# Patient Record
Sex: Female | Born: 2000 | Race: Black or African American | Hispanic: No | Marital: Single | Smoking: Never smoker
Health system: Southern US, Community
[De-identification: ages and names within clinical notes are randomized; demographics above are authoritative.]

## PROBLEM LIST (undated history)

## (undated) DIAGNOSIS — D649 Anemia, unspecified: Secondary | ICD-10-CM

## (undated) HISTORY — PX: TONSILLECTOMY: SUR1361

---

## 2020-03-06 DIAGNOSIS — Z1388 Encounter for screening for disorder due to exposure to contaminants: Secondary | ICD-10-CM | POA: Diagnosis not present

## 2020-03-06 DIAGNOSIS — Z331 Pregnant state, incidental: Secondary | ICD-10-CM | POA: Diagnosis not present

## 2020-03-06 DIAGNOSIS — Z0389 Encounter for observation for other suspected diseases and conditions ruled out: Secondary | ICD-10-CM | POA: Diagnosis not present

## 2020-03-06 DIAGNOSIS — Z049 Encounter for examination and observation for unspecified reason: Secondary | ICD-10-CM | POA: Diagnosis not present

## 2020-03-06 DIAGNOSIS — Z23 Encounter for immunization: Secondary | ICD-10-CM | POA: Diagnosis not present

## 2020-03-06 DIAGNOSIS — Z3009 Encounter for other general counseling and advice on contraception: Secondary | ICD-10-CM | POA: Diagnosis not present

## 2020-03-06 DIAGNOSIS — Z114 Encounter for screening for human immunodeficiency virus [HIV]: Secondary | ICD-10-CM | POA: Diagnosis not present

## 2020-03-31 ENCOUNTER — Encounter: Payer: Self-pay | Admitting: Family Medicine

## 2020-03-31 DIAGNOSIS — Z0289 Encounter for other administrative examinations: Secondary | ICD-10-CM | POA: Insufficient documentation

## 2020-04-01 ENCOUNTER — Other Ambulatory Visit: Payer: Self-pay

## 2020-04-01 ENCOUNTER — Encounter: Payer: Self-pay | Admitting: Family Medicine

## 2020-04-01 ENCOUNTER — Ambulatory Visit (INDEPENDENT_AMBULATORY_CARE_PROVIDER_SITE_OTHER): Payer: Medicaid Other | Admitting: Family Medicine

## 2020-04-01 VITALS — BP 100/64 | HR 69 | Ht 65.75 in | Wt 133.0 lb

## 2020-04-01 DIAGNOSIS — Z0289 Encounter for other administrative examinations: Secondary | ICD-10-CM

## 2020-04-01 NOTE — Progress Notes (Signed)
  Patient Name: Jacqueline Miranda Date of Birth: 11-25-2000 Date of Visit: 04/01/20 PCP: Evelena Leyden, DO  Chief Complaint: refugee intake examination and  toothache for >6 months, "left eye problem" (was previously seeing eye doctor and had corneal infection/inflammation that needed follow-up. She was was being treated with eye drops and tablet, but meds were changed every week.), leg (pain with standing or sitting for extended periods in both legs byt R>L)  The patient's preferred language is [place]. An interpreter was used for the entire visit.  Interpreter Name or ID: [id number]    Subjective: Jacqueline Miranda is a pleasant 19 y.o. presenting today for an initial refugee and immigrant clinic visit. She has no acute concerns today. She reports she failed her vision screen at the health department.    ROS: Negative other than HPI  PMH:  "Eye problem" Denies malaria or hospitalizations  PSH: Tonsillectomy    FH: Father has Hepatitis B  Allergies:  None  Current Medications:  None   Social History: Tobacco Use: Deferred for next exam Alcohol Use: Deferred for next exam  Refugee Information Number of Immediate Family Members: 8 Number of Immediate Family Members in [place]: 8 Date of Arrival: 02/12/20 Country of Birth: [place] Other Country of Origin:: [place] Location of Refugee Camp: Other Other Location of Refugee Camp:: [place] Duration in [place]: 11-15 years Reason for Leaving Home Country: Religion Primary Language: Other Other Primary Language:: [place] Able to Read in Primary Language: No Able to Write in Primary Language: No Education: Primary School Marital Status: Single Tuberculosis Screening Overseas: Negative Health Department Labs Completed: Yes Do You Feel Jumpy or Nervous?: No Are You Very Watchful or 'Super Alert'?: No Not able to read and write in preferred language. Read and write in English and arabic some.  Date of Overseas Exam: 08/15/2019 Review of Overseas Exam:  04/01/2020 Pre-Departure Treatment: Albendazole, PZQ, Ivermectin  Overseas Vaccines Reviewed and Updated in Epic 04/01/2020    Vitals:   04/01/20 1436  BP: 100/64  Pulse: 69  SpO2: 100%   HEENT: Sclera anicteric. Dentition is mildly poor with possible cavities. Appears well hydrated. Neck: Supple Cardiac: Regular rate and rhythm. Normal S1/S2. No murmurs, rubs, or gallops appreciated. Lungs: Clear bilaterally to ascultation.  Abdomen: Normoactive bowel sounds. No tenderness to deep or light palpation. No rebound or guarding. Splenomegaly not app  Extremities: Warm, well perfused without edema.  Skin: no obvious rashes or lesions  Psych: Pleasant and appropriate  MSK: no muscle wasting, appropriate muscular tone   Refugee health examination At next visit address: - Social history - "Eye problem", consider optometry referral - Leg pain - Toothache  Referral placed to Optometry today--April to call to schedule optometry appointment. Will discuss time and date at follow up (Mom to have appointment on same day) Labs pended for next visit- please make sure she obtains these Will need Pap smear in next few months   Music therapist signed with agency Yes.   Release of information signed for Health Department Yes.   Return to care in 1 month in [facility] with resident physician and PCP .   Vaccines: Updated  I discussed the plan of care with the resident physician and agree with below documentation.  Terisa Starr, MD

## 2020-04-01 NOTE — Patient Instructions (Addendum)
It was wonderful to see you today.  Please bring ALL of your medications with you to every visit.     Follow up on November 12th at 3PM    Thank you for choosing [facility].   Please call [phone] with any questions about today's appointment.  Please be sure to schedule follow up at the front  desk before you leave today.   Evelena Leyden, DO  Family Medicine

## 2020-04-01 NOTE — Assessment & Plan Note (Signed)
At next visit address: - Social history - "Eye problem", consider optometry referral - Leg pain - Toothache

## 2020-04-11 ENCOUNTER — Ambulatory Visit: Payer: Self-pay | Admitting: Family Medicine

## 2020-04-11 ENCOUNTER — Telehealth: Payer: Self-pay | Admitting: Family Medicine

## 2020-04-11 NOTE — Telephone Encounter (Signed)
Attempted to call patient with a phone interpreter. Interpreter was getting an error message on the phone numbers [phone], [phone], and [phone]. Spoke with Dr. Brown who is reaching out to the case manager to have the patient's confirm an appropriate phone number and reschedule for refugee labs and follow-up. ° °Jacqueline Kernodle, DO ° °

## 2020-04-21 ENCOUNTER — Ambulatory Visit (INDEPENDENT_AMBULATORY_CARE_PROVIDER_SITE_OTHER): Payer: Medicaid Other | Admitting: Family Medicine

## 2020-04-21 ENCOUNTER — Other Ambulatory Visit: Payer: Self-pay | Admitting: Family Medicine

## 2020-04-21 ENCOUNTER — Other Ambulatory Visit (HOSPITAL_COMMUNITY): Payer: Self-pay | Admitting: Family Medicine

## 2020-04-21 ENCOUNTER — Other Ambulatory Visit: Payer: Self-pay

## 2020-04-21 ENCOUNTER — Encounter: Payer: Self-pay | Admitting: Family Medicine

## 2020-04-21 VITALS — BP 106/72 | HR 100 | Ht 66.0 in | Wt 135.0 lb

## 2020-04-21 DIAGNOSIS — H538 Other visual disturbances: Secondary | ICD-10-CM | POA: Diagnosis not present

## 2020-04-21 DIAGNOSIS — M79604 Pain in right leg: Secondary | ICD-10-CM | POA: Diagnosis not present

## 2020-04-21 DIAGNOSIS — H539 Unspecified visual disturbance: Secondary | ICD-10-CM | POA: Insufficient documentation

## 2020-04-21 DIAGNOSIS — K0889 Other specified disorders of teeth and supporting structures: Secondary | ICD-10-CM | POA: Insufficient documentation

## 2020-04-21 DIAGNOSIS — M79605 Pain in left leg: Secondary | ICD-10-CM | POA: Diagnosis not present

## 2020-04-21 DIAGNOSIS — Z3490 Encounter for supervision of normal pregnancy, unspecified, unspecified trimester: Secondary | ICD-10-CM | POA: Diagnosis not present

## 2020-04-21 DIAGNOSIS — Z3400 Encounter for supervision of normal first pregnancy, unspecified trimester: Secondary | ICD-10-CM | POA: Insufficient documentation

## 2020-04-21 DIAGNOSIS — Z0289 Encounter for other administrative examinations: Secondary | ICD-10-CM

## 2020-04-21 HISTORY — DX: Other specified disorders of teeth and supporting structures: K08.89

## 2020-04-21 LAB — POCT URINALYSIS DIP (MANUAL ENTRY)
Bilirubin, UA: NEGATIVE
Blood, UA: NEGATIVE
Glucose, UA: 100 mg/dL — AB
Ketones, POC UA: NEGATIVE mg/dL
Nitrite, UA: NEGATIVE
Protein Ur, POC: NEGATIVE mg/dL
Spec Grav, UA: 1.02 (ref 1.010–1.025)
Urobilinogen, UA: 0.2 E.U./dL
pH, UA: 5.5 (ref 5.0–8.0)

## 2020-04-21 LAB — POCT URINE PREGNANCY: Preg Test, Ur: POSITIVE — AB

## 2020-04-21 LAB — POCT UA - MICROSCOPIC ONLY: Epithelial cells, urine per micros: >20

## 2020-04-21 MED ORDER — PRENATAL COMPLETE 14-0.4 MG PO TABS: 1.0000 | ORAL_TABLET | Freq: Every day | ORAL | 5 refills | Status: DC

## 2020-04-21 NOTE — Assessment & Plan Note (Signed)
>>  ASSESSMENT AND PLAN FOR BLURRED VISION WRITTEN ON 04/21/2020  4:01 PM BY Bud Care, DO  History of blurred vision since August.  Patient took unknown eyedrops and oral medications. -Patient given optometry resources.

## 2020-04-21 NOTE — Assessment & Plan Note (Signed)
Likely MSK in origin, with no decrease in ROM of bilateral LE.  Possible diagnoses include piriformis syndrome, gluteal weakness, SI joint dysfunction. -We will need more follow-up at next visit.  Unable to delve into further considerations with patient. -Consider further in-depth MSK evaluation with gait testing as well. -Follow-up within the next month.

## 2020-04-21 NOTE — Patient Instructions (Addendum)
It was so great seeing you today!  Today we discussed the following:  Leg pain: For your leg pain, we will have you come back within the next month to determine if there are exercises or other treatments that would be appropriate.  Vision problems: Very blurred vision, we have given you an optometrist list to have your eyes evaluated.  Toothache: We have provided you with a list of dentists that take Medicaid.  Positive pregnancy test: Your pregnancy test was positive today.  Ultrasound confirmed that there is a fetus present.  We have set you up with an ultrasound to determine how far along you are on December 3.  We have also given you the resources requested for consideration of termination.  In [place] termination cannot be completed after 20 weeks, and will depend on what the dating ultrasound to determine the options.  I want to see you back in the office for your appointment on December 9 at 2:30 PM to check up and see how everything is going.  We are prescribing a prenatal vitamin, please take this every single day.

## 2020-04-21 NOTE — Assessment & Plan Note (Signed)
LMP in August 21.  Unexpected pregnancy, patient voices desire for termination if possible.  Family unaware of the situation, patient states she will not be telling them.  Intrauterine gestation confirmed with office [place], fundal height below the level of the umbilicus. -Prescribed daily prenatal vitamin -Dating ultrasound appointment 12/3 -Patient given information for contact for dating ultrasound and termination services per request -Family unaware, patient currently wishes for them to remain so

## 2020-04-21 NOTE — Assessment & Plan Note (Signed)
Getting labs today as they were unable to at prior appointment.

## 2020-04-21 NOTE — Progress Notes (Signed)
SUBJECTIVE:   Interpreter [id number]  CHIEF COMPLAINT / HPI:   Phone number [phone].  Refugee follow-up: Labs to be collected today as patient was unable to obtain these labs at prior visit.  Leg pain: Patient reports 3 years of bilateral leg pain (more in the right) that occurs most days.  She describes it as a "stretching pain" that occurs whenever she is walking or doing work for more than 15 minutes.  Patient has never seek treatment for this and does not take any OTC medications.  Patient reports that it does limit her walking.  Toothache: Patient has a prior history of cavities with fillings 2 years ago.  Has not seen a dentist since she is arrived to the country, was previously seen in [place].  Vision problems: Patient reports some blurry vision that began in August.  Patient was previously taking unknown eyedrops and a reported oral medication that helped the blurriness for 1 month while in [place].  She is unsure what her medications were.    Positive pregnancy test: While in the room with CMA and sister, patient stated that her last period was on 11/10.  Once alone in the room, patient stated that her last period was in August (she states she typically has monthly cycles), though denied any sexual intercourse.  Patient urinary pregnancy test came back positive, then patient admitted to having prior relations while still living in [place].  She states her only sexual partner is still living in [place].  Her family is unaware of her situation, she was nervous that she could possibly have been pregnant but had not been tested yet.  She states that she has been having issues with nausea and vomiting, denies vaginal bleeding leakage of fluid, contractions/cramping patient states that the pregnancy was not wanted, and she thinks that she would like to have termination if possible.   PERTINENT  PMH / PSH: Reviewed  OBJECTIVE:   BP 106/72   Pulse 100   Ht 5\' 6"  (1.676 m)   Wt 135 lb  (61.2 kg)   LMP August 2021  SpO2 99%   BMI 21.79 kg/m   Gen: NAD, initially sitting in the room with sister. HEENT: Poor dentition, caries noted, prior fillings noted. MSK: B/L LE: Full ROM, mild knee pain with internal rotation of hip, no tenderness to palpation, no crepitus appreciated in joints. Abdomen: Fundal height right below the level of the umbilicus, point-of-care ultrasound confirmed intrauterine gestation.  ASSESSMENT/PLAN:   Refugee health examination Getting labs today as they were unable to at prior appointment.  Pregnancy LMP in August 21.  Unexpected pregnancy, patient voices desire for termination if possible.  Family unaware of the situation, patient states she will not be telling them.  Intrauterine gestation confirmed with office August 23, fundal height below the level of the umbilicus. -Prescribed daily prenatal vitamin -Dating ultrasound appointment 12/3 -Patient given information for contact for dating ultrasound and termination services per request -Family unaware, patient currently wishes for them to remain so  Toothache History of dental caries with fillings.  Patient was given a list of local dentist that take Medicaid.  Leg pain, bilateral Likely MSK in origin, with no decrease in ROM of bilateral LE.  Possible diagnoses include piriformis syndrome, gluteal weakness, SI joint dysfunction. -We will need more follow-up at next visit.  Unable to delve into further considerations with patient. -Consider further in-depth MSK evaluation with gait testing as well. -Follow-up within the next month.  Blurred vision History  of blurred vision since August.  Patient took unknown eyedrops and oral medications. -Patient given optometry resources.     Jacqueline Leyden, DO [facility]

## 2020-04-21 NOTE — Assessment & Plan Note (Signed)
History of blurred vision since August.  Patient took unknown eyedrops and oral medications. -Patient given optometry resources.

## 2020-04-21 NOTE — Assessment & Plan Note (Signed)
History of dental caries with fillings.  Patient was given a list of local dentist that take Medicaid.

## 2020-04-22 LAB — COMPREHENSIVE METABOLIC PANEL
ALT: 24 IU/L (ref 0–32)
AST: 30 IU/L (ref 0–40)
Albumin/Globulin Ratio: 1.4 (ref 1.2–2.2)
Albumin: 4.2 g/dL (ref 3.9–5.0)
Alkaline Phosphatase: 89 IU/L (ref 44–121)
BUN/Creatinine Ratio: 11 (ref 9–23)
BUN: 6 mg/dL (ref 6–20)
Bilirubin Total: 0.3 mg/dL (ref 0.0–1.2)
CO2: 20 mmol/L (ref 20–29)
Calcium: 9.6 mg/dL (ref 8.7–10.2)
Chloride: 100 mmol/L (ref 96–106)
Creatinine, Ser: 0.53 mg/dL — ABNORMAL LOW (ref 0.57–1.00)
GFR calc Af Amer: 157 mL/min/{1.73_m2} (ref >59)
GFR calc non Af Amer: 136 mL/min/{1.73_m2} (ref >59)
Globulin, Total: 3 g/dL (ref 1.5–4.5)
Glucose: 78 mg/dL (ref 65–99)
Potassium: 4.4 mmol/L (ref 3.5–5.2)
Sodium: 135 mmol/L (ref 134–144)
Total Protein: 7.2 g/dL (ref 6.0–8.5)

## 2020-04-22 LAB — CBC WITH DIFFERENTIAL/PLATELET
Basophils Absolute: 0 10*3/uL (ref 0.0–0.2)
Basos: 0 %
EOS (ABSOLUTE): 0.7 10*3/uL — ABNORMAL HIGH (ref 0.0–0.4)
Eos: 6 %
Hematocrit: 30.7 % — ABNORMAL LOW (ref 34.0–46.6)
Hemoglobin: 10.6 g/dL — ABNORMAL LOW (ref 11.1–15.9)
Immature Grans (Abs): 0.1 10*3/uL (ref 0.0–0.1)
Immature Granulocytes: 1 %
Lymphocytes Absolute: 2 10*3/uL (ref 0.7–3.1)
Lymphs: 17 %
MCH: 27.4 pg (ref 26.6–33.0)
MCHC: 34.5 g/dL (ref 31.5–35.7)
MCV: 79 fL (ref 79–97)
Monocytes Absolute: 1.1 10*3/uL — ABNORMAL HIGH (ref 0.1–0.9)
Monocytes: 9 %
Neutrophils Absolute: 7.7 10*3/uL — ABNORMAL HIGH (ref 1.4–7.0)
Neutrophils: 67 %
Platelets: 287 10*3/uL (ref 150–450)
RBC: 3.87 x10E6/uL (ref 3.77–5.28)
RDW: 14.9 % (ref 11.7–15.4)
WBC: 11.6 10*3/uL — ABNORMAL HIGH (ref 3.4–10.8)

## 2020-04-22 LAB — HEPATITIS B SURFACE ANTIGEN: Hepatitis B Surface Ag: NEGATIVE

## 2020-04-22 LAB — HIV ANTIBODY (ROUTINE TESTING W REFLEX): HIV Screen 4th Generation wRfx: NONREACTIVE

## 2020-04-22 LAB — HEPATITIS B CORE ANTIBODY, TOTAL: Hep B Core Total Ab: POSITIVE — AB

## 2020-04-22 LAB — LIPID PANEL
Chol/HDL Ratio: 2.2 ratio (ref 0.0–4.4)
Cholesterol, Total: 184 mg/dL (ref 100–199)
HDL: 84 mg/dL (ref >39)
LDL Chol Calc (NIH): 83 mg/dL (ref 0–99)
Triglycerides: 99 mg/dL (ref 0–149)
VLDL Cholesterol Cal: 17 mg/dL (ref 5–40)

## 2020-04-22 LAB — HEPATITIS B SURFACE ANTIBODY, QUANTITATIVE: Hepatitis B Surf Ab Quant: 486.5 m[IU]/mL (ref >9.9)

## 2020-04-22 LAB — HCV INTERPRETATION

## 2020-04-22 LAB — VARICELLA ZOSTER ANTIBODY, IGG: Varicella zoster IgG: 3347 index (ref >165)

## 2020-04-22 LAB — HCV AB W REFLEX TO QUANT PCR: HCV Ab: <0.1 s/co ratio (ref 0.0–0.9)

## 2020-04-22 LAB — RPR: RPR Ser Ql: NONREACTIVE

## 2020-04-22 LAB — TSH: TSH: 1.65 u[IU]/mL (ref 0.450–4.500)

## 2020-05-02 ENCOUNTER — Other Ambulatory Visit: Payer: Self-pay | Admitting: *Deleted

## 2020-05-02 ENCOUNTER — Other Ambulatory Visit: Payer: Self-pay

## 2020-05-02 ENCOUNTER — Other Ambulatory Visit: Payer: Self-pay | Admitting: Family Medicine

## 2020-05-02 ENCOUNTER — Ambulatory Visit
Admission: RE | Admit: 2020-05-02 | Discharge: 2020-05-02 | Disposition: A | Discharge status: Home / Self Care | Payer: Medicaid Other | Source: Ambulatory Visit | Attending: Family Medicine | Admitting: Family Medicine

## 2020-05-02 ENCOUNTER — Telehealth: Payer: Self-pay | Admitting: Family Medicine

## 2020-05-02 ENCOUNTER — Ambulatory Visit (HOSPITAL_BASED_OUTPATIENT_CLINIC_OR_DEPARTMENT_OTHER): Payer: Medicaid Other

## 2020-05-02 DIAGNOSIS — Z363 Encounter for antenatal screening for malformations: Secondary | ICD-10-CM

## 2020-05-02 DIAGNOSIS — F5089 Other specified eating disorder: Secondary | ICD-10-CM

## 2020-05-02 DIAGNOSIS — Z3687 Encounter for antenatal screening for uncertain dates: Secondary | ICD-10-CM | POA: Diagnosis not present

## 2020-05-02 DIAGNOSIS — Z3A18 18 weeks gestation of pregnancy: Secondary | ICD-10-CM

## 2020-05-02 DIAGNOSIS — Z3490 Encounter for supervision of normal pregnancy, unspecified, unspecified trimester: Secondary | ICD-10-CM | POA: Diagnosis not present

## 2020-05-02 DIAGNOSIS — O0930 Supervision of pregnancy with insufficient antenatal care, unspecified trimester: Secondary | ICD-10-CM

## 2020-05-02 IMAGING — US US MFM OB COMP +14 WKS
2 series · 13 of 28 positions shown · non-contrast
Comparison: none

[Series 1: us mfm ob comp +14 wks · 12 of 102 slices shown (1 of 2)]
[im 5/102]
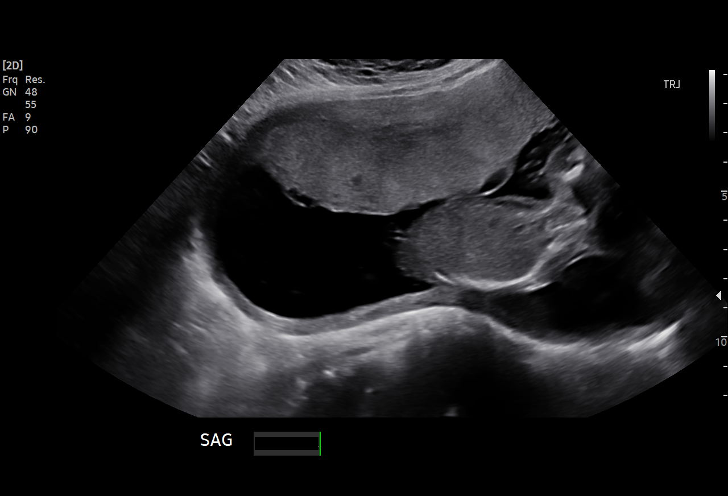
[im 13/102]
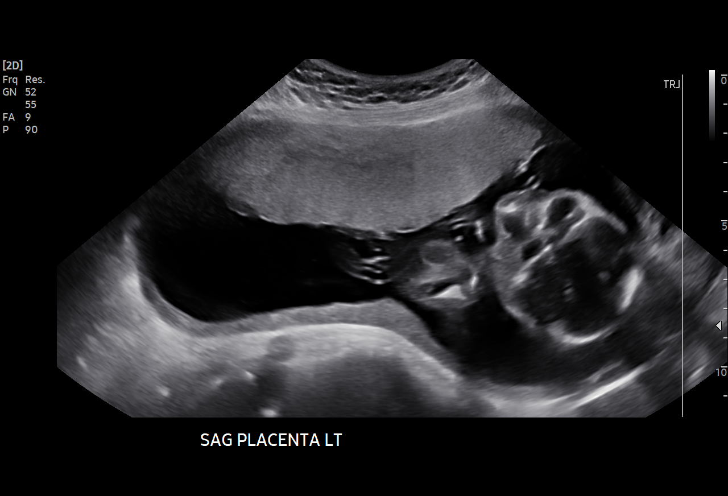
[im 21/102]
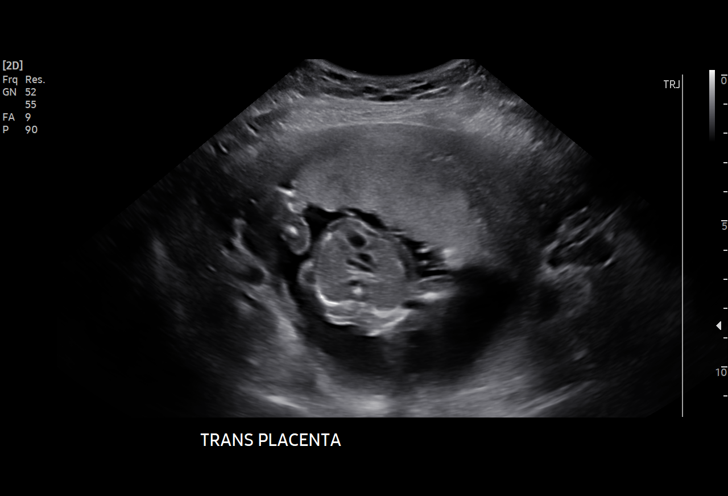
[im 29/102]
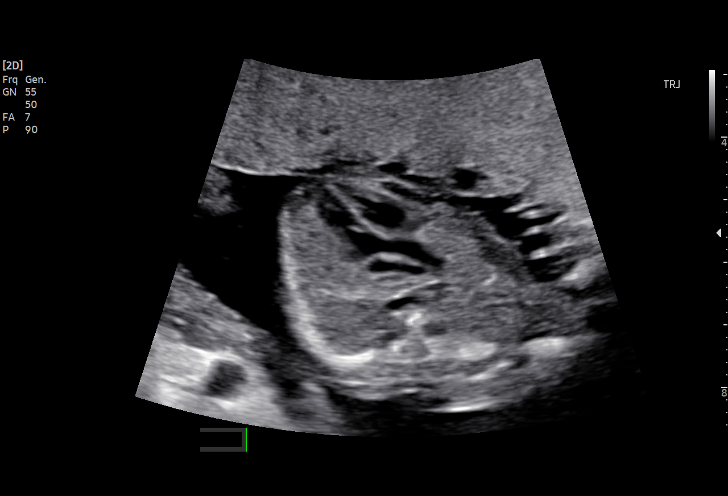
[im 37/102]
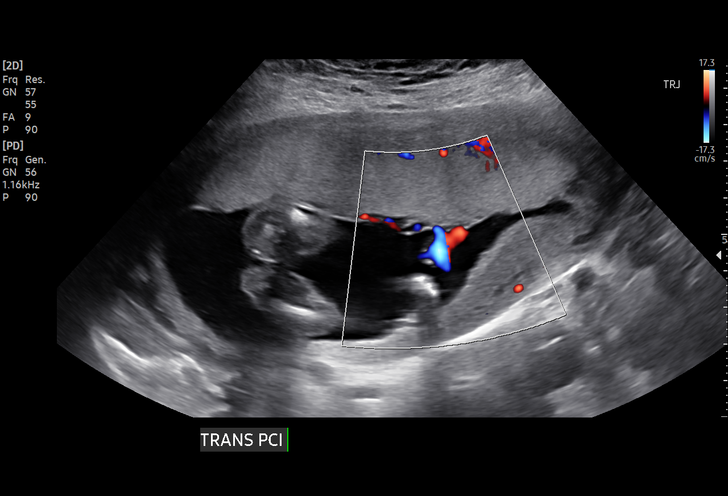
[im 45/102]
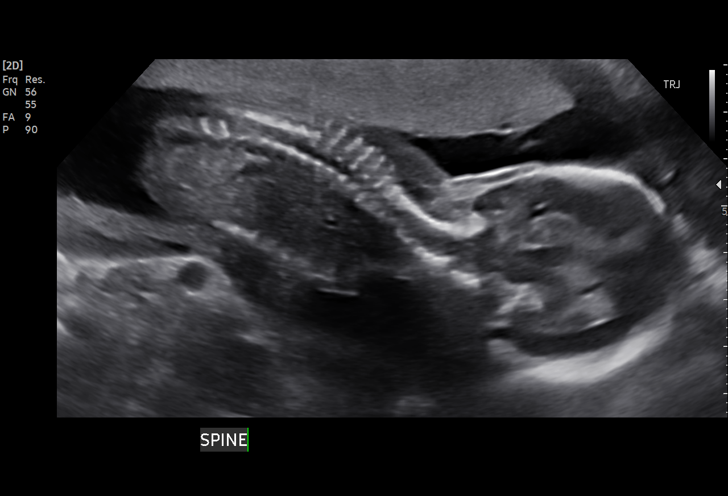
[im 57/102]
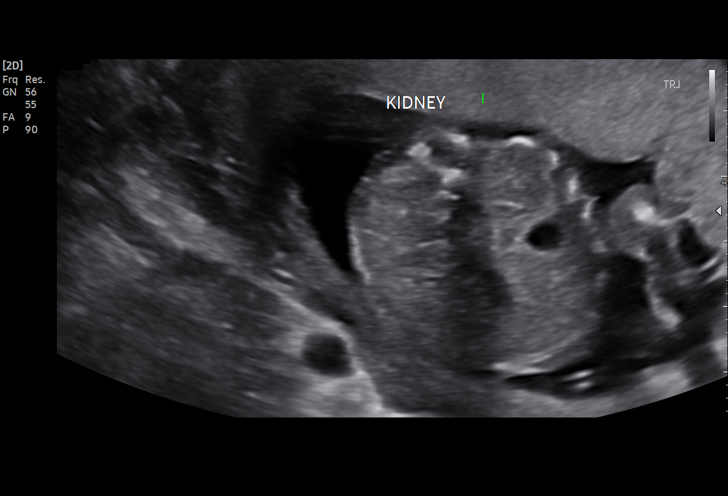
[im 65/102]
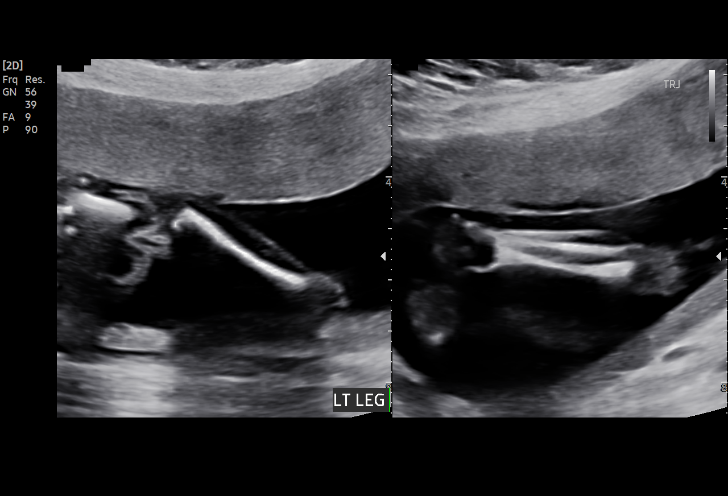
[im 73/102]
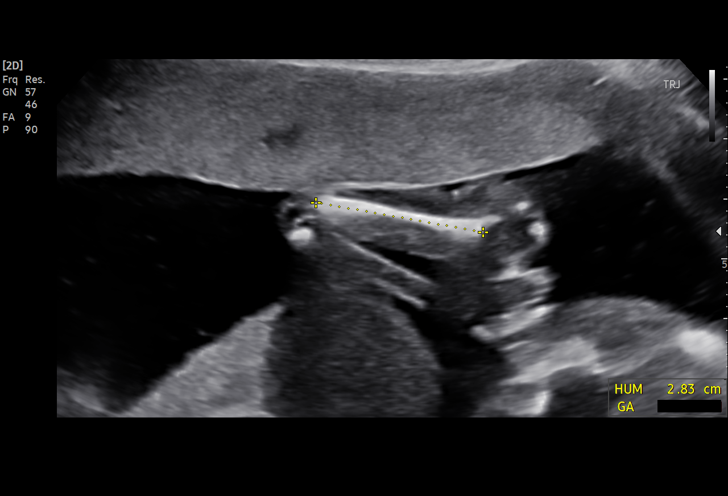
[im 81/102]
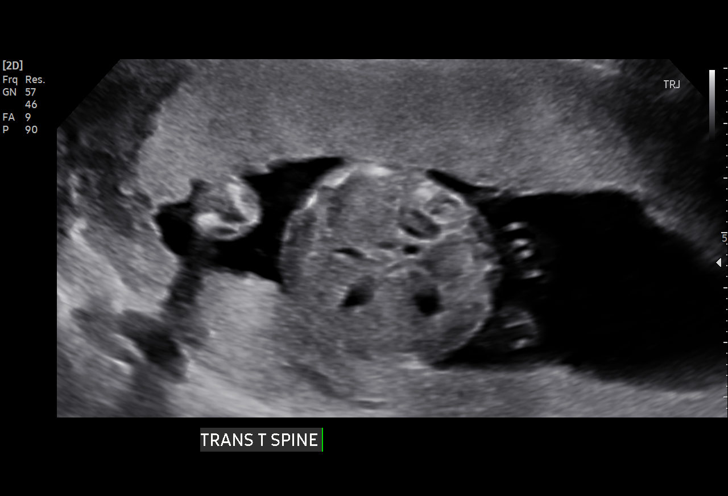
[im 89/102]
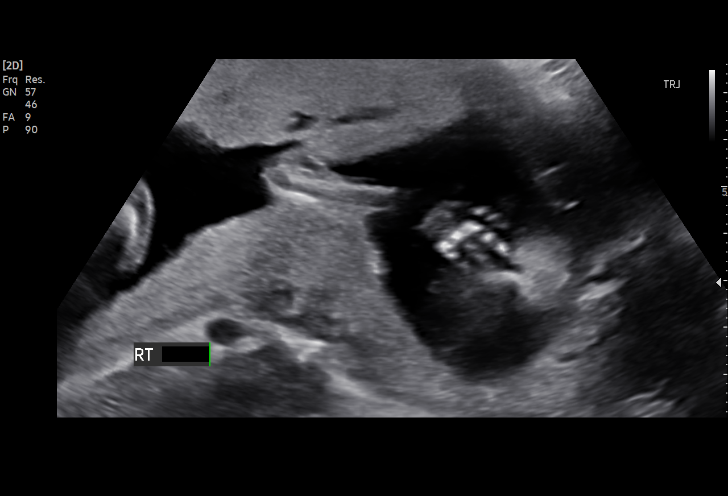
[im 97/102]
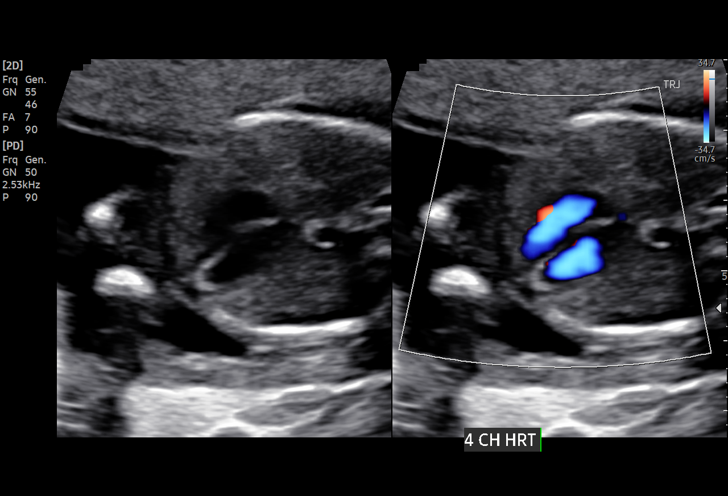

[Series 1: us mfm ob comp +14 wks · 1 of 7 slices shown (2 of 2)]
[im 1/7]
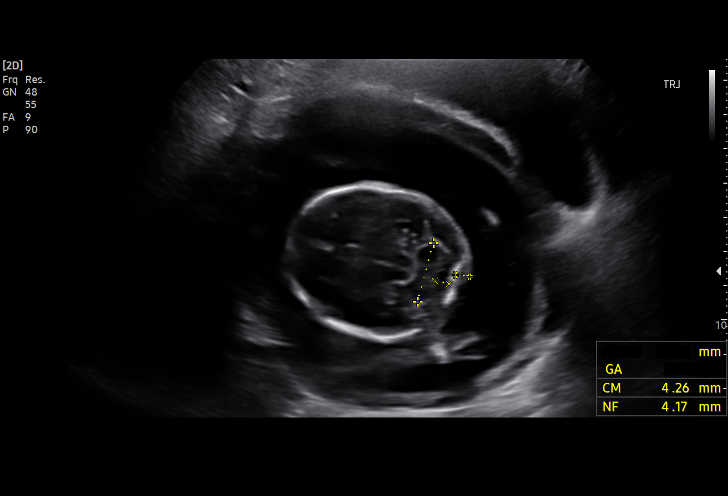

[13 of 28 positions shown; findings below may reference images not displayed]

Indications

 18 weeks gestation of pregnancy
 Encounter for antenatal screening for          [4R]
 malformations
 Encounter for uncertain dates                  [4R]
Fetal Evaluation

 Num Of Fetuses:         1
 Cardiac Activity:       Observed
 Presentation:           Cephalic
 Placenta:               Anterior
 P. Cord Insertion:      Visualized, central

 Amniotic Fluid
 AFI FV:      Within normal limits
Biometry

 BPD:      41.8  mm     G. Age:  18w 5d         55  %    CI:         72.7   %    70 - 86
                                                         FL/HC:      18.5   %    16.1 -
 HC:      155.9  mm     G. Age:  18w 4d         39  %    HC/AC:      1.27        1.09 -
 AC:      122.8  mm     G. Age:  18w 0d         26  %    FL/BPD:     68.9   %
 FL:       28.8  mm     G. Age:  18w 6d         54  %    FL/AC:      23.5   %    20 - 24
 HUM:      28.5  mm     G. Age:  19w 1d         70  %
 NFT:       4.2  mm
 LV:        8.6  mm
 CM:        4.3  mm

 Est. FW:     238  gm      0 lb 8 oz     35  %
OB History
 Gravidity:    1
 Living:       0
Gestational Age

 LMP:           17w 0d        Date:  [DATE]                 EDD:   [DATE]
 U/S Today:     18w 4d                                        EDD:   [DATE]
 Best:          18w 4d     Det. By:  U/S ([DATE])           EDD:   [DATE]
Anatomy

 Cranium:               Appears normal         Aortic Arch:            Appears normal
 Cavum:                 Not well visualized    Ductal Arch:            Appears normal
 Ventricles:            Appears normal         Diaphragm:              Appears normal
 Choroid Plexus:        Appears normal         Stomach:                Appears normal, left
                                                                       sided
 Cerebellum:            Appears normal         Abdomen:                Appears normal
 Posterior Fossa:       Not well visualized    Abdominal Wall:         Appears nml (cord
                                                                       insert, abd wall)
 Nuchal Fold:           Not well visualized    Cord Vessels:           Appears normal (3
                                                                       vessel cord)
 Face:                  Not well visualized    Kidneys:                Appear normal
 Lips:                  Not well visualized    Bladder:                Appears normal
 Heart:                 Appears normal         Spine:                  Appears normal
                        (4CH, axis, and
                        situs)
 RVOT:                  Appears normal         Upper Extremities:      Appears normal
 LVOT:                  Appears normal         Lower Extremities:      Appears normal

 Other:  Fetus appears to be female. Heels visualized x2. Lenses visualized.
         Open hands visualized.
Cervix Uterus Adnexa

 Cervix
 Length:            3.2  cm.

 Right Ovary
 Size(cm)       2.9  x   1.6    x  1.9       Vol(ml):
 Within normal limits.

 Left Ovary
 Size(cm)       4.1  x   1.7    x  2.3       Vol(ml):
 Within normal limits.
Impression

 G1 P0.  Patient recently emigrated from [place] and he is
 here for pregnancy dating scan.  She reports no chronic
 medical conditions.  She had initiated prenatal care recently
 at [REDACTED].
 She has not had screening for aneuploidies.

 We performed fetal anatomy scan. No makers of
 aneuploidies or fetal structural defects are seen. Fetal
 biometry is consistent with 18 weeks and 4 days.  Amniotic
 fluid is normal and good fetal activity is seen. Patient
 understands the limitations of ultrasound in detecting fetal
 anomalies.
 I reassured the patient of the findings.  We have assigned her
 EDD at [DATE].
Recommendations

 -An appointment was made for her to return in 4 weeks for
 completion of fetal anatomy.
                 [id number]

## 2020-05-06 NOTE — Telephone Encounter (Signed)
Interpreter used during call: [phone]  Called patient concerning her desire for termination of her pregnancy. Patient's dating ultrasound showed the gestational age as [redacted]w[redacted]d on 12/3, patient is currently [redacted]w[redacted]d. Patient states that she now desires to continue and move forward with the pregnancy. She has an appointment scheduled with me on 12/9, at which time we will do an initial OB visit. Patient has not yet told her family about her pregnancy.  Jaquille Kau, DO

## 2020-05-08 ENCOUNTER — Ambulatory Visit (INDEPENDENT_AMBULATORY_CARE_PROVIDER_SITE_OTHER): Payer: Medicaid Other | Admitting: Family Medicine

## 2020-05-08 ENCOUNTER — Other Ambulatory Visit: Payer: Self-pay

## 2020-05-08 ENCOUNTER — Encounter: Payer: Self-pay | Admitting: Family Medicine

## 2020-05-08 VITALS — BP 100/56 | HR 75 | Ht 66.0 in | Wt 137.2 lb

## 2020-05-08 DIAGNOSIS — Z3A19 19 weeks gestation of pregnancy: Secondary | ICD-10-CM | POA: Diagnosis not present

## 2020-05-08 DIAGNOSIS — Z3402 Encounter for supervision of normal first pregnancy, second trimester: Secondary | ICD-10-CM

## 2020-05-08 NOTE — Patient Instructions (Addendum)
Continue to take your pre-natal vitamin, you will have an appointment with our office in 4 weeks.     Second Trimester of Pregnancy The second trimester is from week 14 through week 27 (months 4 through 6). The second trimester is often a time when you feel your best. Your body has adjusted to being pregnant, and you begin to feel better physically. Usually, morning sickness has lessened or quit completely, you may have more energy, and you may have an increase in appetite. The second trimester is also a time when the fetus is growing rapidly. At the end of the sixth month, the fetus is about 9 inches long and weighs about 1 pounds. You will likely begin to feel the baby move (quickening) between 16 and 20 weeks of pregnancy. Body changes during your second trimester Your body continues to go through many changes during your second trimester. The changes vary from woman to woman.  Your weight will continue to increase. You will notice your lower abdomen bulging out.  You may begin to get stretch marks on your hips, abdomen, and breasts.  You may develop headaches that can be relieved by medicines. The medicines should be approved by your health care provider.  You may urinate more often because the fetus is pressing on your bladder.  You may develop or continue to have heartburn as a result of your pregnancy.  You may develop constipation because certain hormones are causing the muscles that push waste through your intestines to slow down.  You may develop hemorrhoids or swollen, bulging veins (varicose veins).  You may have back pain. This is caused by: ? Weight gain. ? Pregnancy hormones that are relaxing the joints in your pelvis. ? A shift in weight and the muscles that support your balance.  Your breasts will continue to grow and they will continue to become tender.  Your gums may bleed and may be sensitive to brushing and flossing.  Dark spots or blotches (chloasma, mask of  pregnancy) may develop on your face. This will likely fade after the baby is born.  A dark line from your belly button to the pubic area (linea nigra) may appear. This will likely fade after the baby is born.  You may have changes in your hair. These can include thickening of your hair, rapid growth, and changes in texture. Some women also have hair loss during or after pregnancy, or hair that feels dry or thin. Your hair will most likely return to normal after your baby is born. What to expect at prenatal visits During a routine prenatal visit:  You will be weighed to make sure you and the fetus are growing normally.  Your blood pressure will be taken.  Your abdomen will be measured to track your baby's growth.  The fetal heartbeat will be listened to.  Any test results from the previous visit will be discussed. Your health care provider may ask you:  How you are feeling.  If you are feeling the baby move.  If you have had any abnormal symptoms, such as leaking fluid, bleeding, severe headaches, or abdominal cramping.  If you are using any tobacco products, including cigarettes, chewing tobacco, and electronic cigarettes.  If you have any questions. Other tests that may be performed during your second trimester include:  Blood tests that check for: ? Low iron levels (anemia). ? High blood sugar that affects pregnant women (gestational diabetes) between 27 and 28 weeks. ? Rh antibodies. This is to check  for a protein on red blood cells (Rh factor).  Urine tests to check for infections, diabetes, or protein in the urine.  An ultrasound to confirm the proper growth and development of the baby.  An amniocentesis to check for possible genetic problems.  Fetal screens for spina bifida and Down syndrome.  HIV (human immunodeficiency virus) testing. Routine prenatal testing includes screening for HIV, unless you choose not to have this test. Follow these instructions at  home: Medicines  Follow your health care provider's instructions regarding medicine use. Specific medicines may be either safe or unsafe to take during pregnancy.  Take a prenatal vitamin that contains at least 600 micrograms (mcg) of folic acid.  If you develop constipation, try taking a stool softener if your health care provider approves. Eating and drinking   Eat a balanced diet that includes fresh fruits and vegetables, whole grains, good sources of protein such as meat, eggs, or tofu, and low-fat dairy. Your health care provider will help you determine the amount of weight gain that is right for you.  Avoid raw meat and uncooked cheese. These carry germs that can cause birth defects in the baby.  If you have low calcium intake from food, talk to your health care provider about whether you should take a daily calcium supplement.  Limit foods that are high in fat and processed sugars, such as fried and sweet foods.  To prevent constipation: ? Drink enough fluid to keep your urine clear or pale yellow. ? Eat foods that are high in fiber, such as fresh fruits and vegetables, whole grains, and beans. Activity  Exercise only as directed by your health care provider. Most women can continue their usual exercise routine during pregnancy. Try to exercise for 30 minutes at least 5 days a week. Stop exercising if you experience uterine contractions.  Avoid heavy lifting, wear low heel shoes, and practice good posture.  A sexual relationship may be continued unless your health care provider directs you otherwise. Relieving pain and discomfort  Wear a good support bra to prevent discomfort from breast tenderness.  Take warm sitz baths to soothe any pain or discomfort caused by hemorrhoids. Use hemorrhoid cream if your health care provider approves.  Rest with your legs elevated if you have leg cramps or low back pain.  If you develop varicose veins, wear support hose. Elevate your feet  for 15 minutes, 3-4 times a day. Limit salt in your diet. Prenatal Care  Write down your questions. Take them to your prenatal visits.  Keep all your prenatal visits as told by your health care provider. This is important. Safety  Wear your seat belt at all times when driving.  Make a list of emergency phone numbers, including numbers for family, friends, the hospital, and police and fire departments. General instructions  Ask your health care provider for a referral to a local prenatal education class. Begin classes no later than the beginning of month 6 of your pregnancy.  Ask for help if you have counseling or nutritional needs during pregnancy. Your health care provider can offer advice or refer you to specialists for help with various needs.  Do not use hot tubs, steam rooms, or saunas.  Do not douche or use tampons or scented sanitary pads.  Do not cross your legs for long periods of time.  Avoid cat litter boxes and soil used by cats. These carry germs that can cause birth defects in the baby and possibly loss of the fetus  by miscarriage or stillbirth.  Avoid all smoking, herbs, alcohol, and unprescribed drugs. Chemicals in these products can affect the formation and growth of the baby.  Do not use any products that contain nicotine or tobacco, such as cigarettes and e-cigarettes. If you need help quitting, ask your health care provider.  Visit your dentist if you have not gone yet during your pregnancy. Use a soft toothbrush to brush your teeth and be gentle when you floss. Contact a health care provider if:  You have dizziness.  You have mild pelvic cramps, pelvic pressure, or nagging pain in the abdominal area.  You have persistent nausea, vomiting, or diarrhea.  You have a bad smelling vaginal discharge.  You have pain when you urinate. Get help right away if:  You have a fever.  You are leaking fluid from your vagina.  You have spotting or bleeding from your  vagina.  You have severe abdominal cramping or pain.  You have rapid weight gain or weight loss.  You have shortness of breath with chest pain.  You notice sudden or extreme swelling of your face, hands, ankles, feet, or legs.  You have not felt your baby move in over an hour.  You have severe headaches that do not go away when you take medicine.  You have vision changes. Summary  The second trimester is from week 14 through week 27 (months 4 through 6). It is also a time when the fetus is growing rapidly.  Your body goes through many changes during pregnancy. The changes vary from woman to woman.  Avoid all smoking, herbs, alcohol, and unprescribed drugs. These chemicals affect the formation and growth your baby.  Do not use any tobacco products, such as cigarettes, chewing tobacco, and e-cigarettes. If you need help quitting, ask your health care provider.  Contact your health care provider if you have any questions. Keep all prenatal visits as told by your health care provider. This is important. This information is not intended to replace advice given to you by your health care provider. Make sure you discuss any questions you have with your health care provider. Document Revised: 09/08/2018 Document Reviewed: 06/22/2016 Elsevier Patient Education  2020 [employer].

## 2020-05-08 NOTE — Progress Notes (Signed)
Patient Name: Jacqueline Miranda Date of Birth: 05-04-2001 [facility] Initial Prenatal Visit  A video interpreter was used for this encounter #[id number]  Jacqueline Miranda is a 19 y.o. year old G1P0 at [redacted]w[redacted]d who presents for her initial prenatal visit. Pregnancy is not planned She reports mild cramping. She is taking a prenatal vitamin.  She denies pelvic pain or vaginal bleeding.   Pregnancy Dating: . The patient is dated by [place].  Marland Kitchen LMP: August 2021 . Period is certain:  No.  . Periods were regular:  Yes.  Marland Kitchen LMP was a typical period:  Yes.  . Using hormonal contraception in 3 months prior to conception: No  Lab Review:  Patient late to care, labs collected at this visit to be reviewed.   PMH: Reviewed and as detailed below: . HTN: No  . Gestational Hypertension/preeclampsia: No  . Type 1 or 2 Diabetes: No  . Depression:  No  . Seizure disorder:  No . VTE: No ,  . History of STI No,  . Abnormal Pap smear: Never completed  . Genital herpes simplex:  No   PSH: . Gynecologic Surgery:  no . Surgical history reviewed, notable for: None  Obstetric History: Marland Kitchen G1P0   Social History: . Partner's name: Jacqueline Miranda  . Tobacco use: No . Alcohol use:  No . Other substance use:  No  Current Medications:  . Pre-natal vitamin  . Reviewed and appropriate in pregnancy.   Genetic and Infection Screen: . Flow Sheet Updated Yes  Prenatal Exam: Gen: Well nourished, well developed.  No distress.  Vitals noted. HEENT: Normocephalic, atraumatic.  Neck supple without cervical lymphadenopathy, thyromegaly or thyroid nodules.  Fair dentition. CV: RRR no murmur, gallops or rubs Lungs: CTA B.  Normal respiratory effort without wheezes or rales. Abd: soft, NTND. +BS.  Uterus below the level of the umbilicus Ext: No clubbing, cyanosis or edema. Psych: Normal grooming and dress.  Not depressed or anxious appearing.  Normal thought content and process without flight of ideas or looseness of  associations  Fetal heart tones: Appropriate  Assessment/Plan:  Jacqueline Miranda is a 19 y.o. G1P0 at [redacted]w[redacted]d who presents to initiate prenatal care. She is doing well.  Current pregnancy issues include Language barrier.  1. Routine prenatal care: Marland Kitchen As dating is reliable, a dating ultrasound was completed  . Expected weight gain this pregnancy is 25-35 pounds  . Prenatal labs ordered.  . Medication list reviewed and updated.  . Recommended patient see a dentist for regular care.  . Bleeding and pain precautions reviewed. . Importance of prenatal vitamins reviewed.  . Genetic screening offered. Patient opted for: quad screen at 16-20 weeks. . The patient does not have an indication for aspirin therapy beginning at 12-16 weeks. Aspirin was not  recommended today.  . The patient will not be age 79 or over at time of delivery. Referral to genetic counseling was not offered today.  . The patient has the following risk factors for preexisting diabetes: Reviewed indications for early 1 hour glucose testing, not indicated . An early 1 hour glucose tolerance test was not ordered. . Pregnancy Medical Home and PHQ-9 forms completed, problems noted: No  2. Pregnancy issues include the following which were addressed today:  . Patient's family currently unaware of the pregnancy, counseled that she needs to speak with them soon.   Follow up 4 weeks for next prenatal visit.  At next visit:b - Review labs with patient - Pap smear and  Gc/Ch (patient has never had a pap smear before, unsure if patient is aware of the procedure and purpose) - Update pregnancy plan box, patient had no answers at this time.

## 2020-05-09 ENCOUNTER — Other Ambulatory Visit: Payer: Self-pay | Admitting: Family Medicine

## 2020-05-09 DIAGNOSIS — Z3490 Encounter for supervision of normal pregnancy, unspecified, unspecified trimester: Secondary | ICD-10-CM

## 2020-05-10 LAB — URINE CULTURE, OB REFLEX

## 2020-05-10 LAB — CULTURE, OB URINE

## 2020-05-12 LAB — HGB FRACTIONATION CASCADE
Hgb A2: 2.9 % (ref 1.8–3.2)
Hgb A: 97.1 % (ref 96.4–98.8)
Hgb F: 0 % (ref 0.0–2.0)
Hgb S: 0 %

## 2020-05-12 LAB — RUBELLA ANTIBODY, IGM: Rubella IgM: <20 AU/mL (ref 0.0–19.9)

## 2020-05-12 LAB — RH TYPE: Rh Factor: POSITIVE

## 2020-05-12 LAB — FERRITIN: Ferritin: 11 ng/mL — ABNORMAL LOW (ref 15–150)

## 2020-05-12 LAB — ABO

## 2020-05-31 NOTE — L&D Delivery Note (Signed)
Delivery Note After laboring down for about 2 hours, pt still didn't really feel any pressure. However, baby noted to be at +3 station. After a 30 minute 2nd stage, at 11:16 PM a viable female was delivered via Vaginal, Spontaneous (Presentation: Left Occiput Anterior).  APGAR: 8/9, ; weight pending.  After 1 minute, the cord was clamped and cut. 40 units of pitocin diluted in 1000cc LR was infused rapidly IV.  The placenta separated spontaneously and delivered via CCT and maternal pushing effort.  It was inspected and appears to be intact with a 3 VC.    Placenta status: Spontaneous, Intact. 3V Cord: There was some uterine atony which resulted in some extra bleeding, so cytotec was given PR and TXA 1gm hung IV.  Pt's temp immediately after birth was 100.5, so APAP 1gm given PO.  Delivery assisted by Jon Billings, medical student.     Anesthesia: Epidural Episiotomy: None Lacerations:  2nd degree Suture Repair: 2.0 vicryl Est. Blood Loss (mL): 583  Mom to postpartum.  Baby to Couplet care / Skin to Skin.  Jacqueline Miranda 09/29/2020, 11:50 PM

## 2020-06-03 ENCOUNTER — Ambulatory Visit: Payer: Medicaid Other

## 2020-06-05 ENCOUNTER — Telehealth: Payer: Self-pay | Admitting: Family Medicine

## 2020-06-05 NOTE — Progress Notes (Deleted)
  [facility] Prenatal Visit  Jacqueline Miranda is a 20 y.o. G1P0 at [redacted]w[redacted]d here for routine follow up. She is dated by {Ob dating:14516}.  She reports {symptoms; pregnancy related:14538}.  She reports good fetal movement. No bleeding, loss of fluid, contractions. See flow sheet for details. There were no vitals filed for this visit.   A/P: Pregnancy at [redacted]w[redacted]d.  Doing well.   . Dating reviewed, dating tab is {correct:23336::"correct"} . Fetal heart tones {appropriate:23337} . Fundal height {fundal height:23342::"within expected range. "} . Anatomy ultrasound reviewed and notable for ***.  . Influenza vaccine {given:23340}.  Marland Kitchen COVID vaccination was discussed and ***.  . Indications for screening for preexisting diabetes include: {Pre-existing diabetes screening:23343::"Reviewed indications for early 1 hour glucose testing, not indicated "}.  . Pregnancy education provided on the following topics: fetal growth and movement, ultrasound assessment, and upcoming laboratory assessment.   . Scheduled for [facility] during second trimester on ***. Marland Kitchen Preterm labor precautions given.   2. Pregnancy issues include the following and were addressed as appropriate today:  . *** . Problem list and pregnancy box updated: {yes/no:20286::"Yes"}.     Follow up 4 weeks.

## 2020-06-05 NOTE — Telephone Encounter (Signed)
With Arabic speaking interpreter ID number [phone] and left HIPAA compliant voicemail stating that she missed her appointment at [facility] today and to call and reschedule, left our clinic number.  Sent secure message to patient's case manager Doha with her upcoming ultrasound scheduled on 1/17 with MFM as well as a new appointment scheduled for next week on 06/09/2020 with Dr. Leary Roca.  Have scheduled her in faculty clinic at the end of the month as she has not yet had faculty visit.  At next appointment: -Needs rubella IgG, the order is rubella screen (109323), also needs type and screen as she has not had an antibody screen yet this pregnancy -Please tell patient about her follow-up anatomy ultrasound scheduled with MFM OB on 06/16/2020 -Patient needs Pap smear and gonorrhea and Chlamydia and wet prep testing, as this has not been done yet this pregnancy -Patient needs to start oral iron as her hemoglobin was 10.6 during the second trimester with a ferritin of 11. -Need to follow-up with patient to see if she received her second Covid vaccination as only one is listed in the chart  Burley Saver MD

## 2020-06-09 ENCOUNTER — Other Ambulatory Visit (HOSPITAL_COMMUNITY)
Admission: RE | Admit: 2020-06-09 | Discharge: 2020-06-09 | Disposition: A | Discharge status: Home / Self Care | Payer: Medicaid Other | Source: Ambulatory Visit | Attending: Family Medicine | Admitting: Family Medicine

## 2020-06-09 ENCOUNTER — Other Ambulatory Visit: Payer: Self-pay

## 2020-06-09 ENCOUNTER — Ambulatory Visit (INDEPENDENT_AMBULATORY_CARE_PROVIDER_SITE_OTHER): Payer: Medicaid Other | Admitting: Family Medicine

## 2020-06-09 VITALS — BP 102/62 | HR 81 | Wt 137.0 lb

## 2020-06-09 DIAGNOSIS — D500 Iron deficiency anemia secondary to blood loss (chronic): Secondary | ICD-10-CM

## 2020-06-09 DIAGNOSIS — Z3402 Encounter for supervision of normal first pregnancy, second trimester: Secondary | ICD-10-CM

## 2020-06-09 DIAGNOSIS — Z124 Encounter for screening for malignant neoplasm of cervix: Secondary | ICD-10-CM | POA: Diagnosis not present

## 2020-06-09 DIAGNOSIS — Z1159 Encounter for screening for other viral diseases: Secondary | ICD-10-CM

## 2020-06-09 DIAGNOSIS — O99012 Anemia complicating pregnancy, second trimester: Secondary | ICD-10-CM | POA: Diagnosis not present

## 2020-06-09 DIAGNOSIS — Z113 Encounter for screening for infections with a predominantly sexual mode of transmission: Secondary | ICD-10-CM | POA: Diagnosis not present

## 2020-06-09 DIAGNOSIS — Z3490 Encounter for supervision of normal pregnancy, unspecified, unspecified trimester: Secondary | ICD-10-CM

## 2020-06-09 LAB — POCT WET PREP (WET MOUNT)
Clue Cells Wet Prep Whiff POC: POSITIVE
Trichomonas Wet Prep HPF POC: ABSENT

## 2020-06-09 MED ORDER — FERROUS SULFATE 325 (65 FE) MG PO TABS
325.0000 mg | ORAL_TABLET | Freq: Every day | ORAL | 6 refills | Status: DC
Start: 2020-06-09 — End: 2020-06-26

## 2020-06-09 NOTE — Progress Notes (Signed)
  [facility] Prenatal Visit  Jacqueline Miranda is a 20 y.o. G1P0 at [redacted]w[redacted]d here for routine follow up. She is dated by midtrimester ultrasound.  She reports backache.  She reports good fetal movement. No bleeding, loss of fluid, contractions. See flow sheet for details. There were no vitals filed for this visit.   A/P: Pregnancy at [redacted]w[redacted]d.  Doing well.   . Dating reviewed, dating tab is correct . Fetal heart tones Appropriate . Fundal height within expected range.  . Anatomy ultrasound reviewed and notable for repeat to be done on 06/16/2020 to complete anatomy scan.  . Influenza vaccine previously administered. .  . COVID vaccination was discussed and patient has had her 2nd dose, but forgot to bring card, reminded her to bring.  . Indications for screening for preexisting diabetes include: Reviewed indications for early 1 hour glucose testing, not indicated .  Marland Kitchen Pregnancy education provided on the following topics: fetal growth and movement, ultrasound assessment, and upcoming laboratory assessment.   . Scheduled for [facility] during second trimester on 06/26/20. . Preterm labor precautions given.   2. Pregnancy issues include the following and were addressed as appropriate today:  . Patient needs extensive counseling on what to expect during delivery and afterward . Problem list and pregnancy box updated: Yes.         Ordered PO iron daily, counseled on anemia (hgb 10.6, ferritin 11), vitamin c         Counseled on birth control, undecided        Counseled on breast and bottle feeding        Counseled on health maintenance exams such as pap smears and purpose of pre natal screening: patient declined pap smear today        Will obtain ab screen, wet prep, GC/CT, and rubella ab IgG        Reminded patient of f/u anatomy US with MFM on 06/16/20        Patient has difficulty with transportation, will refer to CCM for assistance    Follow up 4 weeks.  Shirlean Mylar, MD [facility], PGY-2

## 2020-06-09 NOTE — Patient Instructions (Addendum)
It was a pleasure to see you today! Congratulations on your pregnancy!  1. Go to the MAU at [facility] if:  You have cramping/contractions that do not go away with drinking water  Your water breaks.  Sometimes it is a big gush of fluid, sometimes it is just a trickle that keeps getting your underwear wet or running down your legs  You have vaginal bleeding.     You do not feel your baby moving like normal.  If you do not, get something to eat and drink and lay down and focus on feeling your baby move. If your baby is still not moving like normal, you should go to MAU.  2. Please follow up for your anatomy ultrasound on June 16, 2020. My nurse will give you the address.  3. Please return on January 27 to this clinic for your next appointment at 11 AM.  4. Expect a call from chronic care management to help set you up with transportation  5. Please take iron pills daily. I also recommend you take this with orange juice in a dark container as vitamin c helps absorb iron better. If you have problems with nausea or constipation, let us know.    Be Well,  Dr. Leary Roca

## 2020-06-10 LAB — CERVICOVAGINAL ANCILLARY ONLY
Chlamydia: NEGATIVE
Comment: NEGATIVE
Comment: NORMAL
Neisseria Gonorrhea: NEGATIVE

## 2020-06-10 LAB — RUBELLA SCREEN: Rubella Antibodies, IGG: 8.19 index (ref >0.99)

## 2020-06-11 ENCOUNTER — Telehealth: Payer: Self-pay | Admitting: Family Medicine

## 2020-06-11 DIAGNOSIS — B373 Candidiasis of vulva and vagina: Secondary | ICD-10-CM

## 2020-06-11 DIAGNOSIS — B3731 Acute candidiasis of vulva and vagina: Secondary | ICD-10-CM

## 2020-06-11 DIAGNOSIS — O23599 Infection of other part of genital tract in pregnancy, unspecified trimester: Secondary | ICD-10-CM

## 2020-06-11 DIAGNOSIS — B9689 Other specified bacterial agents as the cause of diseases classified elsewhere: Secondary | ICD-10-CM

## 2020-06-11 MED ORDER — TERCONAZOLE 0.4 % VA CREA: 1.0000 | TOPICAL_CREAM | Freq: Every day | VAGINAL | 0 refills | Status: DC

## 2020-06-11 MED ORDER — METRONIDAZOLE 500 MG PO TABS: 500.0000 mg | ORAL_TABLET | Freq: Two times a day (BID) | ORAL | 0 refills | Status: DC

## 2020-06-11 NOTE — Telephone Encounter (Signed)
Called with Arabic interpreter and confirmed I was speaking with Weldon Inches by name and DOB. Discussed results of recent tests that show yeast infection and BV. Will treat vaginal candidiasis with terconazole intravaginal cream x7d. Will treat BV with flagyl BID x7 days. Counseled on not drinking EtOH while using flagyl. Patient expresses understanding.  Shirlean Mylar, MD [facility], PGY-2

## 2020-06-16 ENCOUNTER — Ambulatory Visit: Payer: Medicaid Other

## 2020-06-26 ENCOUNTER — Ambulatory Visit (INDEPENDENT_AMBULATORY_CARE_PROVIDER_SITE_OTHER): Payer: Medicaid Other | Admitting: Family Medicine

## 2020-06-26 ENCOUNTER — Other Ambulatory Visit (HOSPITAL_COMMUNITY)
Admission: RE | Admit: 2020-06-26 | Discharge: 2020-06-26 | Disposition: A | Discharge status: Home / Self Care | Payer: Medicaid Other | Source: Ambulatory Visit | Attending: Family Medicine | Admitting: Family Medicine

## 2020-06-26 ENCOUNTER — Other Ambulatory Visit: Payer: Self-pay

## 2020-06-26 ENCOUNTER — Telehealth: Payer: Self-pay | Admitting: Family Medicine

## 2020-06-26 VITALS — BP 105/70 | HR 75 | Wt 141.4 lb

## 2020-06-26 DIAGNOSIS — Z3402 Encounter for supervision of normal first pregnancy, second trimester: Secondary | ICD-10-CM | POA: Diagnosis not present

## 2020-06-26 DIAGNOSIS — H538 Other visual disturbances: Secondary | ICD-10-CM

## 2020-06-26 DIAGNOSIS — O99019 Anemia complicating pregnancy, unspecified trimester: Secondary | ICD-10-CM

## 2020-06-26 DIAGNOSIS — D500 Iron deficiency anemia secondary to blood loss (chronic): Secondary | ICD-10-CM

## 2020-06-26 MED ORDER — FERROUS SULFATE 325 (65 FE) MG PO TABS: 325.0000 mg | ORAL_TABLET | Freq: Every day | ORAL | 6 refills | Status: DC

## 2020-06-26 NOTE — Progress Notes (Signed)
  [facility] Prenatal Visit  Jacqueline Miranda is a 20 y.o. G1P0 at [redacted]w[redacted]d here for routine follow up. She is dated by LMP, midtrimester ultrasound.  She reports backache. She reports fetal movement. Denies vaginal bleeding, loss of fluid, or contractions.  See flow sheet for details.  Bloody nose when heat is on. Regularly.   A/P: Pregnancy at [redacted]w[redacted]d.  Doing well.   . Dating reviewed, dating tab is correct . Fetal heart tones Appropriate156 . Fundal height within expected range.  . Influenza vaccine previously administered.   Marland Kitchen COVID vaccination has already been given. Second dose received 04/07/2020 . Screening for gestational diabetes Not completed will be completed at next visit. .  . Pregnancy education completed including: fetal growth, breastfeeding, contraception, and expected weight gain in pregnancy.   . The patient does not have a history of Cesarean delivery and no referral to Center for [facility] is indicated . Preterm labor, bleeding, and pain precautions given.    2. Pregnancy issues include the following and were addressed as appropriate today:  . Problem list and pregnancy box updated: Yes   Reordered iron for patient because she reported that it was not received at the pharmacy.  She was diagnosed with anemia at previous visit with a hemoglobin of 10.6 and a ferritin of 11.  Also encouraged her to continue taking her prenatal vitamins.  Patient is still undecided on birth control but does not think she is going to get anything at this time because her husband does not hear  Patient has MFM appointment scheduled for tomorrow 1/28  Patient is seeing CCM.  Her family now knows about the pregnancy and is supportive  Pap smear collected today  Antibody screen collected today  Patient will follow up in 2 weeks for 28-week labs, 1 hour GTT  Follow up 4 weeks.

## 2020-06-26 NOTE — Telephone Encounter (Signed)
   Telephone encounter was:  Successful.  06/26/2020 Name: Jacqueline Miranda MRN: [id number] DOB: Dec 10, 2000  [name] is a 20 y.o. year old female who is a primary care patient of Lilland, Alana, DO . The community resource team was consulted for assistance with Transportation Needs   Care guide performed the following interventions: Patient provided with information about care guide support team and interviewed to confirm resource needs Discussed resources to assist with transportation for ultrasound tomorrow at the Center for [facility].  Obtained verbal consent to place patient referral to [facility] transportation Placed referral to [facility] Transportation via email referral..  Follow Up Plan:  Care guide will follow up with patient by phone over the next day with email information on how to use Medicaid transportation for future travel needs. , Care guide will outreach resources to assist patient with [facility] Transportation to make sure she has transportation for tomorrow's ultrasound.  and Client will answer her phone to make sure to get the resource help she needs.   Rojelio Brenner Care Guide, Embedded Care Coordination [facility], Care Management Phone: [phone] Email: [email or web address]

## 2020-06-26 NOTE — Patient Instructions (Signed)
It was nice to meet you today!  Go to the MAU at [facility] at [facility] if:  You have cramping/contractions that do not go away with drinking water  Your water breaks.  Sometimes it is a big gush of fluid, sometimes it is just a trickle that keeps getting your underwear wet or running down your legs  You have vaginal bleeding.     You do not feel your baby moving like normal.  If you do not, get something to eat and drink and lay down and focus on feeling your baby move. If your baby is still not moving like normal, you should go to MAU.   Next visit will do diabetes test and get bloodwork  Antibody screen today Pap smear today Referring to eye doctor Start iron pills  Follow up in 2 weeks  Be well, Dr. Pollie Meyer

## 2020-06-27 ENCOUNTER — Encounter: Payer: Self-pay | Admitting: *Deleted

## 2020-06-27 ENCOUNTER — Ambulatory Visit: Payer: Medicaid Other | Attending: Obstetrics and Gynecology | Admitting: *Deleted

## 2020-06-27 ENCOUNTER — Ambulatory Visit (HOSPITAL_BASED_OUTPATIENT_CLINIC_OR_DEPARTMENT_OTHER): Payer: Medicaid Other

## 2020-06-27 VITALS — BP 103/63 | HR 72

## 2020-06-27 DIAGNOSIS — O0932 Supervision of pregnancy with insufficient antenatal care, second trimester: Secondary | ICD-10-CM | POA: Diagnosis not present

## 2020-06-27 DIAGNOSIS — Z3492 Encounter for supervision of normal pregnancy, unspecified, second trimester: Secondary | ICD-10-CM

## 2020-06-27 DIAGNOSIS — Z3A26 26 weeks gestation of pregnancy: Secondary | ICD-10-CM | POA: Insufficient documentation

## 2020-06-27 DIAGNOSIS — O0930 Supervision of pregnancy with insufficient antenatal care, unspecified trimester: Secondary | ICD-10-CM

## 2020-06-27 DIAGNOSIS — Z363 Encounter for antenatal screening for malformations: Secondary | ICD-10-CM | POA: Insufficient documentation

## 2020-06-27 LAB — ANTIBODY SCREEN: Antibody Screen: NEGATIVE

## 2020-06-28 DIAGNOSIS — O99019 Anemia complicating pregnancy, unspecified trimester: Secondary | ICD-10-CM

## 2020-06-28 HISTORY — DX: Anemia complicating pregnancy, unspecified trimester: O99.019

## 2020-07-01 LAB — CYTOLOGY - PAP: Diagnosis: NEGATIVE

## 2020-07-02 ENCOUNTER — Encounter: Payer: Self-pay | Admitting: Family Medicine

## 2020-07-06 NOTE — Progress Notes (Signed)
  [facility] Prenatal Visit  The encounter was translated by [facility] #[id number]  Jacqueline Miranda is a 20 y.o. G1P0 at [redacted]w[redacted]d here for routine follow up. She is dated by 18-week ultrasound.  She reports no complaints. She reports fetal movement. She denies vaginal bleeding, contractions, or loss of fluid. See flow sheet for details.  Vitals:   07/08/20 1133  BP: 98/62  Pulse: 66     A/P: Pregnancy at [redacted]w[redacted]d.  Doing well.   1. Routine prenatal care:  Marland Kitchen Dating reviewed, dating tab is correct . Fetal heart tones Appropriate - 148 . Fundal height within expected range.  - 28cm . Infant feeding choice: Breastfeeding . Contraception choice: Undecided  . Infant circumcision desired not applicable  . The patient does not have a history of Cesarean delivery and no referral to Center for [facility] is indicated (first baby) . Influenza vaccine previously administered.  October 2021 . Tdap was given today 07/08/2020 . CBC, RPR, and HIV were obtained today.  1 hour glucola test was not collected as patient arrived late. Patient instructed to arrive early to obtain test at next visit  . Rh status was reviewed and patient does not need Rhogam.  Rhogam was not given today - patient's blood type B+ . Pregnancy medical home forms were not done today. PHQ-9 score was 8, negative question 9. . Childbirth and education classes were not offered. . Pregnancy education regarding benefits of breastfeeding, contraception, fetal growth, expected weight gain, and safe infant sleep were discussed.  . Preterm labor and fetal movement precautions reviewed.  2. Pregnancy issues include the following and were addressed as appropriate today: . Positive RPR: patient's RPR on 07/08/2020 was positive. Her test from November 2021 was negative and therefore could be a false positive RPR. Will follow the T.pallidum antibodies and treat/educate as needed. . Problem list and pregnancy box updated: Yes.    Follow  up 2 weeks with PCP.  Peggyann Shoals, DO [facility], PGY-3 07/09/2020 8:57 PM

## 2020-07-08 ENCOUNTER — Ambulatory Visit: Payer: Medicaid Other | Admitting: Family Medicine

## 2020-07-08 ENCOUNTER — Other Ambulatory Visit: Payer: Self-pay

## 2020-07-08 VITALS — BP 98/62 | HR 66 | Wt 140.0 lb

## 2020-07-08 DIAGNOSIS — Z3402 Encounter for supervision of normal first pregnancy, second trimester: Secondary | ICD-10-CM | POA: Diagnosis not present

## 2020-07-08 DIAGNOSIS — Z23 Encounter for immunization: Secondary | ICD-10-CM | POA: Diagnosis not present

## 2020-07-08 DIAGNOSIS — A530 Latent syphilis, unspecified as early or late: Secondary | ICD-10-CM

## 2020-07-08 NOTE — Patient Instructions (Addendum)
We will do Glucola test at your next appointment - this appointment is at least 1 hour!  Today we are testing for anemia, syphilis and HIV.  We will see you in 2 weeks.   Peggyann Shoals, DO [facility], PGY-3 07/08/2020 12:05 PM

## 2020-07-09 DIAGNOSIS — A530 Latent syphilis, unspecified as early or late: Secondary | ICD-10-CM | POA: Insufficient documentation

## 2020-07-10 LAB — CBC
Hematocrit: 33.1 % — ABNORMAL LOW (ref 34.0–46.6)
Hemoglobin: 11.2 g/dL (ref 11.1–15.9)
MCH: 29.2 pg (ref 26.6–33.0)
MCHC: 33.8 g/dL (ref 31.5–35.7)
MCV: 86 fL (ref 79–97)
Platelets: 230 10*3/uL (ref 150–450)
RBC: 3.84 x10E6/uL (ref 3.77–5.28)
RDW: 15.7 % — ABNORMAL HIGH (ref 11.7–15.4)
WBC: 12.2 10*3/uL — ABNORMAL HIGH (ref 3.4–10.8)

## 2020-07-10 LAB — RPR, QUANT+TP ABS (REFLEX)
Rapid Plasma Reagin, Quant: 1:1 {titer} — ABNORMAL HIGH
T Pallidum Abs: NONREACTIVE

## 2020-07-10 LAB — HIV ANTIBODY (ROUTINE TESTING W REFLEX): HIV Screen 4th Generation wRfx: NONREACTIVE

## 2020-07-10 LAB — RPR: RPR Ser Ql: REACTIVE — AB

## 2020-07-24 ENCOUNTER — Ambulatory Visit: Payer: Medicaid Other | Admitting: Family Medicine

## 2020-07-24 ENCOUNTER — Encounter: Payer: Self-pay | Admitting: Family Medicine

## 2020-07-24 ENCOUNTER — Other Ambulatory Visit: Payer: Self-pay

## 2020-07-24 ENCOUNTER — Other Ambulatory Visit: Payer: Self-pay | Admitting: Family Medicine

## 2020-07-24 VITALS — BP 100/60 | HR 85 | Wt 144.4 lb

## 2020-07-24 DIAGNOSIS — Z3400 Encounter for supervision of normal first pregnancy, unspecified trimester: Secondary | ICD-10-CM

## 2020-07-24 DIAGNOSIS — Z3A30 30 weeks gestation of pregnancy: Secondary | ICD-10-CM

## 2020-07-24 LAB — POCT 1 HR PRENATAL GLUCOSE: Glucose 1 Hr Prenatal, POC: 139 mg/dL

## 2020-07-24 MED ORDER — PRENATAL COMPLETE 14-0.4 MG PO TABS: 1.0000 | ORAL_TABLET | Freq: Every day | ORAL | 5 refills | Status: DC

## 2020-07-24 NOTE — Patient Instructions (Addendum)
It was so great seeing you today! You are measuring very well and gaining an appropriate amount of weight. We will schedule you for your next visit in 2 weeks.     Third Trimester of Pregnancy  The third trimester of pregnancy is from week 28 through week 40. This is also called months 7 through 9. This trimester is when your unborn baby (fetus) is growing very fast. At the end of the ninth month, the unborn baby is about 20 inches long. It weighs about 6-10 pounds. Body changes during your third trimester Your body continues to go through many changes during this time. The changes vary and generally return to normal after the baby is born. Physical changes  Your weight will continue to increase. You may gain 25-35 pounds (11-16 kg) by the end of the pregnancy. If you are underweight, you may gain 28-40 lb (about 13-18 kg). If you are overweight, you may gain 15-25 lb (about 7-11 kg).  You may start to get stretch marks on your hips, belly (abdomen), and breasts.  Your breasts will continue to grow and may hurt. A yellow fluid (colostrum) may leak from your breasts. This is the first milk you are making for your baby.  You may have changes in your hair.  Your belly button may stick out.  You may have more swelling in your hands, face, or ankles. Health changes  You may have heartburn.  You may have trouble pooping (constipation).  You may get hemorrhoids. These are swollen veins in the butt that can itch or get painful.  You may have swollen veins (varicose veins) in your legs.  You may have more body aches in the pelvis, back, or thighs.  You may have more tingling or numbness in your hands, arms, and legs. The skin on your belly may also feel numb.  You may feel short of breath as your womb (uterus) gets bigger. Other changes  You may pee (urinate) more often.  You may have more problems sleeping.  You may notice the unborn baby "dropping," or moving lower in your  belly.  You may have more discharge coming from your vagina.  Your joints may feel loose, and you may have pain around your pelvic bone. Follow these instructions at home: Medicines  Take over-the-counter and prescription medicines only as told by your doctor. Some medicines are not safe during pregnancy.  Take a prenatal vitamin that contains at least 600 micrograms (mcg) of folic acid. Eating and drinking  Eat healthy meals that include: ? Fresh fruits and vegetables. ? Whole grains. ? Good sources of protein, such as meat, eggs, or tofu. ? Low-fat dairy products.  Avoid raw meat and unpasteurized juice, milk, and cheese. These carry germs that can harm you and your baby.  Eat 4 or 5 small meals rather than 3 large meals a day.  You may need to take these actions to prevent or treat trouble pooping: ? Drink enough fluids to keep your pee (urine) pale yellow. ? Eat foods that are high in fiber. These include beans, whole grains, and fresh fruits and vegetables. ? Limit foods that are high in fat and sugar. These include fried or sweet foods. Activity  Exercise only as told by your doctor. Stop exercising if you start to have cramps in your womb.  Avoid heavy lifting.  Do not exercise if it is too hot or too humid, or if you are in a place of great height (high altitude).  If you choose to, you may have sex unless your doctor tells you not to. Relieving pain and discomfort  Take breaks often, and rest with your legs raised (elevated) if you have leg cramps or low back pain.  Take warm water baths (sitz baths) to soothe pain or discomfort caused by hemorrhoids. Use hemorrhoid cream if your doctor approves.  Wear a good support bra if your breasts are tender.  If you develop bulging, swollen veins in your legs: ? Wear support hose as told by your doctor. ? Raise your feet for 15 minutes, 3-4 times a day. ? Limit salt in your food. Safety  Talk to your doctor before  traveling far distances.  Do not use hot tubs, steam rooms, or saunas.  Wear your seat belt at all times when you are in a car.  Talk with your doctor if someone is hurting you or yelling at you a lot. Preparing for your baby's arrival To prepare for the arrival of your baby:  Take prenatal classes.  Visit the hospital and tour the maternity area.  Buy a rear-facing car seat. Learn how to install it in your car.  Prepare the baby's room. Take out all pillows and stuffed animals from the baby's crib. General instructions  Avoid cat litter boxes and soil used by cats. These carry germs that can cause harm to the baby and can cause a loss of your baby by miscarriage or stillbirth.  Do not douche or use tampons. Do not use scented sanitary pads.  Do not smoke or use any products that contain nicotine or tobacco. If you need help quitting, ask your doctor.  Do not drink alcohol.  Do not use herbal medicines, illegal drugs, or medicines that were not approved by your doctor. Chemicals in these products can affect your baby.  Keep all follow-up visits. This is important. Where to find more information  American Pregnancy Association: americanpregnancy.org  [employer] of Obstetricians and Gynecologists: www.acog.org  Office on Women's Health: MightyReward.co.nz Contact a doctor if:  You have a fever.  You have mild cramps or pressure in your lower belly.  You have a nagging pain in your belly area.  You vomit, or you have watery poop (diarrhea).  You have bad-smelling fluid coming from your vagina.  You have pain when you pee, or your pee smells bad.  You have a headache that does not go away when you take medicine.  You have changes in how you see, or you see spots in front of your eyes. Get help right away if:  Your water breaks.  You have regular contractions that are less than 5 minutes apart.  You are spotting or bleeding from your vagina.  You  have very bad belly cramps or pain.  You have trouble breathing.  You have chest pain.  You faint.  You have not felt the baby move for the amount of time told by your doctor.  You have new or increased pain, swelling, or redness in an arm or leg. Summary  The third trimester is from week 28 through week 40 (months 7 through 9). This is the time when your unborn baby is growing very fast.  During this time, your discomfort may increase as you gain weight and as your baby grows.  Get ready for your baby to arrive by taking prenatal classes, buying a rear-facing car seat, and preparing the baby's room.  Get help right away if you are bleeding from your vagina,  you have chest pain and trouble breathing, or you have not felt the baby move for the amount of time told by your doctor. This information is not intended to replace advice given to you by your health care provider. Make sure you discuss any questions you have with your health care provider. Document Revised: 10/24/2019 Document Reviewed: 08/30/2019 Elsevier Patient Education  2021 [employer].

## 2020-07-24 NOTE — Progress Notes (Signed)
  [facility] Prenatal Visit  Jacqueline Miranda is a 20 y.o. G1P0 at [redacted]w[redacted]d here for routine follow up. She is dated by 18 week [place].  She reports no complaints. She reports fetal movement. She denies vaginal bleeding, contractions, or loss of fluid. See flow sheet for details.  Vitals:   07/24/20 1048  BP: 100/60  Pulse: 85      A/P: Pregnancy at [redacted]w[redacted]d.  Doing well.   1. Routine prenatal care:  Marland Kitchen Dating reviewed, dating tab is correct . Fetal heart tones Appropriate 157 . Fundal height within expected range.  - 28cm . Infant feeding choice: Breastfeeding . Contraception choice: Undecided  . Infant circumcision desired not applicable  . The patient does not have a history of Cesarean delivery and no referral to Center for [facility] is indicated . Influenza vaccine previously administered.   . Tdap was given 07/08/2020. . 1 hour glucola, CBC, RPR, and HIV were obtained today.    . Rh status was reviewed and patient does not need Rhogam.  Rhogam was not given today.  . Pregnancy medical home and PHQ-9 forms were not done today and reviewed.   . Childbirth and education classes were not offered. . Pregnancy education regarding benefits of breastfeeding, contraception, fetal growth, expected weight gain, and safe infant sleep were discussed.  . Preterm labor and fetal movement precautions reviewed.  2. Pregnancy issues include the following and were addressed as appropriate today:  . False positive RPR: RPR on 07/08/2020 was positive with non-reactive T. Pallidum abs. . Elevated 1-hr Glucola to 139. 3hr Glucola to be completed at next visit on 08/05/2020 . Problem list and pregnancy box updated: Yes.   Patient scheduled in [facility] during third trimester on 09/08/2020.  Follow up 2 weeks with Dr. Peggyann Shoals on 08/05/2020.

## 2020-08-05 ENCOUNTER — Other Ambulatory Visit: Payer: Self-pay

## 2020-08-05 ENCOUNTER — Ambulatory Visit: Payer: Medicaid Other | Admitting: Family Medicine

## 2020-08-05 VITALS — BP 107/72 | HR 86 | Wt 142.6 lb

## 2020-08-05 DIAGNOSIS — Z3400 Encounter for supervision of normal first pregnancy, unspecified trimester: Secondary | ICD-10-CM | POA: Diagnosis not present

## 2020-08-05 DIAGNOSIS — Z3403 Encounter for supervision of normal first pregnancy, third trimester: Secondary | ICD-10-CM

## 2020-08-05 DIAGNOSIS — Z3A30 30 weeks gestation of pregnancy: Secondary | ICD-10-CM | POA: Diagnosis not present

## 2020-08-05 LAB — POCT CBG (FASTING - GLUCOSE)-MANUAL ENTRY: Glucose Fasting, POC: 76 mg/dL (ref 70–99)

## 2020-08-05 NOTE — Patient Instructions (Signed)
Thank you for coming in to see [place] today! Please see below to review our plan for today's visit:  1. Come back in 1 week for a lab visit for 3 hour glucola-tolerance test - fasting!  2. Come back in 2 weeks for next OB appointment.   Please call the clinic at [phone] if your symptoms worsen or you have any concerns. It was our pleasure to serve you!   Dr. Peggyann Shoals [facility]

## 2020-08-05 NOTE — Progress Notes (Signed)
  [facility] Prenatal Visit  Interpreter Vincent Gros [phone]  Jacqueline Miranda is a 20 y.o. G1P0 at [redacted]w[redacted]d here for routine follow up. She is dated by 18-week [place].  She reports no bleeding, no contractions, no cramping and no leaking.  She reports fetal movement. She denies vaginal bleeding, contractions, or loss of fluid.  See flow sheet for details.  Patient presents early today for her 3-hour GTT.  Vitals:   08/05/20 0854  BP: 107/72  Pulse: 86     A/P: Pregnancy at [redacted]w[redacted]d.  Doing well.   1. Routine prenatal care:  Marland Kitchen Dating reviewed, dating tab is correct . Fetal heart tones: Appropriate 156 . Fundal height: within expected range.  . The patient does not have a history of HSV and valacyclovir is not indicated at this time.  . The patient does not have a history of Cesarean delivery and no referral to Center for [facility] is indicated . Infant feeding choice: Breastfeeding . Contraception choice: Undecided  - "none", patient's significant other lives in a different country . Infant circumcision desired not applicable . Influenza vaccine previously administered.   03/06/2020 . Tdap was not given today. 07/08/2020 . COVID vaccination was discussed and was previously given.  . Childbirth and education classes were offered and were declined . Pregnancy education regarding benefits of breastfeeding, contraception, fetal growth, expected weight gain, and safe infant sleep were discussed.  . Preterm labor and fetal movement precautions reviewed.  2. Pregnancy issues include the following and were addressed as appropriate today: . Problem list and pregnancy box updated: Yes.   3-Hour GTT: Patient's 1 hour GTT elevated.  Patient presented early on 08/05/2020 for 3-hour gtt. at the time of her appointment around 10 AM she told me that she threw up not too long after drinking the Glucola.  Therefore, cannot continue Glucola tolerance test today and needs to be restarted, rescheduled for  3/15.  Plan to reorder 3-hour GTT and administer Zofran in the clinic before starting test.  Follow up 2 weeks for doctor's visit  Peggyann Shoals, DO [facility], PGY-3 08/09/2020 10:34 AM

## 2020-08-08 ENCOUNTER — Encounter: Payer: Medicaid Other | Admitting: Family Medicine

## 2020-08-09 MED ORDER — ONDANSETRON 4 MG PO TBDP: 4.0000 mg | ORAL_TABLET | Freq: Once | ORAL | Status: DC

## 2020-08-12 ENCOUNTER — Other Ambulatory Visit: Payer: Self-pay | Admitting: Family Medicine

## 2020-08-12 ENCOUNTER — Other Ambulatory Visit (INDEPENDENT_AMBULATORY_CARE_PROVIDER_SITE_OTHER): Payer: Medicaid Other

## 2020-08-12 ENCOUNTER — Other Ambulatory Visit: Payer: Self-pay

## 2020-08-12 DIAGNOSIS — Z3403 Encounter for supervision of normal first pregnancy, third trimester: Secondary | ICD-10-CM

## 2020-08-12 LAB — POCT CBG (FASTING - GLUCOSE)-MANUAL ENTRY: Glucose Fasting, POC: 84 mg/dL (ref 70–99)

## 2020-08-13 LAB — GESTATIONAL GLUCOSE TOLERANCE
Glucose, Fasting: 71 mg/dL (ref 65–94)
Glucose, GTT - 1 Hour: 170 mg/dL (ref 65–179)
Glucose, GTT - 2 Hour: 116 mg/dL (ref 65–154)
Glucose, GTT - 3 Hour: 63 mg/dL — ABNORMAL LOW (ref 65–139)

## 2020-08-25 NOTE — Progress Notes (Signed)
  [facility] Prenatal Visit  Jacqueline Miranda is a 20 y.o. G1P0 at [redacted]w[redacted]d for routine follow up.  She reports feeling well, occasionally has some fatigue/shortness of breath. She reports fetal movement. She denies vaginal bleeding, contractions, or loss of fluid.   See flow sheet for details.  Vitals:   08/26/20 1002  BP: 100/68  Pulse: 71     A/P: Pregnancy at [redacted]w[redacted]d.  Doing well.   1. Routine prenatal care:  Marland Kitchen Infant feeding choice  . Contraception choice - none as partner is out of country  . Infant circumcision desired - not applicable . Tdap was given 07/08/2020. Marland Kitchen COVID vaccination was discussed and was vaccinated 03/06/2020 and 04/07/2020.  Marland Kitchen GBS/GC/CZ testing was not performed today. . Preterm labor precautions reviewed. . Safe sleep discussed. . Kick counts reviewed. . Preterm labor precautions reviewed. . Safe sleep discussed. . Kick counts reviewed.  2. Pregnancy issues include the following and were addressed as appropriate today: . Problem list and pregnancy box updated: Yes.   Follow up in 1 weeks.    Peggyann Shoals, DO [facility], PGY-3 08/26/2020 10:51 AM

## 2020-08-26 ENCOUNTER — Other Ambulatory Visit: Payer: Self-pay

## 2020-08-26 ENCOUNTER — Ambulatory Visit: Payer: Medicaid Other | Admitting: Family Medicine

## 2020-08-26 VITALS — BP 100/68 | HR 71 | Wt 145.0 lb

## 2020-08-26 DIAGNOSIS — Z3403 Encounter for supervision of normal first pregnancy, third trimester: Secondary | ICD-10-CM

## 2020-08-26 DIAGNOSIS — D500 Iron deficiency anemia secondary to blood loss (chronic): Secondary | ICD-10-CM

## 2020-08-26 MED ORDER — FERROUS SULFATE 325 (65 FE) MG PO TABS: 325.0000 mg | ORAL_TABLET | Freq: Every day | ORAL | 6 refills | Status: DC

## 2020-09-04 ENCOUNTER — Other Ambulatory Visit: Payer: Self-pay

## 2020-09-04 ENCOUNTER — Ambulatory Visit (INDEPENDENT_AMBULATORY_CARE_PROVIDER_SITE_OTHER): Payer: Medicaid Other | Admitting: Family Medicine

## 2020-09-04 ENCOUNTER — Other Ambulatory Visit (HOSPITAL_COMMUNITY)
Admission: RE | Admit: 2020-09-04 | Discharge: 2020-09-04 | Disposition: A | Discharge status: Home / Self Care | Payer: Medicaid Other | Source: Ambulatory Visit | Attending: Family Medicine | Admitting: Family Medicine

## 2020-09-04 ENCOUNTER — Telehealth: Payer: Self-pay | Admitting: *Deleted

## 2020-09-04 VITALS — BP 96/60 | HR 78 | Wt 142.6 lb

## 2020-09-04 DIAGNOSIS — F430 Acute stress reaction: Secondary | ICD-10-CM | POA: Insufficient documentation

## 2020-09-04 DIAGNOSIS — Z3A36 36 weeks gestation of pregnancy: Secondary | ICD-10-CM

## 2020-09-04 DIAGNOSIS — O26893 Other specified pregnancy related conditions, third trimester: Secondary | ICD-10-CM | POA: Insufficient documentation

## 2020-09-04 NOTE — Telephone Encounter (Signed)
[facility] Indigent Fund Voucher:  Previous assistance?  No  What was provided? Taxi Voucher  Provider:  Melba Coon  Clinic Staff:  Jone Baseman, CMA  Note routed to [facility], CMA and Sammuel Hines, LCSW to add to [employer].

## 2020-09-04 NOTE — Patient Instructions (Addendum)
It was wonderful to see you today.  Please bring ALL of your medications with you to every visit.   Today we did a swab to check for a Group B Strep which is a bacteria. We will let you know the results at your next appointment.  Please go to the Maternal Assessment Unit if you feel decreased movement, have vaginal bleeding, leakage of fluid or contractions.  Your next appointment will be with me on 4/14 at 9:15 AM.   Thank you for choosing [facility].   Please call [phone] with any questions about today's appointment.  Please be sure to schedule follow up at the front  desk before you leave today.   Sabino Dick, DO PGY-1 Family Medicine   Third Trimester of Pregnancy  The third trimester of pregnancy is from week 28 through week 40. This is also called months 7 through 9. This trimester is when your unborn baby (fetus) is growing very fast. At the end of the ninth month, the unborn baby is about 20 inches long. It weighs about 6-10 pounds. Body changes during your third trimester Your body continues to go through many changes during this time. The changes vary and generally return to normal after the baby is born. Physical changes  Your weight will continue to increase. You may gain 25-35 pounds (11-16 kg) by the end of the pregnancy. If you are underweight, you may gain 28-40 lb (about 13-18 kg). If you are overweight, you may gain 15-25 lb (about 7-11 kg).  You may start to get stretch marks on your hips, belly (abdomen), and breasts.  Your breasts will continue to grow and may hurt. A yellow fluid (colostrum) may leak from your breasts. This is the first milk you are making for your baby.  You may have changes in your hair.  Your belly button may stick out.  You may have more swelling in your hands, face, or ankles. Health changes  You may have heartburn.  You may have trouble pooping (constipation).  You may get hemorrhoids. These are  swollen veins in the butt that can itch or get painful.  You may have swollen veins (varicose veins) in your legs.  You may have more body aches in the pelvis, back, or thighs.  You may have more tingling or numbness in your hands, arms, and legs. The skin on your belly may also feel numb.  You may feel short of breath as your womb (uterus) gets bigger. Other changes  You may pee (urinate) more often.  You may have more problems sleeping.  You may notice the unborn baby "dropping," or moving lower in your belly.  You may have more discharge coming from your vagina.  Your joints may feel loose, and you may have pain around your pelvic bone. Follow these instructions at home: Medicines  Take over-the-counter and prescription medicines only as told by your doctor. Some medicines are not safe during pregnancy.  Take a prenatal vitamin that contains at least 600 micrograms (mcg) of folic acid. Eating and drinking  Eat healthy meals that include: ? Fresh fruits and vegetables. ? Whole grains. ? Good sources of protein, such as meat, eggs, or tofu. ? Low-fat dairy products.  Avoid raw meat and unpasteurized juice, milk, and cheese. These carry germs that can harm you and your baby.  Eat 4 or 5 small meals rather than 3 large meals a day.  You may need to take these actions to prevent or  treat trouble pooping: ? Drink enough fluids to keep your pee (urine) pale yellow. ? Eat foods that are high in fiber. These include beans, whole grains, and fresh fruits and vegetables. ? Limit foods that are high in fat and sugar. These include fried or sweet foods. Activity  Exercise only as told by your doctor. Stop exercising if you start to have cramps in your womb.  Avoid heavy lifting.  Do not exercise if it is too hot or too humid, or if you are in a place of great height (high altitude).  If you choose to, you may have sex unless your doctor tells you not to. Relieving pain and  discomfort  Take breaks often, and rest with your legs raised (elevated) if you have leg cramps or low back pain.  Take warm water baths (sitz baths) to soothe pain or discomfort caused by hemorrhoids. Use hemorrhoid cream if your doctor approves.  Wear a good support bra if your breasts are tender.  If you develop bulging, swollen veins in your legs: ? Wear support hose as told by your doctor. ? Raise your feet for 15 minutes, 3-4 times a day. ? Limit salt in your food. Safety  Talk to your doctor before traveling far distances.  Do not use hot tubs, steam rooms, or saunas.  Wear your seat belt at all times when you are in a car.  Talk with your doctor if someone is hurting you or yelling at you a lot. Preparing for your baby's arrival To prepare for the arrival of your baby:  Take prenatal classes.  Visit the hospital and tour the maternity area.  Buy a rear-facing car seat. Learn how to install it in your car.  Prepare the baby's room. Take out all pillows and stuffed animals from the baby's crib. General instructions  Avoid cat litter boxes and soil used by cats. These carry germs that can cause harm to the baby and can cause a loss of your baby by miscarriage or stillbirth.  Do not douche or use tampons. Do not use scented sanitary pads.  Do not smoke or use any products that contain nicotine or tobacco. If you need help quitting, ask your doctor.  Do not drink alcohol.  Do not use herbal medicines, illegal drugs, or medicines that were not approved by your doctor. Chemicals in these products can affect your baby.  Keep all follow-up visits. This is important. Where to find more information  American Pregnancy Association: americanpregnancy.org  [employer] of Obst[employer]ians and Gynecologists: www.acog.org  Office on Women's Health: MightyReward.co.nz Contact a doctor if:  You have a fever.  You have mild cramps or pressure in your lower  belly.  You have a nagging pain in your belly area.  You vomit, or you have watery poop (diarrhea).  You have bad-smelling fluid coming from your vagina.  You have pain when you pee, or your pee smells bad.  You have a headache that does not go away when you take medicine.  You have changes in how you see, or you see spots in front of your eyes. Get help right away if:  Your water breaks.  You have regular contractions that are less than 5 minutes apart.  You are spotting or bleeding from your vagina.  You have very bad belly cramps or pain.  You have trouble breathing.  You have chest pain.  You faint.  You have not felt the baby move for the amount of time told  by your doctor.  You have new or increased pain, swelling, or redness in an arm or leg. Summary  The third trimester is from week 28 through week 40 (months 7 through 9). This is the time when your unborn baby is growing very fast.  During this time, your discomfort may increase as you gain weight and as your baby grows.  Get ready for your baby to arrive by taking prenatal classes, buying a rear-facing car seat, and preparing the baby's room.  Get help right away if you are bleeding from your vagina, you have chest pain and trouble breathing, or you have not felt the baby move for the amount of time told by your doctor. This information is not intended to replace advice given to you by your health care provider. Make sure you discuss any questions you have with your health care provider. Document Revised: 10/24/2019 Document Reviewed: 08/30/2019 Elsevier Patient Education  2021 [employer].
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
## 2020-09-05 LAB — CERVICOVAGINAL ANCILLARY ONLY
Chlamydia: NEGATIVE
Comment: NEGATIVE
Comment: NORMAL
Neisseria Gonorrhea: NEGATIVE

## 2020-09-08 LAB — CULTURE, BETA STREP (GROUP B ONLY): Strep Gp B Culture: NEGATIVE
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
## 2020-09-11 ENCOUNTER — Other Ambulatory Visit: Payer: Self-pay

## 2020-09-11 ENCOUNTER — Ambulatory Visit (INDEPENDENT_AMBULATORY_CARE_PROVIDER_SITE_OTHER): Payer: Medicaid Other

## 2020-09-11 ENCOUNTER — Ambulatory Visit: Payer: Medicaid Other | Admitting: Family Medicine

## 2020-09-11 DIAGNOSIS — J3489 Other specified disorders of nose and nasal sinuses: Secondary | ICD-10-CM | POA: Insufficient documentation

## 2020-09-11 DIAGNOSIS — Z3400 Encounter for supervision of normal first pregnancy, unspecified trimester: Secondary | ICD-10-CM

## 2020-09-11 DIAGNOSIS — Z23 Encounter for immunization: Secondary | ICD-10-CM | POA: Diagnosis not present

## 2020-09-11 MED ORDER — PRENATAL COMPLETE 14-0.4 MG PO TABS: 1.0000 | ORAL_TABLET | Freq: Every day | ORAL | 5 refills | Status: DC

## 2020-09-11 MED ORDER — SALINE 0.9 % NA AERS: 1.0000 | INHALATION_SPRAY | NASAL | 0 refills | Status: DC | PRN

## 2020-09-11 NOTE — Patient Instructions (Addendum)
It was wonderful to see you today.  Please bring ALL of your medications with you to every visit.   Today we talked about:  -Your baby is growing well and her heart sounds good today.  -You received a COVID vaccine booster today.  -You can try a saline nasal mist for your nose. I sent another prescription to your pharmacy for prenatal vitamins.  -Return in 1 week. You have an appointment with me 4/22 at 9:45AM.   Thank you for choosing [facility].   Please call [phone] with any questions about today's appointment.  Please be sure to schedule follow up at the front  desk before you leave today.   Sabino Dick, DO PGY-1 Family Medicine    Third Trimester of Pregnancy  The third trimester of pregnancy is from week 28 through week 40. This is also called months 7 through 9. This trimester is when your unborn baby (fetus) is growing very fast. At the end of the ninth month, the unborn baby is about 20 inches long. It weighs about 6-10 pounds. Body changes during your third trimester Your body continues to go through many changes during this time. The changes vary and generally return to normal after the baby is born. Physical changes  Your weight will continue to increase. You may gain 25-35 pounds (11-16 kg) by the end of the pregnancy. If you are underweight, you may gain 28-40 lb (about 13-18 kg). If you are overweight, you may gain 15-25 lb (about 7-11 kg).  You may start to get stretch marks on your hips, belly (abdomen), and breasts.  Your breasts will continue to grow and may hurt. A yellow fluid (colostrum) may leak from your breasts. This is the first milk you are making for your baby.  You may have changes in your hair.  Your belly button may stick out.  You may have more swelling in your hands, face, or ankles. Health changes  You may have heartburn.  You may have trouble pooping (constipation).  You may get hemorrhoids. These are  swollen veins in the butt that can itch or get painful.  You may have swollen veins (varicose veins) in your legs.  You may have more body aches in the pelvis, back, or thighs.  You may have more tingling or numbness in your hands, arms, and legs. The skin on your belly may also feel numb.  You may feel short of breath as your womb (uterus) gets bigger. Other changes  You may pee (urinate) more often.  You may have more problems sleeping.  You may notice the unborn baby "dropping," or moving lower in your belly.  You may have more discharge coming from your vagina.  Your joints may feel loose, and you may have pain around your pelvic bone. Follow these instructions at home: Medicines  Take over-the-counter and prescription medicines only as told by your doctor. Some medicines are not safe during pregnancy.  Take a prenatal vitamin that contains at least 600 micrograms (mcg) of folic acid. Eating and drinking  Eat healthy meals that include: ? Fresh fruits and vegetables. ? Whole grains. ? Good sources of protein, such as meat, eggs, or tofu. ? Low-fat dairy products.  Avoid raw meat and unpasteurized juice, milk, and cheese. These carry germs that can harm you and your baby.  Eat 4 or 5 small meals rather than 3 large meals a day.  You may need to take these actions to prevent or treat  trouble pooping: ? Drink enough fluids to keep your pee (urine) pale yellow. ? Eat foods that are high in fiber. These include beans, whole grains, and fresh fruits and vegetables. ? Limit foods that are high in fat and sugar. These include fried or sweet foods. Activity  Exercise only as told by your doctor. Stop exercising if you start to have cramps in your womb.  Avoid heavy lifting.  Do not exercise if it is too hot or too humid, or if you are in a place of great height (high altitude).  If you choose to, you may have sex unless your doctor tells you not to. Relieving pain and  discomfort  Take breaks often, and rest with your legs raised (elevated) if you have leg cramps or low back pain.  Take warm water baths (sitz baths) to soothe pain or discomfort caused by hemorrhoids. Use hemorrhoid cream if your doctor approves.  Wear a good support bra if your breasts are tender.  If you develop bulging, swollen veins in your legs: ? Wear support hose as told by your doctor. ? Raise your feet for 15 minutes, 3-4 times a day. ? Limit salt in your food. Safety  Talk to your doctor before traveling far distances.  Do not use hot tubs, steam rooms, or saunas.  Wear your seat belt at all times when you are in a car.  Talk with your doctor if someone is hurting you or yelling at you a lot. Preparing for your baby's arrival To prepare for the arrival of your baby:  Take prenatal classes.  Visit the hospital and tour the maternity area.  Buy a rear-facing car seat. Learn how to install it in your car.  Prepare the baby's room. Take out all pillows and stuffed animals from the baby's crib. General instructions  Avoid cat litter boxes and soil used by cats. These carry germs that can cause harm to the baby and can cause a loss of your baby by miscarriage or stillbirth.  Do not douche or use tampons. Do not use scented sanitary pads.  Do not smoke or use any products that contain nicotine or tobacco. If you need help quitting, ask your doctor.  Do not drink alcohol.  Do not use herbal medicines, illegal drugs, or medicines that were not approved by your doctor. Chemicals in these products can affect your baby.  Keep all follow-up visits. This is important. Where to find more information  American Pregnancy Association: americanpregnancy.org  [employer]stetricians [employer] Gynecologists: www.acog.org  Office on Women's Health: MightyReward.co.nz Contact a doctor if:  You have a fever.  You have mild cramps or pressure in your lower  belly.  You have a nagging pain in your belly area.  You vomit, or you have watery poop (diarrhea).  You have bad-smelling fluid coming from your vagina.  You have pain when you pee, or your pee smells bad.  You have a headache that does not go away when you take medicine.  You have changes in how you see, or you see spots in front of your eyes. Get help right away if:  Your water breaks.  You have regular contractions that are less than 5 minutes apart.  You are spotting or bleeding from your vagina.  You have very bad belly cramps or pain.  You have trouble breathing.  You have chest pain.  You faint.  You have not felt the baby move for the amount of time told by  your doctor.  You have new or increased pain, swelling, or redness in an arm or leg. Summary  The third trimester is from week 28 through week 40 (months 7 through 9). This is the time when your unborn baby is growing very fast.  During this time, your discomfort may increase as you gain weight and as your baby grows.  Get ready for your baby to arrive by taking prenatal classes, buying a rear-facing car seat, and preparing the baby's room.  Get help right away if you are bleeding from your vagina, you have chest pain and trouble breathing, or you have not felt the baby move for the amount of time told by your doctor. This information is not intended to replace advice given to you by your health care provider. Make sure you discuss any questions you have with your health care provider. Document Revised: 10/24/2019 Document Reviewed: 08/30/2019 Elsevier Patient Education  2021 [employer].

## 2020-09-19 ENCOUNTER — Encounter: Payer: Medicaid Other | Admitting: Family Medicine

## 2020-09-22 ENCOUNTER — Ambulatory Visit (INDEPENDENT_AMBULATORY_CARE_PROVIDER_SITE_OTHER): Payer: Medicaid Other | Admitting: Family Medicine

## 2020-09-22 ENCOUNTER — Other Ambulatory Visit: Payer: Self-pay | Admitting: Obstetrics and Gynecology

## 2020-09-22 ENCOUNTER — Other Ambulatory Visit: Payer: Self-pay

## 2020-09-22 VITALS — BP 98/60 | HR 78 | Wt 145.6 lb

## 2020-09-22 DIAGNOSIS — Z3403 Encounter for supervision of normal first pregnancy, third trimester: Secondary | ICD-10-CM

## 2020-09-22 NOTE — Progress Notes (Signed)
  [facility] Prenatal Visit  Jacqueline Miranda is a 20 y.o. G1P0 at [redacted]w[redacted]d here for routine follow up. She is dated by early ultrasound.  She reports backache and uterine pain several times a day that have increased in frequency since her last visit. She reports fetal movement. She denies vaginal bleeding, contractions, or loss of fluid. See flow sheet for details.   Vitals:   09/22/20 0918  BP: 98/60  Pulse: 78    A/P: Pregnancy at [redacted]w[redacted]d.  Doing well.   1. Routine prenatal care:  Marland Kitchen Dating reviewed, dating tab is correct . Fetal heart tones Appropriate HR 153 bpm . Fundal height within expected range.  38cm  . Fetal position confirmed Vertex using Leopold's .  Marland Kitchen Infant feeding choice: Breastfeeding and formula feeding  . Contraception choice: None- no intercourse because husband it out of the [place]  . Infant circumcision desired not applicable . Influenza vaccine previously administered.   . Tdap previously administered between 27-36 weeks  . GBS and gc/chlamydia testing results were not reviewed today.   . Pregnancy education regarding labor, fetal movement,  benefits of breastfeeding, contraception, and safe infant sleep were discussed.  . Labor and fetal movement precautions reviewed. . Induction of labor discussed. Scheduling for induction at approximately 41 weeks. BPP scheduled at [redacted]w[redacted]d on 5/4  2. Pregnancy issues include the following and were addressed as appropriate today: . Problem list and pregnancy box updated: Yes.   Follow up 1 week.   BPP Ultrasound 10/01/2020 Show-time at [facility] [address] [phone]

## 2020-09-22 NOTE — Patient Instructions (Addendum)
It was great seeing you today!  Please check-out at the front desk before leaving the clinic. I'd like to see you back in 1 week for your next follow up.   Please go to the Maternal Assessment Unit if you feel decreased movement, have vaginal bleeding, leakage of fluid or contractions.  If you haven't already, sign up for My Chart to have easy access to your labs results, and communication with your primary care physician.  Feel free to call with any questions or concerns at any time, at [phone].   Take care,  Dr. Cora Collum [facility]

## 2020-09-23 ENCOUNTER — Telehealth: Payer: Self-pay

## 2020-09-23 ENCOUNTER — Ambulatory Visit: Payer: Medicaid Other

## 2020-09-23 ENCOUNTER — Other Ambulatory Visit: Payer: Self-pay | Admitting: Family Medicine

## 2020-09-23 ENCOUNTER — Other Ambulatory Visit: Payer: Medicaid Other

## 2020-09-23 DIAGNOSIS — Z363 Encounter for antenatal screening for malformations: Secondary | ICD-10-CM

## 2020-09-23 NOTE — Telephone Encounter (Signed)
Called patient using [address] [place] ID # [phone].    BPP Ultrasound 10/01/2020 Show-time at [facility] [address] [phone]  ONLY available time for next week.  Patient verbalized understanding.  Glennie Hawk, CMA

## 2020-09-29 ENCOUNTER — Encounter (HOSPITAL_COMMUNITY): Payer: Self-pay | Admitting: Obstetrics & Gynecology

## 2020-09-29 ENCOUNTER — Inpatient Hospital Stay (HOSPITAL_COMMUNITY)
Admission: AD | Admit: 2020-09-29 | Discharge: 2020-10-01 | DRG: 806 | Disposition: A | Discharge status: Home / Self Care | Payer: Medicaid Other | Attending: Family Medicine | Admitting: Family Medicine

## 2020-09-29 ENCOUNTER — Inpatient Hospital Stay (HOSPITAL_COMMUNITY): Payer: Medicaid Other | Admitting: Anesthesiology

## 2020-09-29 ENCOUNTER — Other Ambulatory Visit: Payer: Self-pay

## 2020-09-29 DIAGNOSIS — O9902 Anemia complicating childbirth: Secondary | ICD-10-CM | POA: Diagnosis not present

## 2020-09-29 DIAGNOSIS — G8918 Other acute postprocedural pain: Secondary | ICD-10-CM | POA: Diagnosis not present

## 2020-09-29 DIAGNOSIS — Z20822 Contact with and (suspected) exposure to covid-19: Secondary | ICD-10-CM | POA: Diagnosis present

## 2020-09-29 DIAGNOSIS — O480 Post-term pregnancy: Secondary | ICD-10-CM | POA: Diagnosis not present

## 2020-09-29 DIAGNOSIS — O864 Pyrexia of unknown origin following delivery: Secondary | ICD-10-CM | POA: Diagnosis not present

## 2020-09-29 DIAGNOSIS — O26893 Other specified pregnancy related conditions, third trimester: Secondary | ICD-10-CM | POA: Diagnosis present

## 2020-09-29 DIAGNOSIS — Z3493 Encounter for supervision of normal pregnancy, unspecified, third trimester: Secondary | ICD-10-CM

## 2020-09-29 DIAGNOSIS — O99355 Diseases of the nervous system complicating the puerperium: Secondary | ICD-10-CM | POA: Diagnosis not present

## 2020-09-29 DIAGNOSIS — D649 Anemia, unspecified: Secondary | ICD-10-CM | POA: Diagnosis not present

## 2020-09-29 DIAGNOSIS — Z3A40 40 weeks gestation of pregnancy: Secondary | ICD-10-CM

## 2020-09-29 DIAGNOSIS — O9903 Anemia complicating the puerperium: Secondary | ICD-10-CM | POA: Diagnosis not present

## 2020-09-29 HISTORY — DX: Anemia, unspecified: D64.9

## 2020-09-29 LAB — CBC
HCT: 34.1 % — ABNORMAL LOW (ref 36.0–46.0)
Hemoglobin: 11.7 g/dL — ABNORMAL LOW (ref 12.0–15.0)
MCH: 29.8 pg (ref 26.0–34.0)
MCHC: 34.3 g/dL (ref 30.0–36.0)
MCV: 87 fL (ref 80.0–100.0)
Platelets: 219 10*3/uL (ref 150–400)
RBC: 3.92 MIL/uL (ref 3.87–5.11)
RDW: 13.6 % (ref 11.5–15.5)
WBC: 11.7 10*3/uL — ABNORMAL HIGH (ref 4.0–10.5)
nRBC: 0 % (ref 0.0–0.2)

## 2020-09-29 LAB — TYPE AND SCREEN
ABO/RH(D): B POS
Antibody Screen: NEGATIVE

## 2020-09-29 LAB — RESP PANEL BY RT-PCR (FLU A&B, COVID) ARPGX2
Influenza A by PCR: NEGATIVE
Influenza B by PCR: NEGATIVE
SARS Coronavirus 2 by RT PCR: NEGATIVE

## 2020-09-29 LAB — RPR: RPR Ser Ql: NONREACTIVE

## 2020-09-29 MED ORDER — OXYTOCIN-SODIUM CHLORIDE 30-0.9 UT/500ML-% IV SOLN
2.5000 [IU]/h | INTRAVENOUS | Status: DC
  Administered 2020-09-29: 2.5 [IU]/h via INTRAVENOUS
  Filled 2020-09-29: qty 500

## 2020-09-29 MED ORDER — ACETAMINOPHEN 325 MG PO TABS
650.0000 mg | ORAL_TABLET | ORAL | Status: DC | PRN
  Administered 2020-09-29: 650 mg via ORAL
  Filled 2020-09-29: qty 2

## 2020-09-29 MED ORDER — EPHEDRINE 5 MG/ML INJ: 10.0000 mg | INTRAVENOUS | Status: DC | PRN

## 2020-09-29 MED ORDER — LACTATED RINGERS IV SOLN
500.0000 mL | INTRAVENOUS | Status: DC | PRN
  Administered 2020-09-29: 500 mL via INTRAVENOUS

## 2020-09-29 MED ORDER — LIDOCAINE HCL (PF) 1 % IJ SOLN: 30.0000 mL | INTRAMUSCULAR | Status: DC | PRN

## 2020-09-29 MED ORDER — TRANEXAMIC ACID-NACL 1000-0.7 MG/100ML-% IV SOLN
INTRAVENOUS | Status: AC
  Filled 2020-09-29: qty 100

## 2020-09-29 MED ORDER — PHENYLEPHRINE 40 MCG/ML (10ML) SYRINGE FOR IV PUSH (FOR BLOOD PRESSURE SUPPORT)
80.0000 ug | PREFILLED_SYRINGE | INTRAVENOUS | Status: DC | PRN
  Filled 2020-09-29: qty 10

## 2020-09-29 MED ORDER — SOD CITRATE-CITRIC ACID 500-334 MG/5ML PO SOLN: 30.0000 mL | ORAL | Status: DC | PRN

## 2020-09-29 MED ORDER — OXYTOCIN BOLUS FROM INFUSION
333.0000 mL | Freq: Once | INTRAVENOUS | Status: AC
  Administered 2020-09-29: 333 mL via INTRAVENOUS

## 2020-09-29 MED ORDER — OXYCODONE-ACETAMINOPHEN 5-325 MG PO TABS: 2.0000 | ORAL_TABLET | ORAL | Status: DC | PRN

## 2020-09-29 MED ORDER — OXYTOCIN-SODIUM CHLORIDE 30-0.9 UT/500ML-% IV SOLN
1.0000 m[IU]/min | INTRAVENOUS | Status: DC
  Administered 2020-09-29: 2 m[IU]/min via INTRAVENOUS

## 2020-09-29 MED ORDER — FENTANYL CITRATE (PF) 100 MCG/2ML IJ SOLN
50.0000 ug | INTRAMUSCULAR | Status: DC | PRN
Start: 2020-09-29 — End: 2020-09-30
  Administered 2020-09-29: 100 ug via INTRAVENOUS
  Filled 2020-09-29: qty 2

## 2020-09-29 MED ORDER — MISOPROSTOL 200 MCG PO TABS
ORAL_TABLET | ORAL | Status: AC
  Administered 2020-09-29: 800 ug via RECTAL
  Filled 2020-09-29: qty 4

## 2020-09-29 MED ORDER — MISOPROSTOL 200 MCG PO TABS: 800.0000 ug | ORAL_TABLET | Freq: Once | ORAL | Status: AC

## 2020-09-29 MED ORDER — MISOPROSTOL 200 MCG PO TABS: 800.0000 ug | ORAL_TABLET | Freq: Once | ORAL | Status: DC

## 2020-09-29 MED ORDER — ONDANSETRON HCL 4 MG/2ML IJ SOLN
4.0000 mg | Freq: Four times a day (QID) | INTRAMUSCULAR | Status: DC | PRN
  Administered 2020-09-29: 4 mg via INTRAVENOUS
  Filled 2020-09-29: qty 2

## 2020-09-29 MED ORDER — DIPHENHYDRAMINE HCL 50 MG/ML IJ SOLN: 12.5000 mg | INTRAMUSCULAR | Status: DC | PRN

## 2020-09-29 MED ORDER — OXYCODONE-ACETAMINOPHEN 5-325 MG PO TABS: 1.0000 | ORAL_TABLET | ORAL | Status: DC | PRN

## 2020-09-29 MED ORDER — LACTATED RINGERS IV SOLN
500.0000 mL | Freq: Once | INTRAVENOUS | Status: AC
  Administered 2020-09-29: 500 mL via INTRAVENOUS

## 2020-09-29 MED ORDER — TERBUTALINE SULFATE 1 MG/ML IJ SOLN: 0.2500 mg | Freq: Once | INTRAMUSCULAR | Status: DC | PRN

## 2020-09-29 MED ORDER — TRANEXAMIC ACID-NACL 1000-0.7 MG/100ML-% IV SOLN
1000.0000 mg | INTRAVENOUS | Status: AC
  Administered 2020-09-29: 1000 mg via INTRAVENOUS

## 2020-09-29 MED ORDER — FENTANYL-BUPIVACAINE-NACL 0.5-0.125-0.9 MG/250ML-% EP SOLN
12.0000 mL/h | EPIDURAL | Status: DC | PRN
  Filled 2020-09-29: qty 250

## 2020-09-29 MED ORDER — LACTATED RINGERS IV SOLN: INTRAVENOUS | Status: DC

## 2020-09-29 NOTE — Progress Notes (Signed)
Jacqueline Miranda is a 20 y.o. G1P0 at [redacted]w[redacted]d admitted for SOL  Subjective: Pt comfortable with epidural. No complaints.   Objective: BP 115/69   Pulse 86   Temp 98.5 F (36.9 C) (Oral)   Resp 18   LMP 04/09/2020 (Exact Date)   SpO2 100%  I/O last 3 completed shifts: In: -  Out: 1350 [Urine:1350] No intake/output data recorded.  FHT: FHR: 140, minimal variability, no accels, no decels UC: Q 1.5-44mins SVE:   Dilation: 10 Effacement (%): 100 Station: Plus 1 Exam by:: Camelia Eng, CNM  Labs: Lab Results  Component Value Date   WBC 11.7 (H) 09/29/2020   HGB 11.7 (L) 09/29/2020   HCT 34.1 (L) 09/29/2020   MCV 87.0 09/29/2020   PLT 219 09/29/2020    Assessment / Plan: Jacqueline Miranda 20 y.o. G1P0 at [redacted]w[redacted]d   Labor: Laboring down. Will recheck at 2 hours or sooner if station changes.  Fetal Wellbeing:  Category I Pain Control:  Epidural I/D:  GBS negative Anticipated MOD:  NSVD  [place] 09/29/2020, 8:43 PM

## 2020-09-29 NOTE — MAU Note (Signed)
Started contracting yesterday, are every 5 min now.  Reports bleedin, no ROM. No recent exam. +FM

## 2020-09-29 NOTE — H&P (Signed)
OBSTETRIC ADMISSION HISTORY AND PHYSICAL  Jacqueline Miranda is a 20 y.o. female G1P0 with IUP at [redacted]w[redacted]d by LMP confirmed on 18 wk U/S presenting for SOL. She reports +FMs, No LOF, no VB, no blurry vision, headaches or peripheral edema, and RUQ pain.  She plans on breastfeeding. She is declining contraception. She received her prenatal care at [facility]   Dating: By LMP --->  Estimated Date of Delivery: 09/29/20  Sono:    @[redacted]w[redacted]d , CWD, normal anatomy, Cephalic presentation, Anterior lie, g, 35% EFW   Prenatal History/Complications: Late Prenatal care  Past Medical History: Past Medical History:  Diagnosis Date  . Anemia     Past Surgical History: Past Surgical History:  Procedure Laterality Date  . TONSILLECTOMY      Obstetrical History: OB History    Gravida  1   Para      Term      Preterm      AB      Living        SAB      IAB      Ectopic      Multiple      Live Births              Social History Social History   Socioeconomic History  . Marital status: Single    Spouse name: Not on file  . Number of children: Not on file  . Years of education: Not on file  . Highest education level: Not on file  Occupational History  . Not on file  Tobacco Use  . Smoking status: Never Smoker  . Smokeless tobacco: Never Used  Vaping Use  . Vaping Use: Never used  Substance and Sexual Activity  . Alcohol use: Never  . Drug use: Never  . Sexual activity: Not Currently  Other Topics Concern  . Not on file  Social History Narrative   Father is Arob   Mother is [place] Scientist, physiological       Refugee Information   Number of Immediate Family Members: 8   Number of Immediate Family Members in Careers information officer: 8   Date of Arrival: 02/12/20   Country of Birth: [birth year]   Other Country of Origin:: [phone]   Location of Refugee [place]: Other   Other Location of Refugee Camp:: [place]   Duration in [place]: 11-15 years   Reason for Leaving Home Country: Religion   Primary  Language: Other   Other Primary Language:: D[place]a   Able to Read in Primary Language: No   Able to Write in Primary Language: No   Education: Primary School   Marital Status: Single   Tuberculosis Screening Overseas: Negative   Health Department Labs Completed: Yes   Do You Feel Jumpy or Nervous?: No   Are You Very Watchful or 'Super Alert'?: No   Social Determinants of Health   Financial Resource Strain: Not on file  Food Insecurity: Not on file  Transportation Needs: Not on file  Physical Activity: Not on file  Stress: Not on file  Social Connections: Not on file    Family History: Family History  Problem Relation Age of Onset  . Hepatitis B Father   . Asthma Mother     Allergies: No Known Allergies  Medications Prior to Admission  Medication Sig Dispense Refill Last Dose  . ferrous sulfate 325 (65 FE) MG tablet Take 1 tablet (325 mg total) by mouth daily. 30 tablet 6 09/28/2020 at Unknown time  .  Prenatal Vit-Fe Fumarate-FA (PRENATAL COMPLETE) 14-0.4 MG TABS Take 1 tablet by mouth daily. 60 tablet 5 09/28/2020 at Unknown time  . Saline 0.9 % AERS Place 1 spray into the nose as needed (for dry nose). 126 mL 0 Past Month at Unknown time   Review of Systems   All systems reviewed and negative except as stated in HPI  Blood pressure 119/78, pulse 61, temperature 98.4 F (36.9 C), temperature source Oral, resp. rate 18, last menstrual period 04/09/2020, SpO2 100 %. General appearance: alert, cooperative and mild distress Lungs: clear to auscultation bilaterally Heart: regular rate and rhythm Abdomen: soft, non-tender; bowel sounds normal Extremities: Homans sign is negative, no sign of DVT DTR's +1 Presentation: vertex Fetal monitoring: FHR 140 bpm, moderate variability, +accels, no decels Uterine activity: Q4 mins Dilation: 3.5 Effacement (%): 100 Station: -2 Exam by:: jolynn   Prenatal labs: ABO, Rh: B/Positive/-- (12/09 1536) Antibody: Negative (01/27  1345) Rubella: 8.19 (01/10 1531) RPR: Reactive (02/08 1215)  HBsAg: Negative (11/22 1152)  HIV: Non Reactive (02/08 1215)  GBS: Negative/-- (04/07 1330)  1 hr Glucola Normal Genetic screening  Normal Anatomy US Normal  Prenatal Transfer Tool  Maternal Diabetes: No Genetic Screening: Normal Maternal Ultrasounds/Referrals: Normal Fetal Ultrasounds or other Referrals:  None Maternal Substance Abuse:  No Significant Maternal Medications:  None Significant Maternal Lab Results: Group B Strep negative  No results found for this or any previous visit (from the past 24 hour(s)).  Patient Active Problem List   Diagnosis Date Noted  . Dry nose 09/11/2020  . Acute reaction to situational stress 09/04/2020  . 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  . Anemia in pregnancy 06/28/2020  . Supervision of low-risk first pregnancy 04/21/2020  . Toothache 04/21/2020  . Leg pain, bilateral 04/21/2020  . Blurred vision 04/21/2020  . Refugee health examination 03/31/2020    Assessment/Plan:  [name] is a 20 y.o. G1P0 at [redacted]w[redacted]d here for SOL.    #Labor: Cervical change from 2cm to 3.5cm. Expectant management. Augment with Pitocin and/or AROM prn #Pain: Desires epidural #FWB: Category 1 #ID: GBS negative. #MOF: Breast #MOC: declines #Circ: n/a #Late Prenatal Care: plan SW consult.  Camelia Eng, CNM  09/29/2020, 9:51 AM

## 2020-09-29 NOTE — Progress Notes (Signed)
Jacqueline Miranda is a 20 y.o. G1P0 at [redacted]w[redacted]d admitted for SOL  Subjective: Pt comfortable with epidural. No complaints.   Objective: BP 114/64   Pulse (!) 57   Temp 97.9 F (36.6 C) (Oral)   Resp 16   LMP 04/09/2020 (Exact Date)   SpO2 100%  No intake/output data recorded. No intake/output data recorded.  FHT: FHR: 135, minimal/moderate variability, +accels, no decels UC: Q 2-65mins SVE:   Dilation: 4 Effacement (%): 90,100 Station: -2 Exam by:: h stone rnc  Labs: Lab Results  Component Value Date   WBC 11.7 (H) 09/29/2020   HGB 11.7 (L) 09/29/2020   HCT 34.1 (L) 09/29/2020   MCV 87.0 09/29/2020   PLT 219 09/29/2020    Assessment / Plan: Jacqueline Miranda 20 y.o. G1P0 at [redacted]w[redacted]d   Labor: cervix unchanged, will start Pitocin and titrate per unit policy Fetal Wellbeing:  Category I Pain Control:  Epidural I/D:  GBS negative Anticipated MOD:  NSVD  Jacqueline Miranda, CNM 09/29/2020, 3:19 PM

## 2020-09-29 NOTE — Anesthesia Preprocedure Evaluation (Signed)
Anesthesia Evaluation  Patient identified by MRN, date of birth, ID band Patient awake    Reviewed: Allergy & Precautions, Patient's Chart, lab work & pertinent test results  Airway Mallampati: II  TM Distance: >3 FB Neck ROM: Full    Dental no notable dental hx.    Pulmonary neg pulmonary ROS,    Pulmonary exam normal breath sounds clear to auscultation       Cardiovascular negative cardio ROS Normal cardiovascular exam Rhythm:Regular Rate:Normal     Neuro/Psych PSYCHIATRIC DISORDERS Anxiety negative neurological ROS     GI/Hepatic negative GI ROS, Neg liver ROS,   Endo/Other  negative endocrine ROS  Renal/GU negative Renal ROS  negative genitourinary   Musculoskeletal negative musculoskeletal ROS (+)   Abdominal   Peds negative pediatric ROS (+)  Hematology  (+) Blood dyscrasia, anemia , hct 34.1, plt 219   Anesthesia Other Findings   Reproductive/Obstetrics (+) Pregnancy                             Anesthesia Physical Anesthesia Plan  ASA: II and emergent  Anesthesia Plan: Epidural   Post-op Pain Management:    Induction:   PONV Risk Score and Plan: 2  Airway Management Planned: Natural Airway  Additional Equipment: None  Intra-op Plan:   Post-operative Plan:   Informed Consent: I have reviewed the patients History and Physical, chart, labs and discussed the procedure including the risks, benefits and alternatives for the proposed anesthesia with the patient or authorized representative who has indicated his/her understanding and acceptance.       Plan Discussed with:   Anesthesia Plan Comments:         Anesthesia Quick Evaluation

## 2020-09-29 NOTE — Progress Notes (Signed)
Joliene Salvador is a 20 y.o. G1P0 at [redacted]w[redacted]d admitted for SOL  Subjective: Pt comfortable with epidural. Has been using peanut ball.   Objective: BP 105/60   Pulse 65   Temp 97.7 F (36.5 C) (Oral)   Resp 17   LMP 04/09/2020 (Exact Date)   SpO2 100%  No intake/output data recorded. Total I/O In: -  Out: 1350 [Urine:1350]  FHT: FHR: 145, minimal/moderate variability, +accels, no decels UC: Q 1.5-14mins SVE:   Dilation: 5 Effacement (%): 100 Station: -2 Exam by:: h stone rnc  Labs: Lab Results  Component Value Date   WBC 11.7 (H) 09/29/2020   HGB 11.7 (L) 09/29/2020   HCT 34.1 (L) 09/29/2020   MCV 87.0 09/29/2020   PLT 219 09/29/2020    Assessment / Plan: [name] 20 y.o. G1P0 at [redacted]w[redacted]d   Labor: Progressing normally on Pitocin. Titrate prn per unit policy. Consider AROM prn Fetal Wellbeing:  Category I Pain Control:  Epidural I/D:  GBS negative Anticipated MOD:  NSVD  Brand Males, CNM 09/29/2020, 6:29 PM

## 2020-09-29 NOTE — Discharge Summary (Signed)
Postpartum Discharge Summary    Patient Name: Jacqueline Miranda DOB: Jan 09, 2001 MRN: [id number]  Date of admission: 09/29/2020 Delivery date:09/29/2020  Delivering provider: Christin Fudge  Date of discharge: 09/29/2020  Admitting diagnosis: Supervision of low-risk pregnancy, third trimester [Z34.93] Intrauterine pregnancy: [redacted]w[redacted]d    Secondary diagnosis:  Active Problems:   Supervision of low-risk pregnancy, third trimester  Additional problems: none    Discharge diagnosis: Term Pregnancy Delivered                                              Post partum procedures:none Augmentation: Pitocin Complications: None  Hospital course: Onset of Labor With Vaginal Delivery      20y.o. yo G1P0 at 452w0das admitted in Latent Labor on 09/29/2020. Patient had an uncomplicated labor course as follows:  Membrane Rupture Time/Date: 10:40 PM ,09/29/2020   Delivery Method:Vaginal, Spontaneous  Episiotomy: None  Lacerations:  2nd degree  Patient had an uncomplicated postpartum course.  She is ambulating, tolerating a regular diet, passing flatus, and urinating well. Patient is discharged home in stable condition on 09/29/20.  Newborn Data: Birth date:09/29/2020  Birth time:11:16 PM  Gender:Female  Living status:Living  Apgars:8 ,9  Weight:3570 g   Magnesium Sulfate received: No BMZ received: No Rhophylac:N/A MMR:N/A T-DaP:Given prenatally Flu: N/A, given prenatally Transfusion:No  Physical exam  Vitals:   09/29/20 2230 09/29/20 2330 09/29/20 2345 09/29/20 2351  BP: 114/63 99/64 (!) 119/97   Pulse: 70 92 (!) 134   Resp: 16 16 16    Temp:    (!) 100.5 F (38.1 C)  TempSrc:    Axillary  SpO2:       General: alert, cooperative and no distress Lochia: appropriate Uterine Fundus: firm Incision: N/A DVT Evaluation: No evidence of DVT seen on physical exam. Negative Homan's sign. No cords or calf tenderness. No significant calf/ankle edema. Labs:  Lab Results  Component Value  Date   WBC 11.7 (H) 09/29/2020   HGB 11.7 (L) 09/29/2020   HCT 34.1 (L) 09/29/2020   MCV 87.0 09/29/2020   PLT 219 09/29/2020   CMP Latest Ref Rng & Units 04/21/2020  Glucose 65 - 99 mg/dL 78  BUN 6 - 20 mg/dL 6  Creatinine 0.57 - 1.00 mg/dL 0.53(L)  Sodium 134 - 144 mmol/L 135  Potassium 3.5 - 5.2 mmol/L 4.4  Chloride 96 - 106 mmol/L 100  CO2 20 - 29 mmol/L 20  Calcium 8.7 - 10.2 mg/dL 9.6  Total Protein 6.0 - 8.5 g/dL 7.2  Total Bilirubin 0.0 - 1.2 mg/dL 0.3  Alkaline Phos 44 - 121 IU/L 89  AST 0 - 40 IU/L 30  ALT 0 - 32 IU/L 24   Edinburgh Score: No flowsheet data found.   After visit meds:  Allergies as of 10/01/2020   No Known Allergies     Medication List    TAKE these medications   acetaminophen 325 MG tablet Commonly known as: Tylenol Take 2 tablets (650 mg total) by mouth every 4 (four) hours as needed (for pain scale < 4).   benzocaine-Menthol 20-0.5 % Aero Commonly known as: DERMOPLAST Apply 1 application topically as needed for irritation (perineal discomfort).   docusate sodium 100 MG capsule Commonly known as: COLACE Take 1 capsule (100 mg total) by mouth 2 (two) times daily.   ferrous sulfate 325 (65 FE) MG tablet Take  1 tablet (325 mg total) by mouth daily.   ibuprofen 600 MG tablet Commonly known as: ADVIL Take 1 tablet (600 mg total) by mouth every 6 (six) hours.   Prenatal Complete 14-0.4 MG Tabs Take 1 tablet by mouth daily.   Saline 0.9 % Aers Place 1 spray into the nose as needed (for dry nose).   witch hazel-glycerin pad Commonly known as: TUCKS Apply 1 application topically as needed for hemorrhoids.      Discharge home in stable condition Infant Feeding: Breast Infant Disposition:home with mother Discharge instruction: per After Visit Summary and Postpartum booklet. Activity: Advance as tolerated. Pelvic rest for 6 weeks.  Diet: routine diet Future Appointments:No future appointments. Follow up Visit:  Please schedule  this patient for a In person postpartum visit in 4 weeks with the following provider: Any provider. Additional Postpartum F/U: n/a Low risk pregnancy complicated by:  Delivery mode:  Vaginal, Spontaneous  Anticipated Birth Control:  none    Renee Harder, MSN, CNM 10/01/20 10:47 AM

## 2020-09-29 NOTE — Anesthesia Procedure Notes (Signed)
Epidural Patient location during procedure: OB Start time: 09/29/2020 12:03 PM End time: 09/29/2020 12:12 PM  Staffing Anesthesiologist: Lannie Fields, DO Performed: anesthesiologist   Preanesthetic Checklist Completed: patient identified, IV checked, risks and benefits discussed, monitors and equipment checked, pre-op evaluation and timeout performed  Epidural Patient position: sitting Prep: DuraPrep and site prepped and draped Patient monitoring: continuous pulse ox, blood pressure, heart rate and cardiac monitor Approach: midline Location: L3-L4 Injection technique: LOR air  Needle:  Needle type: Tuohy  Needle gauge: 17 G Needle length: 9 cm Needle insertion depth: 7 cm Catheter type: closed end flexible Catheter size: 19 Gauge Catheter at skin depth: 12 cm Test dose: negative  Assessment Sensory level: T8 Events: blood not aspirated, injection not painful, no injection resistance, no paresthesia and negative IV test  Additional Notes EXTREMELY poor patient cooperation to the point that epidural placement was unsafe during first attempt. Instructed nurse to medicate w/ IV fentanyl and we would retry later. Successfully placed w/ assistance of patient's mother and IV fentanyl on second attempt.Reason for block:procedure for pain

## 2020-09-30 ENCOUNTER — Encounter: Payer: Medicaid Other | Admitting: Family Medicine

## 2020-09-30 ENCOUNTER — Encounter (HOSPITAL_COMMUNITY): Payer: Self-pay | Admitting: Family Medicine

## 2020-09-30 DIAGNOSIS — O864 Pyrexia of unknown origin following delivery: Secondary | ICD-10-CM

## 2020-09-30 MED ORDER — COCONUT OIL OIL: 1.0000 "application " | TOPICAL_OIL | Status: DC | PRN

## 2020-09-30 MED ORDER — ACETAMINOPHEN 325 MG PO TABS
650.0000 mg | ORAL_TABLET | ORAL | Status: DC | PRN
  Administered 2020-09-30: 650 mg via ORAL
  Filled 2020-09-30: qty 2

## 2020-09-30 MED ORDER — IBUPROFEN 600 MG PO TABS
600.0000 mg | ORAL_TABLET | Freq: Four times a day (QID) | ORAL | Status: DC
  Administered 2020-09-30 – 2020-10-01 (×7): 600 mg via ORAL
  Filled 2020-09-30 (×6): qty 1

## 2020-09-30 MED ORDER — ONDANSETRON HCL 4 MG/2ML IJ SOLN: 4.0000 mg | INTRAMUSCULAR | Status: DC | PRN

## 2020-09-30 MED ORDER — PRENATAL MULTIVITAMIN CH
1.0000 | ORAL_TABLET | Freq: Every day | ORAL | Status: DC
  Administered 2020-09-30 – 2020-10-01 (×2): 1 via ORAL
  Filled 2020-09-30: qty 1

## 2020-09-30 MED ORDER — MEDROXYPROGESTERONE ACETATE 150 MG/ML IM SUSP: 150.0000 mg | INTRAMUSCULAR | Status: DC | PRN

## 2020-09-30 MED ORDER — WITCH HAZEL-GLYCERIN EX PADS: 1.0000 "application " | MEDICATED_PAD | CUTANEOUS | Status: DC | PRN

## 2020-09-30 MED ORDER — SIMETHICONE 80 MG PO CHEW: 80.0000 mg | CHEWABLE_TABLET | ORAL | Status: DC | PRN

## 2020-09-30 MED ORDER — DIBUCAINE (PERIANAL) 1 % EX OINT: 1.0000 "application " | TOPICAL_OINTMENT | CUTANEOUS | Status: DC | PRN

## 2020-09-30 MED ORDER — SODIUM CHLORIDE 0.9 % IV SOLN
2.0000 g | Freq: Once | INTRAVENOUS | Status: DC
  Filled 2020-09-30: qty 2

## 2020-09-30 MED ORDER — METHYLERGONOVINE MALEATE 0.2 MG PO TABS
0.2000 mg | ORAL_TABLET | ORAL | Status: DC | PRN
Start: 2020-09-30 — End: 2020-10-01

## 2020-09-30 MED ORDER — DOCUSATE SODIUM 100 MG PO CAPS
100.0000 mg | ORAL_CAPSULE | Freq: Two times a day (BID) | ORAL | Status: DC
  Administered 2020-09-30: 100 mg via ORAL
  Filled 2020-09-30: qty 1

## 2020-09-30 MED ORDER — BISACODYL 10 MG RE SUPP: 10.0000 mg | Freq: Every day | RECTAL | Status: DC | PRN

## 2020-09-30 MED ORDER — FERROUS SULFATE 325 (65 FE) MG PO TABS
325.0000 mg | ORAL_TABLET | ORAL | Status: DC
  Administered 2020-09-30: 325 mg via ORAL
  Filled 2020-09-30: qty 1

## 2020-09-30 MED ORDER — BENZOCAINE-MENTHOL 20-0.5 % EX AERO
1.0000 "application " | INHALATION_SPRAY | CUTANEOUS | Status: DC | PRN
  Administered 2020-09-30: 1 via TOPICAL
  Filled 2020-09-30: qty 56

## 2020-09-30 MED ORDER — ONDANSETRON HCL 4 MG PO TABS: 4.0000 mg | ORAL_TABLET | ORAL | Status: DC | PRN

## 2020-09-30 MED ORDER — TETANUS-DIPHTH-ACELL PERTUSSIS 5-2.5-18.5 LF-MCG/0.5 IM SUSY: 0.5000 mL | PREFILLED_SYRINGE | Freq: Once | INTRAMUSCULAR | Status: DC

## 2020-09-30 MED ORDER — METHYLERGONOVINE MALEATE 0.2 MG/ML IJ SOLN
0.2000 mg | INTRAMUSCULAR | Status: DC | PRN
Start: 2020-09-30 — End: 2020-10-01

## 2020-09-30 MED ORDER — FLEET ENEMA 7-19 GM/118ML RE ENEM: 1.0000 | ENEMA | Freq: Every day | RECTAL | Status: DC | PRN

## 2020-09-30 MED ORDER — DIPHENHYDRAMINE HCL 25 MG PO CAPS: 25.0000 mg | ORAL_CAPSULE | Freq: Four times a day (QID) | ORAL | Status: DC | PRN

## 2020-09-30 MED ORDER — MEASLES, MUMPS & RUBELLA VAC IJ SOLR: 0.5000 mL | Freq: Once | INTRAMUSCULAR | Status: DC

## 2020-09-30 NOTE — Lactation Note (Signed)
This note was copied from a baby's chart. Lactation Consultation Note  Patient Name: Jacqueline Miranda Date: 09/30/2020 Reason for consult: Follow-up assessment;1st time breastfeeding;Term Age:22 hours Arabic interpreter used # [phone] Vernona Rieger. Infant had one void and 4 stools. Per mom, she feels infant is latching well at the breast and most feedings are 20 to 30 minutes in length, afterwards mom has been supplementing infant with formula,  per mom, infant had 7 mls of formula . LC did not observe a latch, mom had finished feeding infant earlier 15 minutes prior to [facility] entering the room. Infant was asleep in the basinet. Per mom, she had fed infant twice out of the same bottle for two different feedings. LC discussed with mom that formula should be dispose of after one hour of use. Mom knows to call RN or LC if she needs assistance with latching infant at the breast. LC suggested to mom latch infant first  at the breast for every feeding before offering formula to help establish her milk supply.   Maternal Data    Feeding Mother's Current Feeding Choice: Breast Milk and Formula  LATCH Score Latch: Repeated attempts needed to sustain latch, nipple held in mouth throughout feeding, stimulation needed to elicit sucking reflex.  Audible Swallowing: Spontaneous and intermittent  Type of Nipple: Everted at rest and after stimulation  Comfort (Breast/Nipple): Filling, red/small blisters or bruises, mild/mod discomfort  Hold (Positioning): No assistance needed to correctly position infant at breast.  LATCH Score: 8   Lactation Tools Discussed/Used    Interventions    Discharge    Consult Status Consult Status: Follow-up Date: 10/01/20 Follow-up type: In-patient    Danelle Earthly 09/30/2020, 7:39 PM

## 2020-09-30 NOTE — Anesthesia Postprocedure Evaluation (Signed)
Anesthesia Post Note  Patient: Jacqueline Miranda  Procedure(s) Performed: AN AD HOC LABOR EPIDURAL     Patient location during evaluation: Mother Baby Anesthesia Type: Epidural Level of consciousness: awake and alert Pain management: pain level controlled Vital Signs Assessment: post-procedure vital signs reviewed and stable Respiratory status: spontaneous breathing, nonlabored ventilation and respiratory function stable Cardiovascular status: stable Postop Assessment: no headache, no backache, epidural receding, no apparent nausea or vomiting, patient able to bend at knees, adequate PO intake and able to ambulate Anesthetic complications: no   No complications documented.  Last Vitals:  Vitals:   09/30/20 0250 09/30/20 0530  BP: (!) 96/56 99/60  Pulse: 62 64  Resp: 17 18  Temp: 36.8 C 36.7 C  SpO2: 98% 100%    Last Pain:  Vitals:   09/30/20 0530  TempSrc: Oral  PainSc: 0-No pain   Pain Goal:                   [employer]

## 2020-09-30 NOTE — Progress Notes (Signed)
RN started pt x1 dose of antibiotics due to maternal temp after delivery. A few minutes after starting them the pt called out stating "her IV was burning". RN flushed IV with no resistance and tried slowing the infusion rate down. Pt still complained of a burning sensation. RN stopped infusion and notified CNM on call. Pt temperature is currently 98.3, RN will continue to monitor.    Herbert Moors, RN

## 2020-09-30 NOTE — Social Work (Signed)
CSW received consult for late and limited PNC and "refugee."  CSW reviewed chart and is screening out [facility] consult as it does not meet criteria for automatic CSW involvement and infant drug screening.  [name] started care prior to 28 weeks and had more than 3 visits.    CSW met with [name] to assess for any needs. CSW utilized [employer] [phone]. CSW introduced self and role. CSW observed [name]'s mother 'Jacqueline Miranda' present on couch and [name] holding infant 'Jacqueline Miranda." [name] stated her mother could remain in room for assessment. CSW assessed [name] current mood. [name] reported she is currently feeling well and had a good pregnancy. [name] denies any mental health history and identified her mother as her primary support. [name] denies any current SI or HI.  CSW provided review of Sudden Infant Death Syndrome (SIDS) precautions. [name] reported she has all essentials for infant, including a crib and car seat. [name] is still in the process of choosing a pediatrician and reported she has some transportation challenges. CSW noted [name] has used [employer] in the past and informed [name] the service can be utilized for infant's follow-up appointments. CSW informed [name] that transportation can be scheduled by CSW, once she has chosen a Lexicographer. [name] expressed understanding. [name] reported she receives [facility] benefits and was informed she can notify [facility] of infant's birth. [name] is open to a referral for family connects, to have a nurse follow-up postpartum. [name] expressed no additional needs at this time.   Darra Lis, MSW, [employer] [phone]

## 2020-09-30 NOTE — Progress Notes (Addendum)
POSTPARTUM PROGRESS NOTE  Post Partum Day 1  Subjective:  Jacqueline Miranda is a 20 y.o. G1P1001 s/p SVD at 109w0d  No acute events overnight.  Pt denies problems with ambulating, voiding or po intake.  She denies nausea or vomiting.  Pain is well controlled.  She has had flatus. She has had bowel movement.  Lochia Minimal.   Objective: Blood pressure (!) 96/56, pulse 62, temperature 98.3 F (36.8 C), temperature source Oral, resp. rate 17, last menstrual period 04/09/2020, SpO2 98 %, unknown if currently breastfeeding.  Physical Exam:  General: alert, cooperative and no distress Chest: no respiratory distress Heart:regular rate, distal pulses intact Abdomen: soft, nontender,  Uterine Fundus: firm, appropriately tender DVT Evaluation: No calf swelling or tenderness Extremities: No LE edema Skin: warm, dry  Recent Labs    09/29/20 1003  HGB 11.7*  HCT 34.1*    Assessment/Plan: Jacqueline Miranda a 20y.o. G1P1001 s/p SVD at 452w0d PPD#1 - Doing well PP Fever: 100.75F after delivery, experienced burning in IV with antibiotics so this was discontinued. Last temp was 98.75F, CTM.   Contraception: declines  Feeding: breast Late PNC: SW consult Dispo: Plan for discharge tomorrow.   LOS: 1 day   Jacqueline EconomyMedical Student 09/30/2020, 5:15 AM   I personally saw and evaluated the patient, performing the key elements of the service. I developed and verified the management plan that is described in the resident's/student's note, and I agree with the content with my edits above. VSS, HRR&R, Resp unlabored, Legs neg.  FrNigel BertholdCNM 09/30/2020 8:04 AM

## 2020-09-30 NOTE — Lactation Note (Signed)
This note was copied from a baby's chart. Lactation Consultation Note  Patient Name: Jacqueline Miranda CVELF'Y Date: 09/30/2020 Reason for consult: Initial assessment;Primapara;1st time breastfeeding;Term;Nipple pain/trauma;Other (Comment) (Arabic online interpreter - [employer] [phone]) Age: 22 hours old Moms feeding preference is breast / formula and baby has already had bottles.  Baby awake , and LC offered to changed diaper, ( transitional stool )  LC assisted with hand expressing and latching - Latch score 8 - still feeding at 25 mins and MBURN Vivi Martens aware to doc the final amount of time for feeding.  LC reviewed basics of breast feeding and mom receptive.  See below for steps for latching due to soreness.   Maternal Data Has patient been taught Hand Expression?: Yes Does the patient have breastfeeding experience prior to this delivery?: No  Feeding Mother's Current Feeding Choice: Breast Milk and Formula  LATCH Score Latch: Grasps breast easily, tongue down, lips flanged, rhythmical sucking.  Audible Swallowing: Spontaneous and intermittent  Type of Nipple: Everted at rest and after stimulation  Comfort (Breast/Nipple): Filling, red/small blisters or bruises, mild/mod discomfort  Hold (Positioning): Assistance needed to correctly position infant at breast and maintain latch.  LATCH Score: 8   Lactation Tools Discussed/Used Tools: Shells;Pump;Flanges Flange Size: 24 Breast pump type: Manual Pump Education: Setup, frequency, and cleaning Reason for Pumping: due to areola having edema and painful to compress - LC recommended prior to every feeding discomfort is better - breast massage, hand express, pre - pump and reverse pressure  Interventions Interventions: Breast feeding basics reviewed;Assisted with latch;Skin to skin;Breast massage;Hand express;Pre-pump if needed;Reverse pressure;Breast compression;Adjust position;Support pillows;Position options;Shells;Hand  pump;Education  Discharge Pump: Manual WIC Program: No  Consult Status Consult Status: Follow-up Date: 10/01/20 Follow-up type: In-patient    Matilde Sprang Lasaro Primm 09/30/2020, 11:38 AM

## 2020-09-30 NOTE — Plan of Care (Signed)
  Problem: Pain Managment: Goal: General experience of comfort will improve Outcome: Progressing   Problem: Safety: Goal: Ability to remain free from injury will improve Outcome: Progressing   Problem: Education: Goal: Knowledge of Childbirth will improve Outcome: Adequate for Discharge Goal: Ability to make informed decisions regarding treatment and plan of care will improve Outcome: Adequate for Discharge Goal: Ability to state and carry out methods to decrease the pain will improve Outcome: Adequate for Discharge Goal: Individualized Educational Video(s) Outcome: Adequate for Discharge   Problem: Coping: Goal: Ability to verbalize concerns and feelings about labor and delivery will improve Outcome: Adequate for Discharge   Problem: Life Cycle: Goal: Ability to make normal progression through stages of labor will improve Outcome: Adequate for Discharge Goal: Ability to effectively push during vaginal delivery will improve Outcome: Adequate for Discharge   Problem: Role Relationship: Goal: Will demonstrate positive interactions with the child Outcome: Adequate for Discharge   Problem: Safety: Goal: Risk of complications during labor and delivery will decrease Outcome: Adequate for Discharge   Problem: Pain Management: Goal: Relief or control of pain from uterine contractions will improve Outcome: Adequate for Discharge

## 2020-09-30 NOTE — Plan of Care (Signed)
  Problem: Education: Goal: Knowledge of Childbirth will improve 09/30/2020 0042 by Loni Dolly, RN Outcome: Completed/Met 09/30/2020 0041 by Loni Dolly, RN Outcome: Adequate for Discharge Goal: Ability to make informed decisions regarding treatment and plan of care will improve 09/30/2020 0042 by Loni Dolly, RN Outcome: Completed/Met 09/30/2020 0041 by Loni Dolly, RN Outcome: Adequate for Discharge Goal: Ability to state and carry out methods to decrease the pain will improve 09/30/2020 0042 by Loni Dolly, RN Outcome: Completed/Met 09/30/2020 0041 by Loni Dolly, RN Outcome: Adequate for Discharge Goal: Individualized Educational Video(s) 09/30/2020 0042 by Loni Dolly, RN Outcome: Completed/Met 09/30/2020 0041 by Loni Dolly, RN Outcome: Adequate for Discharge   Problem: Coping: Goal: Ability to verbalize concerns and feelings about labor and delivery will improve 09/30/2020 0042 by Loni Dolly, RN Outcome: Completed/Met 09/30/2020 0041 by Loni Dolly, RN Outcome: Adequate for Discharge   Problem: Life Cycle: Goal: Ability to make normal progression through stages of labor will improve 09/30/2020 0042 by Loni Dolly, RN Outcome: Completed/Met 09/30/2020 0041 by Loni Dolly, RN Outcome: Adequate for Discharge Goal: Ability to effectively push during vaginal delivery will improve 09/30/2020 0042 by Loni Dolly, RN Outcome: Completed/Met 09/30/2020 0041 by Loni Dolly, RN Outcome: Adequate for Discharge   Problem: Role Relationship: Goal: Will demonstrate positive interactions with the child 09/30/2020 0042 by Loni Dolly, RN Outcome: Completed/Met 09/30/2020 0041 by Loni Dolly, RN Outcome: Adequate for Discharge   Problem: Safety: Goal: Risk of complications during labor and delivery will decrease 09/30/2020 0042 by Loni Dolly, RN Outcome: Completed/Met 09/30/2020 0041 by Loni Dolly, RN Outcome: Adequate for Discharge   Problem: Pain Management: Goal:  Relief or control of pain from uterine contractions will improve 09/30/2020 0042 by Loni Dolly, RN Outcome: Completed/Met 09/30/2020 0041 by Loni Dolly, RN Outcome: Adequate for Discharge

## 2020-10-01 ENCOUNTER — Ambulatory Visit: Payer: Medicaid Other

## 2020-10-01 DIAGNOSIS — O99355 Diseases of the nervous system complicating the puerperium: Secondary | ICD-10-CM

## 2020-10-01 DIAGNOSIS — O9903 Anemia complicating the puerperium: Secondary | ICD-10-CM

## 2020-10-01 DIAGNOSIS — D649 Anemia, unspecified: Secondary | ICD-10-CM

## 2020-10-01 DIAGNOSIS — G8918 Other acute postprocedural pain: Secondary | ICD-10-CM

## 2020-10-01 MED ORDER — ACETAMINOPHEN 325 MG PO TABS: 650.0000 mg | ORAL_TABLET | ORAL | 0 refills | Status: DC | PRN

## 2020-10-01 MED ORDER — IBUPROFEN 600 MG PO TABS: 600.0000 mg | ORAL_TABLET | Freq: Four times a day (QID) | ORAL | 0 refills | Status: DC

## 2020-10-01 MED ORDER — BENZOCAINE-MENTHOL 20-0.5 % EX AERO: 1.0000 "application " | INHALATION_SPRAY | CUTANEOUS | 0 refills | Status: DC | PRN

## 2020-10-01 MED ORDER — WITCH HAZEL-GLYCERIN EX PADS: 1.0000 "application " | MEDICATED_PAD | CUTANEOUS | 12 refills | Status: DC | PRN

## 2020-10-01 MED ORDER — DOCUSATE SODIUM 100 MG PO CAPS: 100.0000 mg | ORAL_CAPSULE | Freq: Two times a day (BID) | ORAL | 0 refills | Status: DC

## 2020-10-01 NOTE — Progress Notes (Signed)
Interpreter used to explain "baby patient status" to patient; all questions answered.  Patient expressed understanding with interpreter.

## 2020-10-01 NOTE — Lactation Note (Addendum)
This note was copied from a baby's chart. ArabicLactation Consultation Note  Patient Name: Girl Ziaire Bieser QMVHQ'I Date: 10/01/2020  Arabic interpreter used # Philis Kendall [phone] Age:22 hours, term female infant with -3% weight loss, mom is breast and formula feeding infant. LC entered the room, mom sitting in chair with sleeping infant. Infant will stay another night due elevated billi level, mom is P1, and having pain with infant latching. Mom will work towards infant having a deeper latch and feeling comfort with the breastfeeding.  Mom will call LC to assist with helping infant latch at the next feeding, LC written name of white board and informed MBU Editor, commissioning). Mom has shells due to some areola edema and given comfort gels due to mild abrasions on nipples.  LC did not observe latch, per mom, RN assisted with latch earlier infant BF for 40 minutes and was supplemented with 7 mls of formula after the feeding with slow flow bottle nipple.  Maternal Data    Feeding    LATCH Score                    Lactation Tools Discussed/Used    Interventions    Discharge    Consult Status      Danelle Earthly 10/01/2020, 4:47 PM

## 2020-10-02 ENCOUNTER — Ambulatory Visit: Payer: Self-pay

## 2020-10-02 NOTE — Lactation Note (Signed)
This note was copied from a baby's chart. Lactation Consultation Note  Patient Name: Jacqueline Miranda XNATF'T Date: 10/02/2020 Reason for consult: Follow-up assessment;Engorgement;Primapara;1st time breastfeeding;Term;Infant weight loss;Difficult latch;Other (Comment) (Arabic [name] [phone] / mom also speaks English) Age:22 hours  Reported to [facility] mom is engorged both breast. As LC entered the room . Mom resting in bed and recently had iced with the 2 ice packs. The left breast borderline engorged and the right engorged back to the axillary area and complained the breast tissue was tender.  Attempted to latch the baby and baby not interested. LC hooked up the DEBP with #27 F for both sides. And the left breast started releasing 1st and then then 2nd breast released and mom pumped off 120 ml.  Both breast softened well and per mom comfortable.  LC reviewed and stressed the importance of engorgement prevention  And tx . Mom denied sore nipples.  Mom has a hand pump and when asked agin if active with WIC mom said yes and LC completed the referral form - sent.  LC recommended to call WIC - [place] to confirm she would need a DEBP  In case the abby was having difficulty latching.  Mom has a hand pump , per mom prefers the DEBP .  LC reassured mom now that she isn't engorged the hand pump will probably be more comfortable. Mom aware of storage of breast milk.  Mom aware of breast feeding resources and has the [facility] brochure.   Maternal Data Has patient been taught Hand Expression?: Yes  Feeding Mother's Current Feeding Choice: Breast Milk and Formula Nipple Type: Slow - flow  LATCH Score- when working on latching the baby mom mentioned she had just fed the baby 26 ml of formula. Baby took 5 ml from the bottle by the LC.  Latch: Repeated attempts needed to sustain latch, nipple held in mouth throughout feeding, stimulation needed to elicit sucking reflex.  Audible Swallowing: None  Type of Nipple:  Everted at rest and after stimulation  Comfort (Breast/Nipple): Engorged, cracked, bleeding, large blisters, severe discomfort  Hold (Positioning): Assistance needed to correctly position infant at breast and maintain latch.  LATCH Score: 4   Lactation Tools Discussed/Used Tools: Pump;Flanges;Comfort gels;Bottle Flange Size: 27 Breast pump type: Double-Electric Breast Pump;Manual Pump Education: Milk Storage Reason for Pumping: due to areola edema and engorgment - LC recommended prior to feeding - breast massage, hand express, pre-pump Pumped volume: 120 mL  Interventions Interventions: Breast feeding basics reviewed;Assisted with latch;Skin to skin;Breast massage;Hand express;Pre-pump if needed;Breast compression;Adjust position;Support pillows;Position options;Expressed milk;Comfort gels;Hand pump;DEBP;Education  Discharge Discharge Education: Engorgement and breast care;Warning signs for feeding baby Pump: Manual;DEBP WIC Program: Yes (per mom)  Consult Status Consult Status: Complete Date: 10/02/20    Kathrin Greathouse 10/02/2020, 10:58 AM

## 2020-10-03 ENCOUNTER — Other Ambulatory Visit: Payer: Self-pay | Admitting: Family Medicine

## 2020-10-03 DIAGNOSIS — K5900 Constipation, unspecified: Secondary | ICD-10-CM

## 2020-10-03 MED ORDER — POLYETHYLENE GLYCOL 3350 17 GM/SCOOP PO POWD: 17.0000 g | Freq: Two times a day (BID) | ORAL | 1 refills | Status: DC | PRN

## 2020-10-03 NOTE — Progress Notes (Signed)
mira

## 2020-10-06 ENCOUNTER — Other Ambulatory Visit: Payer: Self-pay | Admitting: Family Medicine

## 2020-10-06 DIAGNOSIS — D500 Iron deficiency anemia secondary to blood loss (chronic): Secondary | ICD-10-CM

## 2020-10-06 MED ORDER — FERROUS SULFATE 325 (65 FE) MG PO TABS: 325.0000 mg | ORAL_TABLET | Freq: Every day | ORAL | 6 refills | Status: DC

## 2020-12-23 DIAGNOSIS — Z23 Encounter for immunization: Secondary | ICD-10-CM | POA: Diagnosis not present

## 2021-05-11 ENCOUNTER — Ambulatory Visit: Payer: Self-pay | Admitting: Family Medicine

## 2021-05-19 ENCOUNTER — Other Ambulatory Visit: Payer: Self-pay

## 2021-05-19 ENCOUNTER — Encounter: Payer: Self-pay | Admitting: Family Medicine

## 2021-05-19 ENCOUNTER — Ambulatory Visit (INDEPENDENT_AMBULATORY_CARE_PROVIDER_SITE_OTHER): Payer: Medicaid Other | Admitting: Family Medicine

## 2021-05-19 DIAGNOSIS — H538 Other visual disturbances: Secondary | ICD-10-CM | POA: Diagnosis not present

## 2021-05-19 DIAGNOSIS — G8929 Other chronic pain: Secondary | ICD-10-CM | POA: Insufficient documentation

## 2021-05-19 DIAGNOSIS — M545 Low back pain, unspecified: Secondary | ICD-10-CM

## 2021-05-19 DIAGNOSIS — M549 Dorsalgia, unspecified: Secondary | ICD-10-CM | POA: Insufficient documentation

## 2021-05-19 DIAGNOSIS — O99019 Anemia complicating pregnancy, unspecified trimester: Secondary | ICD-10-CM | POA: Diagnosis not present

## 2021-05-19 MED ORDER — NAPROXEN 500 MG PO TABS: 500.0000 mg | ORAL_TABLET | Freq: Two times a day (BID) | ORAL | 1 refills | Status: DC

## 2021-05-19 NOTE — Assessment & Plan Note (Signed)
Continues.  Will re-refer to ophthalmology

## 2021-05-19 NOTE — Patient Instructions (Signed)
Good to see you today - Thank you for coming in  Things we discussed today:  Back Take Naprosyn twice a day as needed for pain  Do back exercises at least twice a day  I will send you a letter about your blood tests

## 2021-05-19 NOTE — Assessment & Plan Note (Signed)
Will recheck her cbc

## 2021-05-19 NOTE — Assessment & Plan Note (Signed)
>>  ASSESSMENT AND PLAN FOR BLURRED VISION WRITTEN ON 05/19/2021  1:42 PM BY CHAMBLISS, MARSHALL L, MD  Continues.  Will re-refer to ophthalmology

## 2021-05-19 NOTE — Progress Notes (Signed)
° ° °  SUBJECTIVE:   CHIEF COMPLAINT / HPI:   Back Pain Has had on and off but worse since had epidural in May for her delivery.  Comes and goes.  Has not been taking any medication.  Sometimes has to sit and rest when hurting a lot.  No incontinence or lower extremity weakness.  No trauma  Vision Changes Was referred to ophthalmology in past but missed appointment  Anemia Was anemic during pregnancy.  Would like to be rechecked.  No lightheadness or syncope.  Having regular menstrual periods   PERTINENT  PMH / PSH: anemia of pregnancy, situational stress in April 22  OBJECTIVE:   BP 98/64    Pulse 78    Wt 153 lb 6.4 oz (69.6 kg)    SpO2 97%    BMI 24.76 kg/m     Back - Normal skin, Spine with normal alignment and no deformity.  mild tenderness to lower lumbar area bilaterally   Paraspinous muscles are not tender and without spasm.   Range of motion is full at neck and lumbar sacral regions. Able to touch toes  Neurologic exam : Able to walk on heels and toes.   Balance normal  SLR negative bilaterally  Heart - Regular rate and rhythm.  No murmurs, gallops or rubs.    Lungs:  Normal respiratory effort, chest expands symmetrically. Lungs are clear to auscultation, no crackles or wheezes. No edema     ASSESSMENT/PLAN:   Anemia in pregnancy Will recheck her cbc   Back pain Seems musculoskeletal.  No red flags.  Suggest ROM exercises and as needed naprosyn   Blurred vision Continues.  Will re-refer to ophthalmology      Carney Living, MD [facility]

## 2021-05-19 NOTE — Assessment & Plan Note (Signed)
Seems musculoskeletal.  No red flags.  Suggest ROM exercises and as needed naprosyn

## 2021-05-20 ENCOUNTER — Encounter: Payer: Self-pay | Admitting: Family Medicine

## 2021-05-20 LAB — CBC
Hematocrit: 36.8 % (ref 34.0–46.6)
Hemoglobin: 12.5 g/dL (ref 11.1–15.9)
MCH: 28.5 pg (ref 26.6–33.0)
MCHC: 34 g/dL (ref 31.5–35.7)
MCV: 84 fL (ref 79–97)
Platelets: 279 10*3/uL (ref 150–450)
RBC: 4.38 x10E6/uL (ref 3.77–5.28)
RDW: 11.9 % (ref 11.7–15.4)
WBC: 7 10*3/uL (ref 3.4–10.8)

## 2022-03-29 ENCOUNTER — Encounter: Payer: Self-pay | Admitting: Family Medicine

## 2022-03-29 ENCOUNTER — Ambulatory Visit: Payer: Medicaid Other | Admitting: Family Medicine

## 2022-03-29 VITALS — BP 103/78 | HR 74 | Ht 66.0 in | Wt 160.1 lb

## 2022-03-29 DIAGNOSIS — G8929 Other chronic pain: Secondary | ICD-10-CM | POA: Diagnosis not present

## 2022-03-29 DIAGNOSIS — R109 Unspecified abdominal pain: Secondary | ICD-10-CM

## 2022-03-29 DIAGNOSIS — M545 Low back pain, unspecified: Secondary | ICD-10-CM | POA: Diagnosis not present

## 2022-03-29 DIAGNOSIS — K5901 Slow transit constipation: Secondary | ICD-10-CM

## 2022-03-29 MED ORDER — POLYETHYLENE GLYCOL 3350 17 GM/SCOOP PO POWD: 17.0000 g | Freq: Once | ORAL | 0 refills | Status: AC

## 2022-03-29 NOTE — Assessment & Plan Note (Signed)
Chronic, nonspecific bilateral lower quadrant pain most likely due to constipation or possible IBS.  Possibly functional abdominal pain, patient denies current life stressors.  Exam reassuring. -Consider IBD evaluation at next visit -1 cap of MiraLAX daily for regular daily bowel movements -Follow-up as needed

## 2022-03-29 NOTE — Progress Notes (Cosign Needed Addendum)
    SUBJECTIVE:   CHIEF COMPLAINT / HPI:   Stomach - Chronic crampy stomach pain since pregnancy 1 and half years ago.  Not associated with bowel movements, not associated with eating.  Intermittent.  Endorse having bowel movements once a day to once every 3 days.  Does have hard bowel movements.  Sometimes feels like she has to have a bowel movement and strains but is unable to.  Denies nausea or vomiting.  States her sister has IBS.  Back - Chronic back pain since pregnancy 1-1/2 years ago.  Describes it as sharp sometimes radiating to left and right hip area.  Endorses some numbness and tingling in fingers and toes.  Does endorse unintentional weight loss, very rare chills at night.  No loss of bowel or bladder control.  HPV vaccines and Flu?  PERTINENT  PMH / PSH: Back pain  OBJECTIVE:   BP 103/78   Pulse 74   Ht 5\' 6"  (1.676 m)   Wt 160 lb 2 oz (72.6 kg)   LMP 03/15/2022   SpO2 98%   BMI 25.84 kg/m   General: NAD  Respiratory: normal WOB on RA Abdomen: soft, mild bilateral lower quadrant tenderness, no rebound or guarding Back: No obvious deformities, erythema, nontender to palpation along vertebral spine, nontender to palpation lower lumbar paraspinal areas and upper paraspinal muscles rhomboids and trapezius, full range of motion in lateral flexion and rotation, forward flexion and extension, negative bilateral straight leg raise Extremities: Moving all 4 extremities equally   ASSESSMENT/PLAN:   Back pain No red flag symptoms, exam reassuring.  Most likely musculoskeletal strain/chronic imbalance due to childcare.  No evidence of neuropathic causes, or malignancy. -Physical therapy referral goal of home exercise regimen -Instructed use of NSAIDs and Tylenol as needed for limiting pain  Stomach pain Chronic, nonspecific bilateral lower quadrant pain most likely due to constipation or possible IBS.  Possibly functional abdominal pain, patient denies current life stressors.   Exam reassuring. -Consider IBD evaluation at next visit -1 cap of MiraLAX daily for regular daily bowel movements -Follow-up as needed     Salvadore Oxford, MD [place]

## 2022-03-29 NOTE — Assessment & Plan Note (Signed)
No red flag symptoms, exam reassuring.  Most likely musculoskeletal strain/chronic imbalance due to childcare.  No evidence of neuropathic causes, or malignancy. -Physical therapy referral goal of home exercise regimen -Instructed use of NSAIDs and Tylenol as needed for limiting pain

## 2022-03-29 NOTE — Patient Instructions (Signed)
It was great to see you! Thank you for allowing me to participate in your care!  I recommend that you always bring your medications to each appointment as this makes it easy to ensure we are on the correct medications and helps [place] not miss when refills are needed.  Our plans for today:  -I made a referral to physical therapy to work on home exercise regimen.  Please follow-up as needed.  Please use ibuprofen, Tylenol as needed for pain. -Please take 1 cap of MiraLAX daily until your bowel movements are regular once a day.     Take care and seek immediate care sooner if you develop any concerns.   Dr. Salvadore Oxford, MD [place]

## 2022-04-07 NOTE — Therapy (Signed)
OUTPATIENT PHYSICAL THERAPY THORACOLUMBAR EVALUATION   Patient Name: Jacqueline Miranda MRN: [id number] DOB:08/02/2000, 21 y.o., female Today's Date: 04/08/2022   PT End of Session - 04/08/22 0834     Visit Number 1    Number of Visits 7    Date for PT Re-Evaluation 05/22/22    Authorization Type MCD- [employer] requesting auth    PT Start Time 0845    PT Stop Time 0923    PT Time Calculation (min) 38 min    Activity Tolerance Patient tolerated treatment well    Behavior During Therapy [facility] for tasks assessed/performed             Past Medical History:  Diagnosis Date   Anemia    Past Surgical History:  Procedure Laterality Date   TONSILLECTOMY     Patient Active Problem List   Diagnosis Date Noted   Stomach pain 03/29/2022   Back pain 05/19/2021   Acute reaction to situational stress 09/04/2020   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   Anemia in pregnancy 06/28/2020   Toothache 04/21/2020   Leg pain, bilateral 04/21/2020   Blurred vision 04/21/2020    PCP: Evelena Leyden, DO   REFERRING PROVIDER: Moses Manners, MD   REFERRING DIAG: M54.50,G89.29 (ICD-10-CM) - Chronic bilateral low back pain without sciatica   Rationale for Evaluation and Treatment: Rehabilitation  THERAPY DIAG:  Other low back pain  Pain in thoracic spine  Muscle weakness (generalized)  Abnormal posture  ONSET DATE: chronic  SUBJECTIVE:                                                                                                                                                                                          In-person interpreter  SUBJECTIVE STATEMENT: Patient reports that her back pain started during pregnancy, but has worsened since vaginal delivery of her child on 09/29/20. She denies any previous back pain or injury prior to pregnancy. She reports her back pain courses from the upper thoracic to lower lumbar region. She reports occasional numbness/tingling that began while she was  pregnant that can course from the posterolateral BLE (Rt>Lt) all the way to the toes. She reports no complications with pregnancy and delivery. She reports difficulty with carrying, prolonged sitting, and household activities secondary to pain. No red flag symptoms.   PERTINENT HISTORY:  None   PAIN:  Are you having pain? Yes: NPRS scale: 8/10 Pain location: mid/lower back Pain description: shocking Aggravating factors: carrying,lifting, sitting Relieving factors: unknown  PRECAUTIONS: None  WEIGHT BEARING RESTRICTIONS: No  FALLS:  Has patient fallen in last 6 months? No  LIVING ENVIRONMENT: Lives with: lives  with their family Lives in: House/apartment Stairs: No Has following equipment at home: None  OCCUPATION: student   PLOF: Independent  PATIENT GOALS: "pain free"     OBJECTIVE:   DIAGNOSTIC FINDINGS:  None   PATIENT SURVEYS:  N/A language   SCREENING FOR RED FLAGS: Bowel or bladder incontinence: No Spinal tumors: No Cauda equina syndrome: No Compression fracture: No Abdominal aneurysm: No  COGNITION: Overall cognitive status: Within functional limits for tasks assessed     SENSATION: WFL  MUSCLE LENGTH: Hamstrings: WNL Thomas test: (+) Hip flexor bilaterally   POSTURE: anterior pelvic tilt  PALPATION: TTP bilateral thoracolumbar paraspinals   LUMBAR ROM:   AROM eval  Flexion WNL LBP  Extension WNL LBP  Right lateral flexion WNL  Left lateral flexion WNL  Right rotation WNL  Left rotation WNL   (Blank rows = not tested)  LOWER EXTREMITY ROM:     Active  Right eval Left eval  Hip flexion    Hip extension    Hip abduction    Hip adduction    Hip internal rotation    Hip external rotation    Knee flexion    Knee extension    Ankle dorsiflexion    Ankle plantarflexion    Ankle inversion    Ankle eversion     (Blank rows = not tested)  LOWER EXTREMITY MMT:    MMT Right eval Left eval  Hip flexion 4- 4-  Hip extension 3+  3+  Hip abduction 3+ 3+  Hip adduction    Hip internal rotation    Hip external rotation    Knee flexion 5 5  Knee extension 5 5  Ankle dorsiflexion 5 5  Ankle plantarflexion 5 5  Ankle inversion    Core 2   (Blank rows = not tested)  LUMBAR SPECIAL TESTS:  (-) SLR   FUNCTIONAL TESTS:  Plank: unable  Squat: excessive trunk flexion, limited hip/knee flexion.   GAIT: Distance walked: 10 ft  Assistive device utilized: None Level of assistance: Complete Independence Comments: WNL  [facility] Adult PT Treatment:                                                DATE: 04/08/22 Therapeutic Exercise: Demonstrated and issue initial HEP.   Therapeutic Activity: Education on assessment findings that will be addressed throughout duration of POC.      PATIENT EDUCATION:  Education details: see treatment  Person educated: Patient Education method: Explanation, Demonstration, Tactile cues, Verbal cues, and Handouts Education comprehension: verbalized understanding, returned demonstration, verbal cues required, tactile cues required, and needs further education  HOME EXERCISE PROGRAM: Access Code: [email or web address] URL: [email or web address] Date: 04/08/2022 Prepared by: Letitia Libra  Exercises - Supine Bridge  - 2 x daily - 7 x weekly - 2 sets - 10 reps - Sidelying Hip Abduction  - 2 x daily - 7 x weekly - 2 sets - 10 reps - Supine Active Straight Leg Raise  - 2 x daily - 7 x weekly - 2 sets - 10 reps - Supine Posterior Pelvic Tilt  - 2 x daily - 7 x weekly - 2 sets - 10 reps  ASSESSMENT:  CLINICAL IMPRESSION: Patient is a 21 y.o. female who was seen today for physical therapy evaluation and treatment for chronic back pain that has been ongoing since pregnancy that has  worsened post-partum. She has good lumbar AROM, endorsing pain with flexion and extension AROM. She has significant core and hip weakness , aberrant lifting mechanics, and postural abnormalities. She will benefit  from skilled PT to address the above stated deficits in order to optimize function and assist in overall pain reduction.     OBJECTIVE IMPAIRMENTS: decreased activity tolerance, decreased endurance, decreased knowledge of condition, difficulty walking, decreased strength, increased fascial restrictions, impaired flexibility, improper body mechanics, postural dysfunction, and pain.   ACTIVITY LIMITATIONS: carrying, lifting, bending, sitting, standing, squatting, and locomotion level  PARTICIPATION LIMITATIONS: meal prep, cleaning, laundry, shopping, and school  PERSONAL FACTORS: Age, Fitness, Profession, and Time since onset of injury/illness/exacerbation are also affecting patient's functional outcome.   REHAB POTENTIAL: Good  CLINICAL DECISION MAKING: Stable/uncomplicated  EVALUATION COMPLEXITY: Low   GOALS: Goals reviewed with patient? Yes  SHORT TERM GOALS: Target date: 04/29/22  Patient will be independent and compliant with initial HEP.   Baseline: issued at eval  Goal status: INITIAL  2.  Patient will maintain plank on elbows with proper form for at least 10 seconds to signify improvements in lumbopelvic stability.  Baseline: unable  Goal status: INITIAL  3.  Patient will demonstrate proper bending/lifting mechanics to reduce stress on her back with lifting her daughter.  Baseline: aberrant mechanics  Goal status: INITIAL   LONG TERM GOALS: Target date: 05/20/22  Patient will demonstrate at least 4+/5 bilateral hip strength to improve stability about the chain with standing activity.  Baseline: see above  Goal status: INITIAL  2.  Patient will be able to lift at least 20 lbs with proper form without an increase in back pain.  Baseline: aberrant mechanics, increased pain  Goal status: INITIAL  3.  Patient will report pain as </= 4/10 to reduce her current functional limitations.  Baseline: see above  Goal status: INITIAL  PLAN:  PT FREQUENCY: 1x/week  PT  DURATION: 6 weeks  PLANNED INTERVENTIONS: Therapeutic exercises, Therapeutic activity, Neuromuscular re-education, Balance training, Patient/Family education, Self Care, Dry Needling, Cryotherapy, Moist heat, Manual therapy, and Re-evaluation.  PLAN FOR NEXT SESSION: review and progress HEP prn; core and hip strengthening   Letitia Libra, PT, DPT, ATC 04/08/22 2:12 PM  Check all possible CPT codes: 78295 - PT Re-evaluation, 97110- Therapeutic Exercise, [phone]- Neuro Re-education, 97140 - Manual Therapy, 97530 - Therapeutic Activities, and 97535 - Self Care    Check all conditions that are expected to impact treatment: None of these apply   If treatment provided at initial evaluation, no treatment charged due to lack of authorization.

## 2022-04-08 ENCOUNTER — Other Ambulatory Visit: Payer: Self-pay

## 2022-04-08 ENCOUNTER — Ambulatory Visit: Payer: Medicaid Other | Attending: Family Medicine

## 2022-04-08 DIAGNOSIS — R293 Abnormal posture: Secondary | ICD-10-CM | POA: Diagnosis not present

## 2022-04-08 DIAGNOSIS — G8929 Other chronic pain: Secondary | ICD-10-CM | POA: Diagnosis not present

## 2022-04-08 DIAGNOSIS — M6281 Muscle weakness (generalized): Secondary | ICD-10-CM | POA: Insufficient documentation

## 2022-04-08 DIAGNOSIS — M5459 Other low back pain: Secondary | ICD-10-CM | POA: Insufficient documentation

## 2022-04-08 DIAGNOSIS — M545 Low back pain, unspecified: Secondary | ICD-10-CM | POA: Diagnosis not present

## 2022-04-08 DIAGNOSIS — M546 Pain in thoracic spine: Secondary | ICD-10-CM | POA: Diagnosis not present

## 2022-04-16 ENCOUNTER — Ambulatory Visit: Payer: Medicaid Other | Admitting: Physical Therapy

## 2022-04-16 ENCOUNTER — Encounter: Payer: Self-pay | Admitting: Physical Therapy

## 2022-04-16 DIAGNOSIS — R293 Abnormal posture: Secondary | ICD-10-CM

## 2022-04-16 DIAGNOSIS — M5459 Other low back pain: Secondary | ICD-10-CM | POA: Diagnosis not present

## 2022-04-16 DIAGNOSIS — G8929 Other chronic pain: Secondary | ICD-10-CM | POA: Diagnosis not present

## 2022-04-16 DIAGNOSIS — M6281 Muscle weakness (generalized): Secondary | ICD-10-CM

## 2022-04-16 DIAGNOSIS — M545 Low back pain, unspecified: Secondary | ICD-10-CM | POA: Diagnosis not present

## 2022-04-16 DIAGNOSIS — M546 Pain in thoracic spine: Secondary | ICD-10-CM | POA: Diagnosis not present

## 2022-04-16 NOTE — Therapy (Signed)
OUTPATIENT PHYSICAL THERAPY TREATMENT NOTE   Patient Name: Jacqueline Miranda MRN: [id number] DOB:[birth year], 21 y.o., female Today's Date: 04/16/2022  PCP: Evelena Leyden, DO     REFERRING PROVIDER: Moses Manners, MD   END OF SESSION:   PT End of Session - 04/16/22 0855     Visit Number 2    Number of Visits 7    Date for PT Re-Evaluation 05/22/22    Authorization Type MCD- [employer] requesting auth    PT Start Time 0850    PT Stop Time 0915    PT Time Calculation (min) 25 min             Past Medical History:  Diagnosis Date   Anemia    Past Surgical History:  Procedure Laterality Date   TONSILLECTOMY     Patient Active Problem List   Diagnosis Date Noted   Stomach pain 03/29/2022   Back pain 05/19/2021   Acute reaction to situational stress 09/04/2020   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   Anemia in pregnancy 06/28/2020   Toothache 04/21/2020   Leg pain, bilateral 04/21/2020   Blurred vision 04/21/2020    REFERRING DIAG: M54.50,G89.29 (ICD-10-CM) - Chronic bilateral low back pain without sciatica   THERAPY DIAG:  Other low back pain  Pain in thoracic spine  Muscle weakness (generalized)  Abnormal posture  Rationale for Evaluation and Treatment rehabilitation  PERTINENT HISTORY: None  PRECAUTIONS: None  SUBJECTIVE:                                                                                                                                                                                      SUBJECTIVE STATEMENT:  My pain is a 5/10. I have been doing the exercises.   PAIN:  Are you having pain? Yes: NPRS scale: 5/10 Pain location: mid/lower back Pain description: shocking Aggravating factors: carrying,lifting, sitting Relieving factors: unknown   OBJECTIVE: (objective measures completed at initial evaluation unless otherwise dated)   DIAGNOSTIC FINDINGS:  None    PATIENT SURVEYS:  N/A language    SCREENING FOR RED FLAGS: Bowel or  bladder incontinence: No Spinal tumors: No Cauda equina syndrome: No Compression fracture: No Abdominal aneurysm: No   COGNITION: Overall cognitive status: Within functional limits for tasks assessed                          SENSATION: WFL   MUSCLE LENGTH: Hamstrings: WNL Thomas test: (+) Hip flexor bilaterally    POSTURE: anterior pelvic tilt   PALPATION: TTP bilateral thoracolumbar paraspinals    LUMBAR ROM:    AROM eval  Flexion WNL LBP  Extension WNL LBP  Right lateral flexion WNL  Left lateral flexion WNL  Right rotation WNL  Left rotation WNL   (Blank rows = not tested)   LOWER EXTREMITY ROM:      Active  Right eval Left eval  Hip flexion      Hip extension      Hip abduction      Hip adduction      Hip internal rotation      Hip external rotation      Knee flexion      Knee extension      Ankle dorsiflexion      Ankle plantarflexion      Ankle inversion      Ankle eversion       (Blank rows = not tested)   LOWER EXTREMITY MMT:     MMT Right eval Left eval  Hip flexion 4- 4-  Hip extension 3+ 3+  Hip abduction 3+ 3+  Hip adduction      Hip internal rotation      Hip external rotation      Knee flexion 5 5  Knee extension 5 5  Ankle dorsiflexion 5 5  Ankle plantarflexion 5 5  Ankle inversion      Core 2   (Blank rows = not tested)   LUMBAR SPECIAL TESTS:  (-) SLR    FUNCTIONAL TESTS:  Plank: unable  Squat: excessive trunk flexion, limited hip/knee flexion.    GAIT: Distance walked: 10 ft  Assistive device utilized: None Level of assistance: Complete Independence Comments: WNL   [facility] Adult PT Treatment:                                                DATE: 04/16/22 Therapeutic Exercise: Bridge SLR 10 x 2 PPT 10 x 2 5 sec hold  Side hip abduction 10 x 2  90/90 ab bracing 5 sec x 5  Bird Dog 5 sec x 5    [facility] Adult PT Treatment:                                                DATE: 04/08/22 Therapeutic  Exercise: Demonstrated and issue initial HEP.    Therapeutic Activity: Education on assessment findings that will be addressed throughout duration of POC.          PATIENT EDUCATION:  Education details: see treatment  Person educated: Patient Education method: Explanation, Demonstration, Tactile cues, Verbal cues, and Handouts Education comprehension: verbalized understanding, returned demonstration, verbal cues required, tactile cues required, and needs further education   HOME EXERCISE PROGRAM: Access Code: [email or web address] URL: [email or web address] Date: 04/08/2022 Prepared by: Letitia Libra   Exercises - Supine Bridge  - 2 x daily - 7 x weekly - 2 sets - 10 reps - Sidelying Hip Abduction  - 2 x daily - 7 x weekly - 2 sets - 10 reps - Supine Active Straight Leg Raise  - 2 x daily - 7 x weekly - 2 sets - 10 reps - Supine Posterior Pelvic Tilt  - 2 x daily - 7 x weekly - 2 sets - 10 reps   ASSESSMENT:   CLINICAL IMPRESSION: Patient is a 21 y.o. female who was  seen today for physical therapy treatment for chronic back pain that has been ongoing since pregnancy that has worsened post-partum. Reviewed HEP and progressed with lumbar stabilization. Updated HEP. Session time limited due to small child accompanying patient today.  She will continue to benefit from skilled PT to address the above stated deficits in order to optimize function and assist in overall pain reduction.      OBJECTIVE IMPAIRMENTS: decreased activity tolerance, decreased endurance, decreased knowledge of condition, difficulty walking, decreased strength, increased fascial restrictions, impaired flexibility, improper body mechanics, postural dysfunction, and pain.    ACTIVITY LIMITATIONS: carrying, lifting, bending, sitting, standing, squatting, and locomotion level   PARTICIPATION LIMITATIONS: meal prep, cleaning, laundry, shopping, and school   PERSONAL FACTORS: Age, Fitness, Profession, and Time since  onset of injury/illness/exacerbation are also affecting patient's functional outcome.    REHAB POTENTIAL: Good   CLINICAL DECISION MAKING: Stable/uncomplicated   EVALUATION COMPLEXITY: Low     GOALS: Goals reviewed with patient? Yes   SHORT TERM GOALS: Target date: 04/29/22   Patient will be independent and compliant with initial HEP.    Baseline: issued at eval  Goal status: INITIAL   2.  Patient will maintain plank on elbows with proper form for at least 10 seconds to signify improvements in lumbopelvic stability.  Baseline: unable  Goal status: INITIAL   3.  Patient will demonstrate proper bending/lifting mechanics to reduce stress on her back with lifting her daughter.  Baseline: aberrant mechanics  Goal status: INITIAL     LONG TERM GOALS: Target date: 05/20/22   Patient will demonstrate at least 4+/5 bilateral hip strength to improve stability about the chain with standing activity.  Baseline: see above  Goal status: INITIAL   2.  Patient will be able to lift at least 20 lbs with proper form without an increase in back pain.  Baseline: aberrant mechanics, increased pain  Goal status: INITIAL   3.  Patient will report pain as </= 4/10 to reduce her current functional limitations.  Baseline: see above  Goal status: INITIAL   PLAN:   PT FREQUENCY: 1x/week   PT DURATION: 6 weeks   PLANNED INTERVENTIONS: Therapeutic exercises, Therapeutic activity, Neuromuscular re-education, Balance training, Patient/Family education, Self Care, Dry Needling, Cryotherapy, Moist heat, Manual therapy, and Re-evaluation.   PLAN FOR NEXT SESSION: review and progress HEP prn; core and hip strengthening      Jannette Spanner, PTA 04/16/22 9:25 AM Phone: [phone] Fax: [phone]

## 2022-04-30 ENCOUNTER — Encounter: Payer: Self-pay | Admitting: Physical Therapy

## 2022-04-30 ENCOUNTER — Ambulatory Visit: Payer: Medicaid Other | Attending: Family Medicine | Admitting: Physical Therapy

## 2022-04-30 DIAGNOSIS — M6281 Muscle weakness (generalized): Secondary | ICD-10-CM

## 2022-04-30 DIAGNOSIS — R293 Abnormal posture: Secondary | ICD-10-CM

## 2022-04-30 DIAGNOSIS — M546 Pain in thoracic spine: Secondary | ICD-10-CM

## 2022-04-30 DIAGNOSIS — M5459 Other low back pain: Secondary | ICD-10-CM

## 2022-04-30 NOTE — Therapy (Signed)
OUTPATIENT PHYSICAL THERAPY TREATMENT NOTE   Patient Name: Jacqueline Miranda MRN: [id number] DOB:[birth year], 21 y.o., female Today's Date: 04/30/2022  PCP: Rise Patience, DO     REFERRING PROVIDER: Zenia Resides, MD   END OF SESSION:   PT End of Session - 04/30/22 0847     Visit Number 3    Number of Visits 7    Date for PT Re-Evaluation 05/22/22    Authorization Type M[employer]- [employer] requesting auth    Authorization Time Period 04/08/22-05/07/22; 4 visits    Authorization - Visit Number 2    Authorization - Number of Visits 4    PT Start Time 8841    PT Stop Time 0928    PT Time Calculation (min) 41 min             Past Medical History:  Diagnosis Date   Anemia    Past Surgical History:  Procedure Laterality Date   TONSILLECTOMY     Patient Active Problem List   Diagnosis Date Noted   Stomach pain 03/29/2022   Back pain 05/19/2021   Acute reaction to situational stress 09/04/2020   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   Anemia in pregnancy 06/28/2020   Toothache 04/21/2020   Leg pain, bilateral 04/21/2020   Blurred vision 04/21/2020    REFERRING DIAG: M54.50,G89.29 (ICD-10-CM) - Chronic bilateral low back pain without sciatica   THERAPY DIAG:  Other low back pain  Pain in thoracic spine  Muscle weakness (generalized)  Abnormal posture  Rationale for Evaluation and Treatment rehabilitation  PERTINENT HISTORY: None  PRECAUTIONS: None  SUBJECTIVE:                                                                                                                                                                                      SUBJECTIVE STATEMENT:  I am having upper back and neck pain today 5/10. I do not have any lower back pain right now.  (Via video interpreter)    PAIN:  Are you having pain? Yes: NPRS scale: 5/10 Pain location: upper back and neck Pain description: shocking Aggravating factors: carrying,lifting, sitting Relieving factors:  unknown   OBJECTIVE: (objective measures completed at initial evaluation unless otherwise dated)   DIAGNOSTIC FINDINGS:  None    PATIENT SURVEYS:  N/A language    SCREENING FOR RED FLAGS: Bowel or bladder incontinence: No Spinal tumors: No Cauda equina syndrome: No Compression fracture: No Abdominal aneurysm: No   COGNITION: Overall cognitive status: Within functional limits for tasks assessed  SENSATION: WFL   MUSCLE LENGTH: Hamstrings: WNL Thomas test: (+) Hip flexor bilaterally    POSTURE: anterior pelvic tilt   PALPATION: TTP bilateral thoracolumbar paraspinals    LUMBAR ROM:    AROM eval  Flexion WNL LBP  Extension WNL LBP  Right lateral flexion WNL  Left lateral flexion WNL  Right rotation WNL  Left rotation WNL   (Blank rows = not tested)   LOWER EXTREMITY ROM:      Active  Right eval Left eval  Hip flexion      Hip extension      Hip abduction      Hip adduction      Hip internal rotation      Hip external rotation      Knee flexion      Knee extension      Ankle dorsiflexion      Ankle plantarflexion      Ankle inversion      Ankle eversion       (Blank rows = not tested)   LOWER EXTREMITY MMT:     MMT Right eval Left eval  Hip flexion 4- 4-  Hip extension 3+ 3+  Hip abduction 3+ 3+  Hip adduction      Hip internal rotation      Hip external rotation      Knee flexion 5 5  Knee extension 5 5  Ankle dorsiflexion 5 5  Ankle plantarflexion 5 5  Ankle inversion      Core 2   (Blank rows = not tested)   LUMBAR SPECIAL TESTS:  (-) SLR    FUNCTIONAL TESTS:  Plank: unable : 04/30/22: able to hold 10 sec Squat: excessive trunk flexion, limited hip/knee flexion.    GAIT: Distance walked: 10 ft  Assistive device utilized: None Level of assistance: Complete Independence Comments: WNL   [facility] Adult PT Treatment:                                                DATE: 04/30/22 Therapeutic Exercise: STS x 10   Plank on elbows 5 sec, 10 sec , 10 sec  Open books with hand on chest due to spacing in clinic room Bird dogs 5 sec x 10- verbal cues for correct form Side hip abduction- tactile cues for positioning , 10 x 2 each  90/90 10 sec holds  x 5 SLR with Ab brace x 10 each  Bridge 5 sec x 15   [facility] Adult PT Treatment:                                                DATE: 04/16/22 Therapeutic Exercise: Bridge SLR 10 x 2 PPT 10 x 2 5 sec hold  Side hip abduction 10 x 2  90/90 ab bracing 5 sec x 5  Bird Dog 5 sec x 5    [facility] Adult PT Treatment:                                                DATE: 04/08/22 Therapeutic Exercise: Demonstrated and issue  initial HEP.    Therapeutic Activity: Education on assessment findings that will be addressed throughout duration of POC.          PATIENT EDUCATION:  Education details: see treatment  Person educated: Patient Education method: Explanation, Demonstration, Tactile cues, Verbal cues, and Handouts Education comprehension: verbalized understanding, returned demonstration, verbal cues required, tactile cues required, and needs further education   HOME EXERCISE PROGRAM: Access Code: [email or web address] URL: [email or web address] Date: 04/08/2022 Prepared by: Gwendolyn Grant   Exercises - Supine Bridge  - 2 x daily - 7 x weekly - 2 sets - 10 reps - Sidelying Hip Abduction  - 2 x daily - 7 x weekly - 2 sets - 10 reps - Supine Active Straight Leg Raise  - 2 x daily - 7 x weekly - 2 sets - 10 reps - Supine Posterior Pelvic Tilt  - 2 x daily - 7 x weekly - 2 sets - 10 reps Added 04/16/22 - Supine 90/90 Abdominal Bracing  - 2 x daily - 7 x weekly - 1 sets - 10 reps - 5 hold - Bird Dog  - 2 x daily - 7 x weekly - 1 sets - 10 reps - 5 hold Added 04/30/22 - Standard Plank  - 1 x daily - 7 x weekly - 1 sets - 5 reps - 10-20 hold - Sit to stand with control  - 1 x daily - 7 x weekly - 2 sets - 10 reps   ASSESSMENT:   CLINICAL  IMPRESSION: Patient is a 21 y.o. female who was seen today for physical therapy treatment for chronic back pain that has been ongoing since pregnancy that has worsened post-partum. Today she reports upper back and neck pain, no low back pain. She does report intermittent low back pain continues and she had this pain yesterday evening. Overall, she reports no change in her pain thus far. She reports non- compliance with HEP this week due to exams in school.  Advanced core strength to include Plank on elbows today and began STS to initiate lifting progression. She was able to hold forearm plank 10 sec , STG# 2 met. She c/o upper back pain during plank  and bilateral knee pain with STS. She had an increase in back pain at end of session and a decrease to 0/10 pain in upper back/neck.    She will continue to benefit from skilled PT to address the above stated deficits in order to optimize function and assist in overall pain reduction.      OBJECTIVE IMPAIRMENTS: decreased activity tolerance, decreased endurance, decreased knowledge of condition, difficulty walking, decreased strength, increased fascial restrictions, impaired flexibility, improper body mechanics, postural dysfunction, and pain.    ACTIVITY LIMITATIONS: carrying, lifting, bending, sitting, standing, squatting, and locomotion level   PARTICIPATION LIMITATIONS: meal prep, cleaning, laundry, shopping, and school   PERSONAL FACTORS: Age, Fitness, Profession, and Time since onset of injury/illness/exacerbation are also affecting patient's functional outcome.    REHAB POTENTIAL: Good   CLINICAL DECISION MAKING: Stable/uncomplicated   EVALUATION COMPLEXITY: Low     GOALS: Goals reviewed with patient? Yes   SHORT TERM GOALS: Target date: 04/29/22   Patient will be independent and compliant with initial HEP.    Baseline: issued at eval  Goal status: GOAL MET    2.  Patient will maintain plank on elbows with proper form for at least 10  seconds to signify improvements in lumbopelvic stability.  Baseline: unable  Status: can hold 10 sec 04/30/22  Goal status: GOAL MET   3.  Patient will demonstrate proper bending/lifting mechanics to reduce stress on her back with lifting her daughter.  Baseline: aberrant mechanics  Goal status: INITIAL     LONG TERM GOALS: Target date: 05/20/22   Patient will demonstrate at least 4+/5 bilateral hip strength to improve stability about the chain with standing activity.  Baseline: see above  Goal status: INITIAL   2.  Patient will be able to lift at least 20 lbs with proper form without an increase in back pain.  Baseline: aberrant mechanics, increased pain  Goal status: INITIAL   3.  Patient will report pain as </= 4/10 to reduce her current functional limitations.  Baseline: see above  Goal status: INITIAL   PLAN:   PT FREQUENCY: 1x/week   PT DURATION: 6 weeks   PLANNED INTERVENTIONS: Therapeutic exercises, Therapeutic activity, Neuromuscular re-education, Balance training, Patient/Family education, Self Care, Dry Needling, Cryotherapy, Moist heat, Manual therapy, and Re-evaluation.   PLAN FOR NEXT SESSION: review and progress HEP prn; core and hip strengthening      Hessie Diener, PTA 04/30/22 10:04 AM Phone: [phone] Fax: [phone]

## 2022-05-07 ENCOUNTER — Ambulatory Visit: Payer: Medicaid Other

## 2022-05-14 ENCOUNTER — Ambulatory Visit: Payer: Medicaid Other | Admitting: Physical Therapy

## 2022-05-14 ENCOUNTER — Encounter: Payer: Self-pay | Admitting: Physical Therapy

## 2022-05-14 DIAGNOSIS — R293 Abnormal posture: Secondary | ICD-10-CM

## 2022-05-14 DIAGNOSIS — M546 Pain in thoracic spine: Secondary | ICD-10-CM

## 2022-05-14 DIAGNOSIS — M6281 Muscle weakness (generalized): Secondary | ICD-10-CM

## 2022-05-14 DIAGNOSIS — M5459 Other low back pain: Secondary | ICD-10-CM

## 2022-05-14 NOTE — Therapy (Signed)
OUTPATIENT PHYSICAL THERAPY TREATMENT NOTE   Patient Name: Jacqueline Miranda MRN: [id number] DOB:2000-09-09, 21 y.o., female Today's Date: 05/14/2022  PCP: Rise Patience, DO     REFERRING PROVIDER: Zenia Resides, MD   END OF SESSION:   PT End of Session - 05/14/22 0934     Visit Number 3   no charge   Number of Visits 7    Date for PT Re-Evaluation 05/22/22    Authorization Type [employer]- [employer] - no charge    Authorization Time Period 04/08/22-05/07/22; 4 visits; used 2 visits    PT Start Time 0930    PT Stop Time 1005    PT Time Calculation (min) 35 min             Past Medical History:  Diagnosis Date   Anemia    Past Surgical History:  Procedure Laterality Date   TONSILLECTOMY     Patient Active Problem List   Diagnosis Date Noted   Stomach pain 03/29/2022   Back pain 05/19/2021   Acute reaction to situational stress 09/04/2020   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   Anemia in pregnancy 06/28/2020   Toothache 04/21/2020   Leg pain, bilateral 04/21/2020   Blurred vision 04/21/2020    REFERRING DIAG: M54.50,G89.29 (ICD-10-CM) - Chronic bilateral low back pain without sciatica   THERAPY DIAG:  Other low back pain  Pain in thoracic spine  Muscle weakness (generalized)  Abnormal posture  Rationale for Evaluation and Treatment rehabilitation  PERTINENT HISTORY: None  PRECAUTIONS: None  SUBJECTIVE:                                                                                                                                                                                      SUBJECTIVE STATEMENT: I am better this week. I am not sure if I am healing or the exercises are helping. I have a little bit of pain today, mid back, 5/10. No pain in neck and upper back today but it comes and goes every day.   PAIN:  Are you having pain? Yes: NPRS scale: 5/10 Pain location: mid back  Pain description: pressure Aggravating factors: carrying,lifting,  sitting Relieving factors: unknown   OBJECTIVE: (objective measures completed at initial evaluation unless otherwise dated)   DIAGNOSTIC FINDINGS:  None    PATIENT SURVEYS:  N/A language    SCREENING FOR RED FLAGS: Bowel or bladder incontinence: No Spinal tumors: No Cauda equina syndrome: No Compression fracture: No Abdominal aneurysm: No   COGNITION: Overall cognitive status: Within functional limits for tasks assessed  SENSATION: WFL   MUSCLE LENGTH: Hamstrings: WNL Thomas test: (+) Hip flexor bilaterally    POSTURE: anterior pelvic tilt   PALPATION: TTP bilateral thoracolumbar paraspinals    LUMBAR ROM:    AROM eval 05/14/22  Flexion WNL LBP WNL  Extension WNL LBP WNL LBP  Right lateral flexion WNL   Left lateral flexion WNL   Right rotation WNL   Left rotation WNL    (Blank rows = not tested)   LOWER EXTREMITY ROM:      Active  Right eval Left eval  Hip flexion      Hip extension      Hip abduction      Hip adduction      Hip internal rotation      Hip external rotation      Knee flexion      Knee extension      Ankle dorsiflexion      Ankle plantarflexion      Ankle inversion      Ankle eversion       (Blank rows = not tested)   LOWER EXTREMITY MMT:     MMT Right eval Left eval Right 05/14/22 Left 05/14/22  Hip flexion 4- 4- 4 4+  Hip extension 3+ 3+ 3+ 4-  Hip abduction 3+ 3+ 4 (gives after 2 sec) 4 (gives after 2 sec)  Hip adduction        Hip internal rotation        Hip external rotation        Knee flexion 5 5    Knee extension 5 5    Ankle dorsiflexion 5 5    Ankle plantarflexion 5 5    Ankle inversion        Core 2                                  (Blank rows = not tested)   LUMBAR SPECIAL TESTS:  (-) SLR    FUNCTIONAL TESTS:  Plank: unable : 04/30/22: able to hold 10 sec: 05/14/22: 25 sec Squat: excessive trunk flexion, limited hip/knee flexion.  05/14/22: Ab crunch: can get scapula off mat     GAIT: Distance walked: 10 ft  Assistive device utilized: None Level of assistance: Complete Independence Comments: WNL   [facility] Adult PT Treatment:                                                DATE: 05/14/22 No charge for the following:  Therapeutic Exercise: 90/90 brace  STS 10 # - cues for control  Plank on elbows and toes 25 sec   Therapeutic Activity: Squatting vs lifting with cues for hip hinge to pickup 5# from floor.  STS with 10# KB - cues for neutral spine     [facility] Adult PT Treatment:                                                DATE: 04/30/22 Therapeutic Exercise: STS x 10  Plank on elbows 5 sec, 10 sec , 10 sec  Open books with hand on chest due to spacing in clinic room [facility]  dogs 5 sec x 10- verbal cues for correct form Side hip abduction- tactile cues for positioning , 10 x 2 each  90/90 10 sec holds  x 5 SLR with Ab brace x 10 each  Bridge 5 sec x 15   [facility] Adult PT Treatment:                                                DATE: 04/16/22 Therapeutic Exercise: Bridge SLR 10 x 2 PPT 10 x 2 5 sec hold  Side hip abduction 10 x 2  90/90 ab bracing 5 sec x 5  Bird Dog 5 sec x 5    [facility] Adult PT Treatment:                                                DATE: 04/08/22 Therapeutic Exercise: Demonstrated and issue initial HEP.    Therapeutic Activity: Education on assessment findings that will be addressed throughout duration of POC.          PATIENT EDUCATION:  Education details: see treatment  Person educated: Patient Education method: Explanation, Demonstration, Tactile cues, Verbal cues, and Handouts Education comprehension: verbalized understanding, returned demonstration, verbal cues required, tactile cues required, and needs further education   HOME EXERCISE PROGRAM: Access Code: [email or web address] URL: [email or web address] Date: 04/08/2022 Prepared by: Gwendolyn Grant   Exercises - Supine Bridge  - 2 x daily - 7 x weekly - 2 sets -  10 reps - Sidelying Hip Abduction  - 2 x daily - 7 x weekly - 2 sets - 10 reps - Supine Active Straight Leg Raise  - 2 x daily - 7 x weekly - 2 sets - 10 reps - Supine Posterior Pelvic Tilt  - 2 x daily - 7 x weekly - 2 sets - 10 reps Added 04/16/22 - Supine 90/90 Abdominal Bracing  - 2 x daily - 7 x weekly - 1 sets - 10 reps - 5 hold - Bird Dog  - 2 x daily - 7 x weekly - 1 sets - 10 reps - 5 hold Added 04/30/22 - Standard Plank  - 1 x daily - 7 x weekly - 1 sets - 5 reps - 10-20 hold - Sit to stand with control  - 1 x daily - 7 x weekly - 2 sets - 10 reps   ASSESSMENT:   CLINICAL IMPRESSION: Patient is a 21 y.o. female who was seen today for physical therapy treatment for chronic back pain that has been ongoing since pregnancy that has worsened post-partum. Today she reports low back pain. She does experience mid or lower back pain every day however the intensity level is less overall. Today her pain is 5/10. Her MMT of hips has improved and her plank hold time has improved. She requires min cues for correct squatting and lifting mechanics however is improving.  She will continue to benefit from skilled PT to address the above stated deficits in order to optimize function and assist in overall pain reduction.      OBJECTIVE IMPAIRMENTS: decreased activity tolerance, decreased endurance, decreased knowledge of condition, difficulty walking, decreased strength, increased fascial restrictions, impaired flexibility, improper body mechanics, postural dysfunction, and pain.    ACTIVITY LIMITATIONS:  carrying, lifting, bending, sitting, standing, squatting, and locomotion level   PARTICIPATION LIMITATIONS: meal prep, cleaning, laundry, shopping, and school   PERSONAL FACTORS: Age, Fitness, Profession, and Time since onset of injury/illness/exacerbation are also affecting patient's functional outcome.    REHAB POTENTIAL: Good   CLINICAL DECISION MAKING: Stable/uncomplicated   EVALUATION  COMPLEXITY: Low     GOALS: Goals reviewed with patient? Yes   SHORT TERM GOALS: Target date: 04/29/22   Patient will be independent and compliant with initial HEP.    Baseline: issued at eval  Goal status: GOAL MET    2.  Patient will maintain plank on elbows with proper form for at least 10 seconds to signify improvements in lumbopelvic stability.  Baseline: unable  Status: can hold 10 sec 04/30/22 Goal status: GOAL MET   3.  Patient will demonstrate proper bending/lifting mechanics to reduce stress on her back with lifting her daughter.  Baseline: aberrant mechanics  Status: 05/14/22: improving with cues  Goal status: ONGOING     LONG TERM GOALS: Target date: 05/20/22   Patient will demonstrate at least 4+/5 bilateral hip strength to improve stability about the chain with standing activity.  Baseline: see above  Status: 05/14/22: improving-see MMT chart above Goal status: ONGOING   2.  Patient will be able to lift at least 20 lbs with proper form without an increase in back pain.  Baseline: aberrant mechanics, increased pain  Status: 05/14/22: min increased pain with lifting 5# from floor Goal status: ONGOING   3.  Patient will report pain as </= 4/10 to reduce her current functional limitations.  Baseline: see above  Status: 05/14/22: pain level 5/10 Goal status: ONGOING   PLAN:   PT FREQUENCY: 1x/week   PT DURATION: 6 weeks   PLANNED INTERVENTIONS: Therapeutic exercises, Therapeutic activity, Neuromuscular re-education, Balance training, Patient/Family education, Self Care, Dry Needling, Cryotherapy, Moist heat, Manual therapy, and Re-evaluation.   PLAN FOR NEXT SESSION: review and progress HEP prn; core and hip strengthening , re-eval     Hessie Diener, PTA 05/14/22 10:20 AM Phone: [phone] Fax: [phone]

## 2022-05-20 NOTE — Therapy (Signed)
OUTPATIENT PHYSICAL THERAPY TREATMENT NOTE/RE-CERTIFICATION   Patient Name: Jacqueline Miranda MRN: [id number] DOB:08/19/2000, 21 y.o., female Today's Date: 05/21/2022  PCP: Lilland, Alana, DO     REFERRING PROVIDER: Hensel, William A, MD   END OF SESSION:   PT End of Session - 05/21/22 0936     Visit Number 5    Number of Visits 11    Date for PT Re-Evaluation 07/03/22    Authorization Type M[employer]- [employer]- requesting additional auth    PT Start Time 0935    PT Stop Time 1015    PT Time Calculation (min) 40 min    Activity Tolerance Patient tolerated treatment well    Behavior During Therapy WFL for tasks assessed/performed              Past Medical History:  Diagnosis Date   Anemia    Past Surgical History:  Procedure Laterality Date   TONSILLECTOMY     Patient Active Problem List   Diagnosis Date Noted   Stomach pain 03/29/2022   Back pain 05/19/2021   Acute reaction to situational stress 09/04/2020   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   Anemia in pregnancy 06/28/2020   Toothache 04/21/2020   Leg pain, bilateral 04/21/2020   Blurred vision 04/21/2020    REFERRING DIAG: M54.50,G89.29 (ICD-10-CM) - Chronic bilateral low back pain without sciatica   THERAPY DIAG:  Other low back pain  Pain in thoracic spine  Muscle weakness (generalized)  Abnormal posture  Rationale for Evaluation and Treatment rehabilitation  PERTINENT HISTORY: None  PRECAUTIONS: None  SUBJECTIVE:                                                                                                                                                                                     Patient declined interpreter.  SUBJECTIVE STATEMENT: "Yesterday it was worse. I tried to do some exercise, but it didn't work." She reports no pain currently, but yesterday her pain was up to 9/10 along the low back.   PAIN:  Are you having pain? No    OBJECTIVE: (objective measures completed at initial evaluation  unless otherwise dated)   DIAGNOSTIC FINDINGS:  None    PATIENT SURVEYS:  N/A language    SCREENING FOR RED FLAGS: Bowel or bladder incontinence: No Spinal tumors: No Cauda equina syndrome: No Compression fracture: No Abdominal aneurysm: No   COGNITION: Overall cognitive status: Within functional limits for tasks assessed                          SENSATION: WFL   MUSCLE LENGTH: Hamstrings: WNL Thomas test: (+) Hip flexor bilaterally      POSTURE: anterior pelvic tilt   PALPATION: TTP bilateral thoracolumbar paraspinals    LUMBAR ROM:    AROM eval 05/14/22 05/21/22  Flexion WNL LBP WNL WNL LBP  Extension WNL LBP WNL LBP WNL LBP  Right lateral flexion WNL  WNL  Left lateral flexion WNL  WNL  Right rotation WNL  WNL  Left rotation WNL  WNL   (Blank rows = not tested)   LOWER EXTREMITY MMT:     MMT Right eval Left eval Right 05/14/22 Left 05/14/22 05/21/22   Hip flexion 4- 4- 4 4+ Bilateral 4/5   Hip extension 3+ 3+ 3+ 4- Bilateral 4/5   Hip abduction 3+ 3+ 4 (gives after 2 sec) 4 (gives after 2 sec) Bilateral 4-/5  Hip adduction         Hip internal rotation         Hip external rotation         Knee flexion 5 5     Knee extension 5 5     Ankle dorsiflexion 5 5     Ankle plantarflexion 5 5     Ankle inversion         Core 2                                3   (Blank rows = not tested)   LUMBAR SPECIAL TESTS:  (-) SLR    FUNCTIONAL TESTS:  Plank: unable : 04/30/22: able to hold 10 sec: 05/14/22: 25 sec Squat: excessive trunk flexion, limited hip/knee flexion.  05/14/22: Ab crunch: can get scapula off mat   05/21/22: 33 seconds plank; squat: excessive trunk flexion    GAIT: Distance walked: 10 ft  Assistive device utilized: None Level of assistance: Complete Independence Comments: WNL  [facility] Adult PT Treatment:                                                DATE: 05/21/22 Therapeutic Exercise: Supine bridge 2 x 10  Supine TA march 2 x 10   Sideplank 2 x 20 sec each  Wall squats 2 x 10  Squat to table with cues for form mulitple reps Squats with BUE support cues for form 2 x 10  Updated HEP  Therapeutic Activity: Re-assessment to determine overall progress, educating patient on progress towards goals.    [facility] Adult PT Treatment:                                                DATE: 05/14/22 No charge for the following:  Therapeutic Exercise: 90/90 brace  STS 10 # - cues for control  Plank on elbows and toes 25 sec   Therapeutic Activity: Squatting vs lifting with cues for hip hinge to pickup 5# from floor.  STS with 10# KB - cues for neutral spine     [facility] Adult PT Treatment:                                                DATE:  04/30/22 Therapeutic Exercise: STS x 10  Plank on elbows 5 sec, 10 sec , 10 sec  Open books with hand on chest due to spacing in clinic room Bird dogs 5 sec x 10- verbal cues for correct form Side hip abduction- tactile cues for positioning , 10 x 2 each  90/90 10 sec holds  x 5 SLR with Ab brace x 10 each  Bridge 5 sec x 15          PATIENT EDUCATION:  Education details: see treatment  Person educated: Patient Education method: Explanation, Demonstration, Tactile cues, Verbal cues, and Handouts Education comprehension: verbalized understanding, returned demonstration, verbal cues required, tactile cues required, and needs further education   HOME EXERCISE PROGRAM: Access Code: [email or web address] URL: [email or web address] Date: 04/08/2022 Prepared by: Gwendolyn Grant   Exercises - Supine Bridge  - 2 x daily - 7 x weekly - 2 sets - 10 reps - Sidelying Hip Abduction  - 2 x daily - 7 x weekly - 2 sets - 10 reps - Supine Active Straight Leg Raise  - 2 x daily - 7 x weekly - 2 sets - 10 reps - Supine Posterior Pelvic Tilt  - 2 x daily - 7 x weekly - 2 sets - 10 reps Added 04/16/22 - Supine 90/90 Abdominal Bracing  - 2 x daily - 7 x weekly - 1 sets - 10 reps - 5 hold - Bird  Dog  - 2 x daily - 7 x weekly - 1 sets - 10 reps - 5 hold Added 04/30/22 - Standard Plank  - 1 x daily - 7 x weekly - 1 sets - 5 reps - 10-20 hold - Sit to stand with control  - 1 x daily - 7 x weekly - 2 sets - 10 reps   ASSESSMENT:   CLINICAL IMPRESSION: Patient is making gradual functional progress in regards to her chronic back pain since the start of care. Until recently, her sessions have focused on foundational core/hip strengthening with patient demonstrating good core activation/awareness in gravity eliminated positioning. She will benefit from continued skilled PT 1 x week for 6 additional weeks to fully integrate dynamic core stabilization into standing and lifting activity in order to improve her overall function and assist in pain reduction.    OBJECTIVE IMPAIRMENTS: decreased activity tolerance, decreased endurance, decreased knowledge of condition, difficulty walking, decreased strength, increased fascial restrictions, impaired flexibility, improper body mechanics, postural dysfunction, and pain.    ACTIVITY LIMITATIONS: carrying, lifting, bending, sitting, standing, squatting, and locomotion level   PARTICIPATION LIMITATIONS: meal prep, cleaning, laundry, shopping, and school   PERSONAL FACTORS: Age, Fitness, Profession, and Time since onset of injury/illness/exacerbation are also affecting patient's functional outcome.    REHAB POTENTIAL: Good   CLINICAL DECISION MAKING: Stable/uncomplicated   EVALUATION COMPLEXITY: Low     GOALS: Goals reviewed with patient? Yes   SHORT TERM GOALS: Target date: 04/29/22   Patient will be independent and compliant with initial HEP.    Baseline: issued at eval  Goal status: GOAL MET    2.  Patient will maintain plank on elbows with proper form for at least 10 seconds to signify improvements in lumbopelvic stability.  Baseline: unable  Status: can hold 10 sec 04/30/22 Goal status: GOAL MET   3.  Patient will demonstrate proper  bending/lifting mechanics to reduce stress on her back with lifting her daughter.  Baseline: aberrant mechanics  Status: excessive trunk flexion; cues for proper mechanics  Goal status: ONGOING  LONG TERM GOALS: Target date: 05/20/22   Patient will demonstrate at least 4+/5 bilateral hip strength to improve stability about the chain with standing activity.  Baseline: see above  Goal status: ONGOING   2.  Patient will be able to lift at least 20 lbs with proper form without an increase in back pain.  Baseline: aberrant mechanics, increased pain  Status: 05/14/22: min increased pain with lifting 5# from floor Status: 05/21/22: progressing lifting as appropriate Goal status: ONGOING   3.  Patient will report pain as </= 4/10 to reduce her current functional limitations.  Baseline: see above  Status: 05/14/22: pain level 5/10 Status: 05/21/22: 9/10 at worst  Goal status: ONGOING   PLAN:   PT FREQUENCY: 1x/week   PT DURATION: 6 weeks   PLANNED INTERVENTIONS: Therapeutic exercises, Therapeutic activity, Neuromuscular re-education, Balance training, Patient/Family education, Self Care, Dry Needling, Cryotherapy, Moist heat, Manual therapy, and Re-evaluation.   PLAN FOR NEXT SESSION: review and progress HEP prn; core and hip strengthening   Samantha [place]a, PT, DPT, ATC 05/21/22 12:39 PM  Check all possible CPT codes: 97164 - PT Re-evaluation, 97110- Therapeutic Exercise, 97112- Neuro Re-education, 97140 - Manual Therapy, 97530 - Therapeutic Activities, and 97535 - Self Care                         Check all conditions that are expected to impact treatment: None of these apply      If treatment provided at initial evaluation, no treatment charged due to lack of authorization.      

## 2022-05-21 ENCOUNTER — Ambulatory Visit: Payer: Medicaid Other

## 2022-05-21 DIAGNOSIS — M5459 Other low back pain: Secondary | ICD-10-CM | POA: Diagnosis not present

## 2022-05-21 DIAGNOSIS — M6281 Muscle weakness (generalized): Secondary | ICD-10-CM

## 2022-05-21 DIAGNOSIS — M546 Pain in thoracic spine: Secondary | ICD-10-CM

## 2022-05-21 DIAGNOSIS — R293 Abnormal posture: Secondary | ICD-10-CM

## 2022-05-28 ENCOUNTER — Ambulatory Visit: Payer: Medicaid Other

## 2022-05-28 DIAGNOSIS — M5459 Other low back pain: Secondary | ICD-10-CM

## 2022-05-28 DIAGNOSIS — R293 Abnormal posture: Secondary | ICD-10-CM | POA: Diagnosis not present

## 2022-05-28 DIAGNOSIS — M6281 Muscle weakness (generalized): Secondary | ICD-10-CM | POA: Diagnosis not present

## 2022-05-28 DIAGNOSIS — M546 Pain in thoracic spine: Secondary | ICD-10-CM

## 2022-05-28 NOTE — Therapy (Signed)
OUTPATIENT PHYSICAL THERAPY TREATMENT NOTE   Patient Name: Jacqueline Miranda MRN: [id number] DOB:04-Jun-2000, 21 y.o., female Today's Date: 05/28/2022  PCP: Rise Patience, DO     REFERRING PROVIDER: Zenia Resides, MD   END OF SESSION:   PT End of Session - 05/28/22 0931     Visit Number 6    Number of Visits 11    Date for PT Re-Evaluation 07/03/22    Authorization Type [employer]- [employer]- requesting additional auth    Authorization Time Period 12/22-2/19/24    Authorization - Visit Number 1    Authorization - Number of Visits 5    PT Start Time 0932    PT Stop Time 1015    PT Time Calculation (min) 43 min    Activity Tolerance Patient tolerated treatment well    Behavior During Therapy [facility] for tasks assessed/performed              Past Medical History:  Diagnosis Date   Anemia    Past Surgical History:  Procedure Laterality Date   TONSILLECTOMY     Patient Active Problem List   Diagnosis Date Noted   Stomach pain 03/29/2022   Back pain 05/19/2021   Acute reaction to situational stress 09/04/2020   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   Anemia in pregnancy 06/28/2020   Toothache 04/21/2020   Leg pain, bilateral 04/21/2020   Blurred vision 04/21/2020    REFERRING DIAG: M54.50,G89.29 (ICD-10-CM) - Chronic bilateral low back pain without sciatica   THERAPY DIAG:  Other low back pain  Pain in thoracic spine  Muscle weakness (generalized)  Abnormal posture  Rationale for Evaluation and Treatment rehabilitation  PERTINENT HISTORY: None  PRECAUTIONS: None  SUBJECTIVE:                                                                                                                                                                                     Patient declined interpreter.  SUBJECTIVE STATEMENT: Patient reports she started going to the gym and is sore, but is not experiencing pain.   PAIN:  Are you having pain? No    OBJECTIVE: (objective measures  completed at initial evaluation unless otherwise dated)   DIAGNOSTIC FINDINGS:  None    PATIENT SURVEYS:  N/A language    SCREENING FOR RED FLAGS: Bowel or bladder incontinence: No Spinal tumors: No Cauda equina syndrome: No Compression fracture: No Abdominal aneurysm: No   COGNITION: Overall cognitive status: Within functional limits for tasks assessed                          SENSATION: WFL   MUSCLE LENGTH:  Hamstrings: WNL Thomas test: (+) Hip flexor bilaterally    POSTURE: anterior pelvic tilt   PALPATION: TTP bilateral thoracolumbar paraspinals    LUMBAR ROM:    AROM eval 05/14/22 05/21/22  Flexion WNL LBP WNL WNL LBP  Extension WNL LBP WNL LBP WNL LBP  Right lateral flexion WNL  WNL  Left lateral flexion WNL  WNL  Right rotation WNL  WNL  Left rotation WNL  WNL   (Blank rows = not tested)   LOWER EXTREMITY MMT:     MMT Right eval Left eval Right 05/14/22 Left 05/14/22 05/21/22   Hip flexion 4- 4- 4 4+ Bilateral 4/5   Hip extension 3+ 3+ 3+ 4- Bilateral 4/5   Hip abduction 3+ 3+ 4 (gives after 2 sec) 4 (gives after 2 sec) Bilateral 4-/5  Hip adduction         Hip internal rotation         Hip external rotation         Knee flexion 5 5     Knee extension 5 5     Ankle dorsiflexion 5 5     Ankle plantarflexion 5 5     Ankle inversion         Core 2                                3   (Blank rows = not tested)   LUMBAR SPECIAL TESTS:  (-) SLR    FUNCTIONAL TESTS:  Plank: unable : 04/30/22: able to hold 10 sec: 05/14/22: 25 sec Squat: excessive trunk flexion, limited hip/knee flexion.  05/14/22: Ab crunch: can get scapula off mat   05/21/22: 33 seconds plank; squat: excessive trunk flexion    GAIT: Distance walked: 10 ft  Assistive device utilized: None Level of assistance: Complete Independence Comments: WNL  [facility] Adult PT Treatment:                                                DATE: 05/28/22 Therapeutic Exercise: NuStep level 6 x 5  minutes UE/LE  90/90 isometric hold 3 x 30 sec 90/90 march 2 x 10  Sit to stand 2 x 10  Bodyweight squats 2 x 10  Cybex resisted hip flexion 2 x 10 @ 25 lbs  Cybex resisted hip abduction 2 x 10 @ 17.5 lbs  Leg press 2 x 10 @ 40 lbs  Sideplank 2 x 30 sec Updated HEP   [facility] Adult PT Treatment:                                                DATE: 05/21/22 Therapeutic Exercise: Supine bridge 2 x 10  Supine TA march 2 x 10  Sideplank 2 x 20 sec each  Wall squats 2 x 10  Squat to table with cues for form mulitple reps Squats with BUE support cues for form 2 x 10  Updated HEP  Therapeutic Activity: Re-assessment to determine overall progress, educating patient on progress towards goals.    [facility] Adult PT Treatment:  DATE: 05/14/22 No charge for the following:  Therapeutic Exercise: 90/90 brace  STS 10 # - cues for control  Plank on elbows and toes 25 sec   Therapeutic Activity: Squatting vs lifting with cues for hip hinge to pickup 5# from floor.  STS with 10# KB - cues for neutral spine          PATIENT EDUCATION:  Education details: see treatment  Person educated: Patient Education method: Explanation, Demonstration, Tactile cues, Verbal cues, and Handouts Education comprehension: verbalized understanding, returned demonstration, verbal cues required, tactile cues required, and needs further education   HOME EXERCISE PROGRAM: Access Code: [email or web address] URL: [email or web address] Date: 04/08/2022 Prepared by: Gwendolyn Grant   Exercises - Supine Bridge  - 2 x daily - 7 x weekly - 2 sets - 10 reps - Sidelying Hip Abduction  - 2 x daily - 7 x weekly - 2 sets - 10 reps - Supine Active Straight Leg Raise  - 2 x daily - 7 x weekly - 2 sets - 10 reps - Supine Posterior Pelvic Tilt  - 2 x daily - 7 x weekly - 2 sets - 10 reps Added 04/16/22 - Supine 90/90 Abdominal Bracing  - 2 x daily - 7 x weekly - 1 sets - 10 reps - 5  hold - Bird Dog  - 2 x daily - 7 x weekly - 1 sets - 10 reps - 5 hold Added 04/30/22 - Standard Plank  - 1 x daily - 7 x weekly - 1 sets - 5 reps - 10-20 hold - Sit to stand with control  - 1 x daily - 7 x weekly - 2 sets - 10 reps   ASSESSMENT:   CLINICAL IMPRESSION: Patient arrives without reports of back pain. Focused on progression of dynamic core stabilization with patient quickly fatiguing with 90/90 progression. She demonstrates good form with sit to stand and body weight squats having met this STG. Introduced Patent examiner today with patient demonstrating good lumbopelvic stability without onset of back pain.    OBJECTIVE IMPAIRMENTS: decreased activity tolerance, decreased endurance, decreased knowledge of condition, difficulty walking, decreased strength, increased fascial restrictions, impaired flexibility, improper body mechanics, postural dysfunction, and pain.    ACTIVITY LIMITATIONS: carrying, lifting, bending, sitting, standing, squatting, and locomotion level   PARTICIPATION LIMITATIONS: meal prep, cleaning, laundry, shopping, and school   PERSONAL FACTORS: Age, Fitness, Profession, and Time since onset of injury/illness/exacerbation are also affecting patient's functional outcome.    REHAB POTENTIAL: Good   CLINICAL DECISION MAKING: Stable/uncomplicated   EVALUATION COMPLEXITY: Low     GOALS: Goals reviewed with patient? Yes   SHORT TERM GOALS: Target date: 04/29/22   Patient will be independent and compliant with initial HEP.    Baseline: issued at eval  Goal status: GOAL MET    2.  Patient will maintain plank on elbows with proper form for at least 10 seconds to signify improvements in lumbopelvic stability.  Baseline: unable  Status: can hold 10 sec 04/30/22 Goal status: GOAL MET   3.  Patient will demonstrate proper bending/lifting mechanics to reduce stress on her back with lifting her daughter.  Baseline: aberrant mechanics  Status: normal  squat mechanics 05/28/22 Goal status: met     LONG TERM GOALS: Target date: 05/20/22   Patient will demonstrate at least 4+/5 bilateral hip strength to improve stability about the chain with standing activity.  Baseline: see above  Goal status: ONGOING   2.  Patient will be able to  lift at least 20 lbs with proper form without an increase in back pain.  Baseline: aberrant mechanics, increased pain  Status: 05/14/22: min increased pain with lifting 5# from floor Status: 05/21/22: progressing lifting as appropriate Goal status: ONGOING   3.  Patient will report pain as </= 4/10 to reduce her current functional limitations.  Baseline: see above  Status: 05/14/22: pain level 5/10 Status: 05/21/22: 9/10 at worst  Goal status: ONGOING   PLAN:   PT FREQUENCY: 1x/week   PT DURATION: 6 weeks   PLANNED INTERVENTIONS: Therapeutic exercises, Therapeutic activity, Neuromuscular re-education, Balance training, Patient/Family education, Self Care, Dry Needling, Cryotherapy, Moist heat, Manual therapy, and Re-evaluation.   PLAN FOR NEXT SESSION: review and progress HEP prn; core and hip strengthening   Gwendolyn Grant, PT, DPT, ATC 05/28/22 10:20 AM

## 2022-06-03 ENCOUNTER — Ambulatory Visit: Payer: Medicaid Other | Attending: Family Medicine

## 2022-06-03 DIAGNOSIS — R293 Abnormal posture: Secondary | ICD-10-CM | POA: Diagnosis present

## 2022-06-03 DIAGNOSIS — M546 Pain in thoracic spine: Secondary | ICD-10-CM | POA: Insufficient documentation

## 2022-06-03 DIAGNOSIS — M6281 Muscle weakness (generalized): Secondary | ICD-10-CM | POA: Diagnosis present

## 2022-06-03 DIAGNOSIS — M5459 Other low back pain: Secondary | ICD-10-CM | POA: Insufficient documentation

## 2022-06-03 NOTE — Therapy (Signed)
OUTPATIENT PHYSICAL THERAPY TREATMENT NOTE   Patient Name: Jacqueline Miranda MRN: [id number] DOB:November 09, 2000, 22 y.o., female Today's Date: 06/03/2022  PCP: Rise Patience, DO     REFERRING PROVIDER: Zenia Resides, MD   END OF SESSION:   PT End of Session - 06/03/22 0937     Visit Number 7    Number of Visits 11    Date for PT Re-Evaluation 07/03/22    Authorization Type [employer]-    Authorization Time Period 12/22-2/19/24    Authorization - Visit Number 2    Authorization - Number of Visits 5    PT Start Time 0936    PT Stop Time 1015    PT Time Calculation (min) 39 min    Activity Tolerance Patient tolerated treatment well    Behavior During Therapy [facility] for tasks assessed/performed              Past Medical History:  Diagnosis Date   Anemia    Past Surgical History:  Procedure Laterality Date   TONSILLECTOMY     Patient Active Problem List   Diagnosis Date Noted   Stomach pain 03/29/2022   Back pain 05/19/2021   Acute reaction to situational stress 09/04/2020   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   Anemia in pregnancy 06/28/2020   Toothache 04/21/2020   Leg pain, bilateral 04/21/2020   Blurred vision 04/21/2020    REFERRING DIAG: M54.50,G89.29 (ICD-10-CM) - Chronic bilateral low back pain without sciatica   THERAPY DIAG:  Other low back pain  Pain in thoracic spine  Muscle weakness (generalized)  Abnormal posture  Rationale for Evaluation and Treatment rehabilitation  PERTINENT HISTORY: None  PRECAUTIONS: None  SUBJECTIVE:                                                                                                                                                                                     Patient declined interpreter.  SUBJECTIVE STATEMENT: Patient reports her back feels good right now without pain, but was hurting last night when she was playing on the floor with her daughter.   PAIN:  Are you having pain? No    OBJECTIVE:  (objective measures completed at initial evaluation unless otherwise dated)   DIAGNOSTIC FINDINGS:  None    PATIENT SURVEYS:  N/A language    SCREENING FOR RED FLAGS: Bowel or bladder incontinence: No Spinal tumors: No Cauda equina syndrome: No Compression fracture: No Abdominal aneurysm: No   COGNITION: Overall cognitive status: Within functional limits for tasks assessed  SENSATION: WFL   MUSCLE LENGTH: Hamstrings: WNL Thomas test: (+) Hip flexor bilaterally    POSTURE: anterior pelvic tilt   PALPATION: TTP bilateral thoracolumbar paraspinals    LUMBAR ROM:    AROM eval 05/14/22 05/21/22  Flexion WNL LBP WNL WNL LBP  Extension WNL LBP WNL LBP WNL LBP  Right lateral flexion WNL  WNL  Left lateral flexion WNL  WNL  Right rotation WNL  WNL  Left rotation WNL  WNL   (Blank rows = not tested)   LOWER EXTREMITY MMT:     MMT Right eval Left eval Right 05/14/22 Left 05/14/22 05/21/22   Hip flexion 4- 4- 4 4+ Bilateral 4/5   Hip extension 3+ 3+ 3+ 4- Bilateral 4/5   Hip abduction 3+ 3+ 4 (gives after 2 sec) 4 (gives after 2 sec) Bilateral 4-/5  Hip adduction         Hip internal rotation         Hip external rotation         Knee flexion 5 5     Knee extension 5 5     Ankle dorsiflexion 5 5     Ankle plantarflexion 5 5     Ankle inversion         Core 2                                3   (Blank rows = not tested)   LUMBAR SPECIAL TESTS:  (-) SLR    FUNCTIONAL TESTS:  Plank: unable : 04/30/22: able to hold 10 sec: 05/14/22: 25 sec Squat: excessive trunk flexion, limited hip/knee flexion.  05/14/22: Ab crunch: can get scapula off mat   05/21/22: 33 seconds plank; squat: excessive trunk flexion    GAIT: Distance walked: 10 ft  Assistive device utilized: None Level of assistance: Complete Independence Comments: WNL  [facility] Adult PT Treatment:                                                DATE: 06/03/22 Therapeutic  Exercise: Elliptical level 3 x 5 minutes  Leg press 3 x 10 @ 50 lbs  Forward lunge 2 x 10 each  Wall squat with overhead reach 2 x 8 Plank on elbows to fatigue 5 trials  Quadruped alternating arm extension 2 x 10  Updated HEP     [facility] Adult PT Treatment:                                                DATE: 05/28/22 Therapeutic Exercise: NuStep level 6 x 5 minutes UE/LE  90/90 isometric hold 3 x 30 sec 90/90 march 2 x 10  Sit to stand 2 x 10  Bodyweight squats 2 x 10  Cybex resisted hip flexion 2 x 10 @ 25 lbs  Cybex resisted hip abduction 2 x 10 @ 17.5 lbs  Leg press 2 x 10 @ 40 lbs  Sideplank 2 x 30 sec Updated HEP   [facility] Adult PT Treatment:  DATE: 05/21/22 Therapeutic Exercise: Supine bridge 2 x 10  Supine TA march 2 x 10  Sideplank 2 x 20 sec each  Wall squats 2 x 10  Squat to table with cues for form mulitple reps Squats with BUE support cues for form 2 x 10  Updated HEP  Therapeutic Activity: Re-assessment to determine overall progress, educating patient on progress towards goals.        PATIENT EDUCATION:  Education details: see treatment  Person educated: Patient Education method: Explanation, Demonstration, Tactile cues, Verbal cues, and Handouts Education comprehension: verbalized understanding, returned demonstration, verbal cues required, tactile cues required, and needs further education   HOME EXERCISE PROGRAM: Access Code: [email or web address] URL: [email or web address] Date: 04/08/2022 Prepared by: Gwendolyn Grant   Exercises - Supine Bridge  - 2 x daily - 7 x weekly - 2 sets - 10 reps - Sidelying Hip Abduction  - 2 x daily - 7 x weekly - 2 sets - 10 reps - Supine Active Straight Leg Raise  - 2 x daily - 7 x weekly - 2 sets - 10 reps - Supine Posterior Pelvic Tilt  - 2 x daily - 7 x weekly - 2 sets - 10 reps Added 04/16/22 - Supine 90/90 Abdominal Bracing  - 2 x daily - 7 x weekly - 1 sets - 10 reps  - 5 hold - Bird Dog  - 2 x daily - 7 x weekly - 1 sets - 10 reps - 5 hold Added 04/30/22 - Standard Plank  - 1 x daily - 7 x weekly - 1 sets - 5 reps - 10-20 hold - Sit to stand with control  - 1 x daily - 7 x weekly - 2 sets - 10 reps   ASSESSMENT:   CLINICAL IMPRESSION: Patient arrives without reports of back pain. Focused on progression of closed chain strengthening. With lunges she has difficulty stabilizing through her trunk as she has tendency to lean or rotate. She reported mild pain in her back following lunge activity that subsided with rest. Able to progress dynamic core stabilization with patient quickly fatiguing and having tendency to revert to anterior pelvic tilt as she fatigues.    OBJECTIVE IMPAIRMENTS: decreased activity tolerance, decreased endurance, decreased knowledge of condition, difficulty walking, decreased strength, increased fascial restrictions, impaired flexibility, improper body mechanics, postural dysfunction, and pain.    ACTIVITY LIMITATIONS: carrying, lifting, bending, sitting, standing, squatting, and locomotion level   PARTICIPATION LIMITATIONS: meal prep, cleaning, laundry, shopping, and school   PERSONAL FACTORS: Age, Fitness, Profession, and Time since onset of injury/illness/exacerbation are also affecting patient's functional outcome.    REHAB POTENTIAL: Good   CLINICAL DECISION MAKING: Stable/uncomplicated   EVALUATION COMPLEXITY: Low     GOALS: Goals reviewed with patient? Yes   SHORT TERM GOALS: Target date: 04/29/22   Patient will be independent and compliant with initial HEP.    Baseline: issued at eval  Goal status: GOAL MET    2.  Patient will maintain plank on elbows with proper form for at least 10 seconds to signify improvements in lumbopelvic stability.  Baseline: unable  Status: can hold 10 sec 04/30/22 Goal status: GOAL MET   3.  Patient will demonstrate proper bending/lifting mechanics to reduce stress on her back with  lifting her daughter.  Baseline: aberrant mechanics  Status: normal squat mechanics 05/28/22 Goal status: met     LONG TERM GOALS: Target date: 05/20/22   Patient will demonstrate at least 4+/5 bilateral hip strength to improve  stability about the chain with standing activity.  Baseline: see above  Goal status: ONGOING   2.  Patient will be able to lift at least 20 lbs with proper form without an increase in back pain.  Baseline: aberrant mechanics, increased pain  Status: 05/14/22: min increased pain with lifting 5# from floor Status: 05/21/22: progressing lifting as appropriate Goal status: ONGOING   3.  Patient will report pain as </= 4/10 to reduce her current functional limitations.  Baseline: see above  Status: 05/14/22: pain level 5/10 Status: 05/21/22: 9/10 at worst  Goal status: ONGOING   PLAN:   PT FREQUENCY: 1x/week   PT DURATION: 6 weeks   PLANNED INTERVENTIONS: Therapeutic exercises, Therapeutic activity, Neuromuscular re-education, Balance training, Patient/Family education, Self Care, Dry Needling, Cryotherapy, Moist heat, Manual therapy, and Re-evaluation.   PLAN FOR NEXT SESSION: review and progress HEP prn; core and hip strengthening   Gwendolyn Grant, PT, DPT, ATC 06/03/22 10:16 AM

## 2022-06-11 ENCOUNTER — Ambulatory Visit: Payer: Medicaid Other

## 2022-06-11 DIAGNOSIS — M546 Pain in thoracic spine: Secondary | ICD-10-CM

## 2022-06-11 DIAGNOSIS — R293 Abnormal posture: Secondary | ICD-10-CM

## 2022-06-11 DIAGNOSIS — M6281 Muscle weakness (generalized): Secondary | ICD-10-CM

## 2022-06-11 DIAGNOSIS — M5459 Other low back pain: Secondary | ICD-10-CM

## 2022-06-11 NOTE — Therapy (Signed)
OUTPATIENT PHYSICAL THERAPY TREATMENT NOTE   Patient Name: Jacqueline Miranda MRN: [id number] DOB:12-18-2000, 22 y.o., female Today's Date: 06/11/2022  PCP: Rise Patience, DO     REFERRING PROVIDER: Zenia Resides, MD   END OF SESSION:   PT End of Session - 06/11/22 0938     Visit Number 8    Number of Visits 11    Date for PT Re-Evaluation 07/03/22    Authorization Type [employer]- [employer]-    Authorization Time Period 12/22-2/19/24    Authorization - Visit Number 3    Authorization - Number of Visits 5    PT Start Time [phone]   patient late   PT Stop Time 1014    PT Time Calculation (min) 35 min    Activity Tolerance Patient tolerated treatment well    Behavior During Therapy [facility] for tasks assessed/performed              Past Medical History:  Diagnosis Date   Anemia    Past Surgical History:  Procedure Laterality Date   TONSILLECTOMY     Patient Active Problem List   Diagnosis Date Noted   Stomach pain 03/29/2022   Back pain 05/19/2021   Acute reaction to situational stress 09/04/2020   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   Anemia in pregnancy 06/28/2020   Toothache 04/21/2020   Leg pain, bilateral 04/21/2020   Blurred vision 04/21/2020    REFERRING DIAG: M54.50,G89.29 (ICD-10-CM) - Chronic bilateral low back pain without sciatica   THERAPY DIAG:  Other low back pain  Pain in thoracic spine  Muscle weakness (generalized)  Abnormal posture  Rationale for Evaluation and Treatment rehabilitation  PERTINENT HISTORY: None  PRECAUTIONS: None  SUBJECTIVE:                                                                                                                                                                                     Patient declined interpreter.  SUBJECTIVE STATEMENT: "It's ok. A little bit of pain."  PAIN:  Are you having pain? Yes: NPRS scale: 4/10 Pain location: low back Pain description: tight, intermittent Aggravating factors:  sitting Relieving factors: unknown    OBJECTIVE: (objective measures completed at initial evaluation unless otherwise dated)   DIAGNOSTIC FINDINGS:  None    PATIENT SURVEYS:  N/A language    SCREENING FOR RED FLAGS: Bowel or bladder incontinence: No Spinal tumors: No Cauda equina syndrome: No Compression fracture: No Abdominal aneurysm: No   COGNITION: Overall cognitive status: Within functional limits for tasks assessed  SENSATION: WFL   MUSCLE LENGTH: Hamstrings: WNL Thomas test: (+) Hip flexor bilaterally    POSTURE: anterior pelvic tilt   PALPATION: TTP bilateral thoracolumbar paraspinals    LUMBAR ROM:    AROM eval 05/14/22 05/21/22  Flexion WNL LBP WNL WNL LBP  Extension WNL LBP WNL LBP WNL LBP  Right lateral flexion WNL  WNL  Left lateral flexion WNL  WNL  Right rotation WNL  WNL  Left rotation WNL  WNL   (Blank rows = not tested)   LOWER EXTREMITY MMT:     MMT Right eval Left eval Right 05/14/22 Left 05/14/22 05/21/22   Hip flexion 4- 4- 4 4+ Bilateral 4/5   Hip extension 3+ 3+ 3+ 4- Bilateral 4/5   Hip abduction 3+ 3+ 4 (gives after 2 sec) 4 (gives after 2 sec) Bilateral 4-/5  Hip adduction         Hip internal rotation         Hip external rotation         Knee flexion 5 5     Knee extension 5 5     Ankle dorsiflexion 5 5     Ankle plantarflexion 5 5     Ankle inversion         Core 2                                3   (Blank rows = not tested)   LUMBAR SPECIAL TESTS:  (-) SLR    FUNCTIONAL TESTS:  Plank: unable : 04/30/22: able to hold 10 sec: 05/14/22: 25 sec Squat: excessive trunk flexion, limited hip/knee flexion.  05/14/22: Ab crunch: can get scapula off mat   05/21/22: 33 seconds plank; squat: excessive trunk flexion   06/11/22 plank: 21 seconds    GAIT: Distance walked: 10 ft  Assistive device utilized: None Level of assistance: Complete Independence Comments: WNL [facility] Adult PT Treatment:                                                 DATE: 06/11/22 Therapeutic Exercise: Elliptical level 3 x 5 minutes  Seated TA march 2 x 10  Seated march with opposite arm reaching 2 x 10  Lunge hip flexor stretch 2 x 30 sec  Plank 5 trials to fatigue 15 seconds- 21 seconds  Fire hydrant 2 x 10  Quadruped leg extension 2 x 10  Updated HEP    [facility] Adult PT Treatment:                                                DATE: 06/03/22 Therapeutic Exercise: Elliptical level 3 x 5 minutes  Leg press 3 x 10 @ 50 lbs  Forward lunge 2 x 10 each  Wall squat with overhead reach 2 x 8 Plank on elbows to fatigue 5 trials  Quadruped alternating arm extension 2 x 10  Updated HEP     [facility] Adult PT Treatment:  DATE: 05/28/22 Therapeutic Exercise: NuStep level 6 x 5 minutes UE/LE  90/90 isometric hold 3 x 30 sec 90/90 march 2 x 10  Sit to stand 2 x 10  Bodyweight squats 2 x 10  Cybex resisted hip flexion 2 x 10 @ 25 lbs  Cybex resisted hip abduction 2 x 10 @ 17.5 lbs  Leg press 2 x 10 @ 40 lbs  Sideplank 2 x 30 sec Updated HEP       PATIENT EDUCATION:  Education details: see treatment  Person educated: Patient Education method: Explanation, Demonstration, Tactile cues, Verbal cues, and Handouts Education comprehension: verbalized understanding, returned demonstration, verbal cues required, tactile cues required, and needs further education   HOME EXERCISE PROGRAM: Access Code: [email or web address] URL: [email or web address] Date: 04/08/2022 Prepared by: Gwendolyn Grant   Exercises - Supine Bridge  - 2 x daily - 7 x weekly - 2 sets - 10 reps - Sidelying Hip Abduction  - 2 x daily - 7 x weekly - 2 sets - 10 reps - Supine Active Straight Leg Raise  - 2 x daily - 7 x weekly - 2 sets - 10 reps - Supine Posterior Pelvic Tilt  - 2 x daily - 7 x weekly - 2 sets - 10 reps Added 04/16/22 - Supine 90/90 Abdominal Bracing  - 2 x daily - 7 x weekly - 1 sets  - 10 reps - 5 hold - Bird Dog  - 2 x daily - 7 x weekly - 1 sets - 10 reps - 5 hold Added 04/30/22 - Standard Plank  - 1 x daily - 7 x weekly - 1 sets - 5 reps - 10-20 hold - Sit to stand with control  - 1 x daily - 7 x weekly - 2 sets - 10 reps   ASSESSMENT:   CLINICAL IMPRESSION: Patient arrives with mild low back pain that she reports is worsened with prolonged sitting. Worked on progressing of dynamic core stabilization with patient having difficulty maintaining neutral spine with sitting activity. When she does properly engage her core with sitting activity she reports a reduction in her pain. With quadruped strengthening, she requires tactile cues to maintain lumbopelvic stability. HEP updated to include further strengthening and hip flexor stretching. No change in pain at conclusion of session.    OBJECTIVE IMPAIRMENTS: decreased activity tolerance, decreased endurance, decreased knowledge of condition, difficulty walking, decreased strength, increased fascial restrictions, impaired flexibility, improper body mechanics, postural dysfunction, and pain.    ACTIVITY LIMITATIONS: carrying, lifting, bending, sitting, standing, squatting, and locomotion level   PARTICIPATION LIMITATIONS: meal prep, cleaning, laundry, shopping, and school   PERSONAL FACTORS: Age, Fitness, Profession, and Time since onset of injury/illness/exacerbation are also affecting patient's functional outcome.    REHAB POTENTIAL: Good   CLINICAL DECISION MAKING: Stable/uncomplicated   EVALUATION COMPLEXITY: Low     GOALS: Goals reviewed with patient? Yes   SHORT TERM GOALS: Target date: 04/29/22   Patient will be independent and compliant with initial HEP.    Baseline: issued at eval  Goal status: GOAL MET    2.  Patient will maintain plank on elbows with proper form for at least 10 seconds to signify improvements in lumbopelvic stability.  Baseline: unable  Status: can hold 10 sec 04/30/22 Goal status:  GOAL MET   3.  Patient will demonstrate proper bending/lifting mechanics to reduce stress on her back with lifting her daughter.  Baseline: aberrant mechanics  Status: normal squat mechanics 05/28/22 Goal status: met  LONG TERM GOALS: Target date: 05/20/22   Patient will demonstrate at least 4+/5 bilateral hip strength to improve stability about the chain with standing activity.  Baseline: see above  Goal status: ONGOING   2.  Patient will be able to lift at least 20 lbs with proper form without an increase in back pain.  Baseline: aberrant mechanics, increased pain  Status: 05/14/22: min increased pain with lifting 5# from floor Status: 05/21/22: progressing lifting as appropriate Goal status: ONGOING   3.  Patient will report pain as </= 4/10 to reduce her current functional limitations.  Baseline: see above  Status: 05/14/22: pain level 5/10 Status: 05/21/22: 9/10 at worst  Goal status: ONGOING   PLAN:   PT FREQUENCY: 1x/week   PT DURATION: 6 weeks   PLANNED INTERVENTIONS: Therapeutic exercises, Therapeutic activity, Neuromuscular re-education, Balance training, Patient/Family education, Self Care, Dry Needling, Cryotherapy, Moist heat, Manual therapy, and Re-evaluation.   PLAN FOR NEXT SESSION: review and progress HEP prn; core and hip strengthening   Letitia Libra, PT, DPT, ATC 06/11/22 10:15 AM

## 2022-06-17 NOTE — Therapy (Signed)
OUTPATIENT PHYSICAL THERAPY TREATMENT NOTE   Patient Name: Jacqueline Miranda MRN: [id number] DOB:2000/06/10, 22 y.o., female Today's Date: 06/18/2022  PCP: Rise Patience, DO     REFERRING PROVIDER: Zenia Resides, MD   END OF SESSION:   PT End of Session - 06/18/22 0931     Visit Number 9    Number of Visits 11    Date for PT Re-Evaluation 07/03/22    Authorization Type [employer]- [employer]-    Authorization Time Period 12/22-2/19/24    Authorization - Visit Number 4    Authorization - Number of Visits 5    PT Start Time 0931    PT Stop Time 1012    PT Time Calculation (min) 41 min    Activity Tolerance Patient tolerated treatment well    Behavior During Therapy [facility] for tasks assessed/performed               Past Medical History:  Diagnosis Date   Anemia    Past Surgical History:  Procedure Laterality Date   TONSILLECTOMY     Patient Active Problem List   Diagnosis Date Noted   Stomach pain 03/29/2022   Back pain 05/19/2021   Acute reaction to situational stress 09/04/2020   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   Anemia in pregnancy 06/28/2020   Toothache 04/21/2020   Leg pain, bilateral 04/21/2020   Blurred vision 04/21/2020    REFERRING DIAG: M54.50,G89.29 (ICD-10-CM) - Chronic bilateral low back pain without sciatica   THERAPY DIAG:  Other low back pain  Pain in thoracic spine  Muscle weakness (generalized)  Abnormal posture  Rationale for Evaluation and Treatment rehabilitation  PERTINENT HISTORY: None  PRECAUTIONS: None  SUBJECTIVE:                                                                                                                                                                                     Patient declined interpreter.  SUBJECTIVE STATEMENT: Patient reports her back is feeling good right now without pain, but she has a headache. She reports completing her exercises a few times.   PAIN:  Are you having pain? No    OBJECTIVE:  (objective measures completed at initial evaluation unless otherwise dated)   DIAGNOSTIC FINDINGS:  None    PATIENT SURVEYS:  N/A language    SCREENING FOR RED FLAGS: Bowel or bladder incontinence: No Spinal tumors: No Cauda equina syndrome: No Compression fracture: No Abdominal aneurysm: No   COGNITION: Overall cognitive status: Within functional limits for tasks assessed  SENSATION: WFL   MUSCLE LENGTH: Hamstrings: WNL Thomas test: (+) Hip flexor bilaterally    POSTURE: anterior pelvic tilt   PALPATION: TTP bilateral thoracolumbar paraspinals    LUMBAR ROM:    AROM eval 05/14/22 05/21/22  Flexion WNL LBP WNL WNL LBP  Extension WNL LBP WNL LBP WNL LBP  Right lateral flexion WNL  WNL  Left lateral flexion WNL  WNL  Right rotation WNL  WNL  Left rotation WNL  WNL   (Blank rows = not tested)   LOWER EXTREMITY MMT:     MMT Right eval Left eval Right 05/14/22 Left 05/14/22 05/21/22  06/18/22  Hip flexion 4- 4- 4 4+ Bilateral 4/5    Hip extension 3+ 3+ 3+ 4- Bilateral 4/5    Hip abduction 3+ 3+ 4 (gives after 2 sec) 4 (gives after 2 sec) Bilateral 4-/5   Hip adduction          Hip internal rotation          Hip external rotation          Knee flexion 5 5      Knee extension 5 5      Ankle dorsiflexion 5 5      Ankle plantarflexion 5 5      Ankle inversion          Core 2                                3 3    (Blank rows = not tested)   LUMBAR SPECIAL TESTS:  (-) SLR    FUNCTIONAL TESTS:  Plank: unable : 04/30/22: able to hold 10 sec: 05/14/22: 25 sec Squat: excessive trunk flexion, limited hip/knee flexion.  05/14/22: Ab crunch: can get scapula off mat   05/21/22: 33 seconds plank; squat: excessive trunk flexion   06/11/22 plank: 21 seconds    GAIT: Distance walked: 10 ft  Assistive device utilized: None Level of assistance: Complete Independence Comments: WNL  [facility] Adult PT Treatment:                                                 DATE: 06/18/22 Therapeutic Exercise: Elliptical level 3 x 5 minutes  Dead bug, 90/90 leg extension, 90/90 heel taps all attempted d/c due to pain Supine bent knee fallout 2 x 10  Supine TA march 2 x 10  Figure 4 hip bridge 1 x 10 each Plank 30 sec, 25 sec, 9 sec Deadlift 6# dumbbells 2 x 10      [facility] Adult PT Treatment:                                                DATE: 06/11/22 Therapeutic Exercise: Elliptical level 3 x 5 minutes  Seated TA march 2 x 10  Seated march with opposite arm reaching 2 x 10  Lunge hip flexor stretch 2 x 30 sec  Plank 5 trials to fatigue 15 seconds- 21 seconds  Fire hydrant 2 x 10  Quadruped leg extension 2 x 10  Updated HEP    [facility] Adult PT Treatment:  DATE: 06/03/22 Therapeutic Exercise: Elliptical level 3 x 5 minutes  Leg press 3 x 10 @ 50 lbs  Forward lunge 2 x 10 each  Wall squat with overhead reach 2 x 8 Plank on elbows to fatigue 5 trials  Quadruped alternating arm extension 2 x 10  Updated HEP         PATIENT EDUCATION:  Education details: HEP review Person educated: Patient Education method: Consulting civil engineer,  Education comprehension: verbalized understanding   HOME EXERCISE PROGRAM: Access Code: [email or web address] URL: [email or web address] Date: 04/08/2022 Prepared by: Gwendolyn Grant   Exercises - Supine Bridge  - 2 x daily - 7 x weekly - 2 sets - 10 reps - Sidelying Hip Abduction  - 2 x daily - 7 x weekly - 2 sets - 10 reps - Supine Active Straight Leg Raise  - 2 x daily - 7 x weekly - 2 sets - 10 reps - Supine Posterior Pelvic Tilt  - 2 x daily - 7 x weekly - 2 sets - 10 reps Added 04/16/22 - Supine 90/90 Abdominal Bracing  - 2 x daily - 7 x weekly - 1 sets - 10 reps - 5 hold - Bird Dog  - 2 x daily - 7 x weekly - 1 sets - 10 reps - 5 hold Added 04/30/22 - Standard Plank  - 1 x daily - 7 x weekly - 1 sets - 5 reps - 10-20 hold - Sit to stand with control  - 1 x daily  - 7 x weekly - 2 sets - 10 reps   ASSESSMENT:   CLINICAL IMPRESSION: Patient arrives without reports of back pain. With progression of dynamic core stabilization  patient reported an increase in back pain with varying levels of dead bug progression with patient unable to maintain appropriate core activation, so these exercises were discontinued. She was able to tolerate hooklying core stabilization without onset of pain and good core activation. She was encouraged to complete prescribed HEP to assist in improving her overall strength and endurance with patient verbalizing understanding.    OBJECTIVE IMPAIRMENTS: decreased activity tolerance, decreased endurance, decreased knowledge of condition, difficulty walking, decreased strength, increased fascial restrictions, impaired flexibility, improper body mechanics, postural dysfunction, and pain.    ACTIVITY LIMITATIONS: carrying, lifting, bending, sitting, standing, squatting, and locomotion level   PARTICIPATION LIMITATIONS: meal prep, cleaning, laundry, shopping, and school   PERSONAL FACTORS: Age, Fitness, Profession, and Time since onset of injury/illness/exacerbation are also affecting patient's functional outcome.    REHAB POTENTIAL: Good   CLINICAL DECISION MAKING: Stable/uncomplicated   EVALUATION COMPLEXITY: Low     GOALS: Goals reviewed with patient? Yes   SHORT TERM GOALS: Target date: 04/29/22   Patient will be independent and compliant with initial HEP.    Baseline: issued at eval  Goal status: GOAL MET    2.  Patient will maintain plank on elbows with proper form for at least 10 seconds to signify improvements in lumbopelvic stability.  Baseline: unable  Status: can hold 10 sec 04/30/22 Goal status: GOAL MET   3.  Patient will demonstrate proper bending/lifting mechanics to reduce stress on her back with lifting her daughter.  Baseline: aberrant mechanics  Status: normal squat mechanics 05/28/22 Goal status: met      LONG TERM GOALS: Target date: 05/20/22   Patient will demonstrate at least 4+/5 bilateral hip strength to improve stability about the chain with standing activity.  Baseline: see above  Goal status: ONGOING   2.  Patient will  be able to lift at least 20 lbs with proper form without an increase in back pain.  Baseline: aberrant mechanics, increased pain  Status: 05/14/22: min increased pain with lifting 5# from floor Status: 05/21/22: progressing lifting as appropriate Goal status: ONGOING   3.  Patient will report pain as </= 4/10 to reduce her current functional limitations.  Baseline: see above  Status: 05/14/22: pain level 5/10 Status: 05/21/22: 9/10 at worst  Goal status: ONGOING   PLAN:   PT FREQUENCY: 1x/week   PT DURATION: 6 weeks   PLANNED INTERVENTIONS: Therapeutic exercises, Therapeutic activity, Neuromuscular re-education, Balance training, Patient/Family education, Self Care, Dry Needling, Cryotherapy, Moist heat, Manual therapy, and Re-evaluation.   PLAN FOR NEXT SESSION: review and progress HEP prn; core and hip strengthening   Letitia Libra, PT, DPT, ATC 06/18/22 10:13 AM

## 2022-06-18 ENCOUNTER — Ambulatory Visit: Payer: Medicaid Other

## 2022-06-18 DIAGNOSIS — M5459 Other low back pain: Secondary | ICD-10-CM

## 2022-06-18 DIAGNOSIS — M6281 Muscle weakness (generalized): Secondary | ICD-10-CM

## 2022-06-18 DIAGNOSIS — R293 Abnormal posture: Secondary | ICD-10-CM

## 2022-06-18 DIAGNOSIS — M546 Pain in thoracic spine: Secondary | ICD-10-CM

## 2022-06-24 NOTE — Therapy (Signed)
OUTPATIENT PHYSICAL THERAPY TREATMENT NOTE  PHYSICAL THERAPY DISCHARGE SUMMARY  Visits from Start of Care: 10  Current functional level related to goals / functional outcomes: See goals below    Remaining deficits: See impression below    Education / Equipment: See education below    Patient agrees to discharge. Patient goals were partially met. Patient is being discharged due to lack of progress.  Patient Name: Jacqueline Miranda MRN: [id number] DOB:02/03/2001, 22 y.o., female Today's Date: 06/25/2022  PCP: Evelena Leyden, DO     REFERRING PROVIDER: Moses Manners, MD   END OF SESSION:   PT End of Session - 06/25/22 0933     Visit Number 10    Number of Visits 11    Date for PT Re-Evaluation 07/03/22    Authorization Type [employer]- [employer]-    Authorization Time Period 12/22-2/19/24    Authorization - Visit Number 5    Authorization - Number of Visits 5    PT Start Time 0933    PT Stop Time 1012    PT Time Calculation (min) 39 min    Activity Tolerance Patient tolerated treatment well    Behavior During Therapy [facility] for tasks assessed/performed                Past Medical History:  Diagnosis Date   Anemia    Past Surgical History:  Procedure Laterality Date   TONSILLECTOMY     Patient Active Problem List   Diagnosis Date Noted   Stomach pain 03/29/2022   Back pain 05/19/2021   Acute reaction to situational stress 09/04/2020   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   Anemia in pregnancy 06/28/2020   Toothache 04/21/2020   Leg pain, bilateral 04/21/2020   Blurred vision 04/21/2020    REFERRING DIAG: M54.50,G89.29 (ICD-10-CM) - Chronic bilateral low back pain without sciatica   THERAPY DIAG:  Other low back pain  Pain in thoracic spine  Muscle weakness (generalized)  Abnormal posture  Rationale for Evaluation and Treatment rehabilitation  PERTINENT HISTORY: None  PRECAUTIONS: None  SUBJECTIVE:                                                                                                                                                                                      Patient declined interpreter.  SUBJECTIVE STATEMENT: Patient reports her back is feeling good right now without pain. She has been completing some of her HEP.   PAIN:  Are you having pain? No    OBJECTIVE: (objective measures completed at initial evaluation unless otherwise dated)   DIAGNOSTIC FINDINGS:  None    PATIENT SURVEYS:  N/A language    SCREENING  FOR RED FLAGS: Bowel or bladder incontinence: No Spinal tumors: No Cauda equina syndrome: No Compression fracture: No Abdominal aneurysm: No   COGNITION: Overall cognitive status: Within functional limits for tasks assessed                          SENSATION: WFL   MUSCLE LENGTH: Hamstrings: WNL Thomas test: (+) Hip flexor bilaterally    POSTURE: anterior pelvic tilt   PALPATION: TTP bilateral thoracolumbar paraspinals    LUMBAR ROM:    AROM eval 05/14/22 05/21/22 06/25/22  Flexion WNL LBP WNL WNL LBP WNL  Extension WNL LBP WNL LBP WNL LBP WNL  Right lateral flexion WNL  WNL WNL  Left lateral flexion WNL  WNL WNL  Right rotation WNL  WNL WNL  Left rotation WNL  WNL WNL   (Blank rows = not tested)   LOWER EXTREMITY MMT:     MMT Right eval Left eval Right 05/14/22 Left 05/14/22 05/21/22  06/18/22 06/24/22  Hip flexion 4- 4- 4 4+ Bilateral 4/5   4/5  bilateral   Hip extension 3+ 3+ 3+ 4- Bilateral 4/5   Bilateral 4/5   Hip abduction 3+ 3+ 4 (gives after 2 sec) 4 (gives after 2 sec) Bilateral 4-/5  4-/5 bilateral  Hip adduction           Hip internal rotation           Hip external rotation           Knee flexion 5 5       Knee extension 5 5       Ankle dorsiflexion 5 5       Ankle plantarflexion 5 5       Ankle inversion           Core 2                                3 3 3    (Blank rows = not tested)   LUMBAR SPECIAL TESTS:  (-) SLR    FUNCTIONAL TESTS:  Plank: unable :  04/30/22: able to hold 10 sec: 05/14/22: 25 sec Squat: excessive trunk flexion, limited hip/knee flexion.  05/14/22: Ab crunch: can get scapula off mat   05/21/22: 33 seconds plank; squat: excessive trunk flexion   06/11/22 plank: 21 seconds   06/24/22: plank 28 seconds    GAIT: Distance walked: 10 ft  Assistive device utilized: None Level of assistance: Complete Independence Comments: WNL [facility] Adult PT Treatment:                                                DATE: 06/25/22 Therapeutic Exercise: Figure 4 bridge 2 x 10  SLR with posterior pelvic tilt x 10 each  90/90 hold x 30 sec Supine TA march x 20  Sideplank x 30 sec each  Sidelying hip circles x 10 each cw/ccw Quadruped arm extension 2 x 10  Seated TA march x 10  Sit to stand x 10  Wall squat x 10  Hip flexor stretch x 30 sec  Reviewed and updated HEP  Therapeutic Activity: Re-assessment to determine overall progress.    [facility] Adult PT Treatment:  DATE: 06/18/22 Therapeutic Exercise: Elliptical level 3 x 5 minutes  Dead bug, 90/90 leg extension, 90/90 heel taps all attempted d/c due to pain Supine bent knee fallout 2 x 10  Supine TA march 2 x 10  Figure 4 hip bridge 1 x 10 each Plank 30 sec, 25 sec, 9 sec Deadlift 6# dumbbells 2 x 10      [facility] Adult PT Treatment:                                                DATE: 06/11/22 Therapeutic Exercise: Elliptical level 3 x 5 minutes  Seated TA march 2 x 10  Seated march with opposite arm reaching 2 x 10  Lunge hip flexor stretch 2 x 30 sec  Plank 5 trials to fatigue 15 seconds- 21 seconds  Fire hydrant 2 x 10  Quadruped leg extension 2 x 10  Updated HEP        PATIENT EDUCATION:  Education details: see treatment; d/c education Person educated: Patient Education method: Explanation, handout, cues Education comprehension: verbalized understanding, returned demo   HOME EXERCISE PROGRAM: Access Code: [email or web address] URL:  [email or web address] Date: 04/08/2022 Prepared by: Letitia Libra   Exercises - Supine Bridge  - 2 x daily - 7 x weekly - 2 sets - 10 reps - Sidelying Hip Abduction  - 2 x daily - 7 x weekly - 2 sets - 10 reps - Supine Active Straight Leg Raise  - 2 x daily - 7 x weekly - 2 sets - 10 reps - Supine Posterior Pelvic Tilt  - 2 x daily - 7 x weekly - 2 sets - 10 reps Added 04/16/22 - Supine 90/90 Abdominal Bracing  - 2 x daily - 7 x weekly - 1 sets - 10 reps - 5 hold - Bird Dog  - 2 x daily - 7 x weekly - 1 sets - 10 reps - 5 hold Added 04/30/22 - Standard Plank  - 1 x daily - 7 x weekly - 1 sets - 5 reps - 10-20 hold - Sit to stand with control  - 1 x daily - 7 x weekly - 2 sets - 10 reps   ASSESSMENT:   CLINICAL IMPRESSION: Patient reports an overall improvement in her back pain since the start of care. Patient demonstrates no change in objective strength measurements compared to previous re-evaluation and reports minimal adherence to HEP. Due to lack of progress she is appropriate for discharge with recommendation to consistently complete her HEP to assist in further reducing her back pain with patient verbalizing understanding. She demonstrates independence with home program and is appropriate for discharge at this time.    OBJECTIVE IMPAIRMENTS: decreased activity tolerance, decreased endurance, decreased knowledge of condition, difficulty walking, decreased strength, increased fascial restrictions, impaired flexibility, improper body mechanics, postural dysfunction, and pain.    ACTIVITY LIMITATIONS: carrying, lifting, bending, sitting, standing, squatting, and locomotion level   PARTICIPATION LIMITATIONS: meal prep, cleaning, laundry, shopping, and school   PERSONAL FACTORS: Age, Fitness, Profession, and Time since onset of injury/illness/exacerbation are also affecting patient's functional outcome.    REHAB POTENTIAL: Good   CLINICAL DECISION MAKING:  Stable/uncomplicated   EVALUATION COMPLEXITY: Low     GOALS: Goals reviewed with patient? Yes   SHORT TERM GOALS: Target date: 04/29/22   Patient will be independent and compliant with initial HEP.  Baseline: issued at eval  Goal status: GOAL MET    2.  Patient will maintain plank on elbows with proper form for at least 10 seconds to signify improvements in lumbopelvic stability.  Baseline: unable  Status: can hold 10 sec 04/30/22 Goal status: GOAL MET   3.  Patient will demonstrate proper bending/lifting mechanics to reduce stress on her back with lifting her daughter.  Baseline: aberrant mechanics  Status: normal squat mechanics 05/28/22 Goal status: met     LONG TERM GOALS: Target date: 05/20/22   Patient will demonstrate at least 4+/5 bilateral hip strength to improve stability about the chain with standing activity.  Baseline: see above  Goal status: not met   2.  Patient will be able to lift at least 20 lbs with proper form without an increase in back pain.  Baseline: aberrant mechanics, increased pain  Status: 05/14/22: min increased pain with lifting 5# from floor Status: 05/21/22: progressing lifting as appropriate Status: 06/25/22: cues for lifting of 5 lbs  Goal status: not met    3.  Patient will report pain as </= 4/10 to reduce her current functional limitations.  Baseline: see above  Status: 05/14/22: pain level 5/10 Status: 05/21/22: 9/10 at worst  Status: 06/25/22: 5/10  Goal status: nearly met    PLAN:   PT FREQUENCY: n/a   PT DURATION: n/a   PLANNED INTERVENTIONS: Therapeutic exercises, Therapeutic activity, Neuromuscular re-education, Balance training, Patient/Family education, Self Care, Dry Needling, Cryotherapy, Moist heat, Manual therapy, and Re-evaluation.   PLAN FOR NEXT SESSION: n/a   Gwendolyn Grant, PT, DPT, ATC 06/25/22 10:21 AM

## 2022-06-25 ENCOUNTER — Ambulatory Visit: Payer: Medicaid Other

## 2022-06-25 DIAGNOSIS — M6281 Muscle weakness (generalized): Secondary | ICD-10-CM

## 2022-06-25 DIAGNOSIS — M5459 Other low back pain: Secondary | ICD-10-CM | POA: Diagnosis not present

## 2022-06-25 DIAGNOSIS — M546 Pain in thoracic spine: Secondary | ICD-10-CM

## 2022-06-25 DIAGNOSIS — R293 Abnormal posture: Secondary | ICD-10-CM

## 2023-02-03 ENCOUNTER — Encounter: Payer: Self-pay | Admitting: Internal Medicine

## 2023-02-03 ENCOUNTER — Ambulatory Visit (INDEPENDENT_AMBULATORY_CARE_PROVIDER_SITE_OTHER): Payer: Medicaid Other | Admitting: Internal Medicine

## 2023-02-03 VITALS — BP 110/70 | HR 68 | Temp 97.7°F | Ht 65.75 in | Wt 160.4 lb

## 2023-02-03 DIAGNOSIS — G8929 Other chronic pain: Secondary | ICD-10-CM | POA: Diagnosis not present

## 2023-02-03 DIAGNOSIS — R55 Syncope and collapse: Secondary | ICD-10-CM

## 2023-02-03 DIAGNOSIS — G43809 Other migraine, not intractable, without status migrainosus: Secondary | ICD-10-CM | POA: Diagnosis not present

## 2023-02-03 DIAGNOSIS — G43909 Migraine, unspecified, not intractable, without status migrainosus: Secondary | ICD-10-CM | POA: Insufficient documentation

## 2023-02-03 DIAGNOSIS — M545 Low back pain, unspecified: Secondary | ICD-10-CM | POA: Diagnosis not present

## 2023-02-03 LAB — CBC WITH DIFFERENTIAL/PLATELET
Basophils Absolute: 0 10*3/uL (ref 0.0–0.1)
Basophils Relative: 0.6 % (ref 0.0–3.0)
Eosinophils Absolute: 0.4 10*3/uL (ref 0.0–0.7)
Eosinophils Relative: 5 % (ref 0.0–5.0)
HCT: 34.7 % — ABNORMAL LOW (ref 36.0–46.0)
Hemoglobin: 11.6 g/dL — ABNORMAL LOW (ref 12.0–15.0)
Lymphocytes Relative: 34.8 % (ref 12.0–46.0)
Lymphs Abs: 2.8 10*3/uL (ref 0.7–4.0)
MCHC: 33.5 g/dL (ref 30.0–36.0)
MCV: 81.9 fl (ref 78.0–100.0)
Monocytes Absolute: 0.8 10*3/uL (ref 0.1–1.0)
Monocytes Relative: 9.5 % (ref 3.0–12.0)
Neutro Abs: 4 10*3/uL (ref 1.4–7.7)
Neutrophils Relative %: 50.1 % (ref 43.0–77.0)
Platelets: 298 10*3/uL (ref 150.0–400.0)
RBC: 4.24 Mil/uL (ref 3.87–5.11)
RDW: 13.3 % (ref 11.5–15.5)
WBC: 8 10*3/uL (ref 4.0–10.5)

## 2023-02-03 LAB — LIPID PANEL
Cholesterol: 114 mg/dL (ref 0–200)
HDL: 53.4 mg/dL (ref >39.00)
LDL Cholesterol: 47 mg/dL (ref 0–99)
NonHDL: 60.33
Total CHOL/HDL Ratio: 2
Triglycerides: 65 mg/dL (ref 0.0–149.0)
VLDL: 13 mg/dL (ref 0.0–40.0)

## 2023-02-03 LAB — COMPREHENSIVE METABOLIC PANEL
ALT: 11 U/L (ref 0–35)
AST: 18 U/L (ref 0–37)
Albumin: 4.1 g/dL (ref 3.5–5.2)
Alkaline Phosphatase: 87 U/L (ref 39–117)
BUN: 15 mg/dL (ref 6–23)
CO2: 27 meq/L (ref 19–32)
Calcium: 9.3 mg/dL (ref 8.4–10.5)
Chloride: 105 meq/L (ref 96–112)
Creatinine, Ser: 0.56 mg/dL (ref 0.40–1.20)
GFR: 127.68 mL/min (ref >60.00)
Glucose, Bld: 104 mg/dL — ABNORMAL HIGH (ref 70–99)
Potassium: 3.5 meq/L (ref 3.5–5.1)
Sodium: 137 meq/L (ref 135–145)
Total Bilirubin: 0.6 mg/dL (ref 0.2–1.2)
Total Protein: 7.8 g/dL (ref 6.0–8.3)

## 2023-02-03 LAB — TSH: TSH: 1.23 u[IU]/mL (ref 0.35–5.50)

## 2023-02-03 NOTE — Patient Instructions (Addendum)
VISIT SUMMARY:  During your visit, we discussed your recent episodes of passing out, headaches, and back pain. We are taking your symptoms seriously and have planned several tests to understand the cause of these issues.  YOUR PLAN:  -PASSING OUT EPISODES: You've had two episodes of passing out recently. This is known as syncope, which can be caused by various factors. We're referring you to a heart specialist (cardiologist) and ordering a Holter monitor, which is a device that records your heart's activity. We're also ordering an MRI of your brain to check for any issues there. Additionally, we'll do some blood tests to check for anemia or vitamin deficiencies, and a urine test, pregnancy test, and drug screen.  -HEADACHES: You've been experiencing headaches along with dizziness. We're ordering an MRI of your brain to look for any potential causes of these headaches.  -BACK PAIN: You've reported pain in your upper and lower back. If the pain continues, we may consider an MRI of your thoracic spine (the part of your spine that corresponds to your chest area) during your next visit.  INSTRUCTIONS:  Please schedule your follow-up appointment in 2 weeks. At that time, we'll review the results of your tests and discuss the next steps for your care.  Welcome aboard!   Today's visit was a valuable first step in understanding your health and starting your personalized care journey. We discussed your medical history and medications in detail. Given the extensive information, we prioritized addressing your most pressing concerns.  We understood those concerns to be:  Establish Care, Back Pain (Lower and upper back ), Headache (Daily worsen with light  sometimes one sided with severe pain in eye ), and Loss of Consciousness (2 weeks ago hit head on stairs and in June passed out )   Building a Complete Picture  To create the most effective care plan possible, we may need additional information from  previous providers. We encouraged you to gather any relevant medical records for your next visit. This will help [place] build a more complete picture and develop a personalized plan together. In the meantime, we'll address your immediate concerns and provide resources to help you manage all of your medical issues.  We encourage you to use MyChart to review these efforts, and to help [place] find and correct any omissions or errors in your medical chart.  Managing Your Health Over Time  Managing every aspect of your health in a single visit isn't always feasible, but that's okay.  We addressed your most pressing concerns today and charted a course for future care. Acute conditions or preventive care measures may require further attention.  We encourage you to schedule a follow-up visit at your earliest convenience to discuss any unresolved issues.  We strongly encourage participation in annual preventive care visits to help [place] develop a more thorough understanding of your health and to help you maintain optimal wellness - please inquire about scheduling your next one with [place] at your earliest convenience.  Your Satisfaction Matters  It was a pleasure seeing you today!  Your health and satisfaction will always be my top priorities. If you believe your experience today was worthy of a 5-star rating, I'd be grateful for your feedback!  Lula Olszewski, MD   Next Steps  Schedule Follow-Up:  We recommend a follow-up appointment in Return in about 2 weeks (around 02/17/2023). If your condition worsens before then, please call us or seek emergency care. Preventive Care:  Don't forget to schedule your  annual preventive care visit!  This important checkup is typically covered by insurance and helps identify potential health issues early.  Typically its 100% insurance covered with no co-pay and helps to get surveillance labwork paid for through your insurance provider.  Sometimes it even lowers your insurance premiums to  participate. Medical Information Release:  For any relevant medical information we don't have, please sign a release form so we can obtain it for your records. Lab & X-ray Appointments:  Scheduled any incomplete lab tests today or call us to schedule.  X-Rays can be done without an appointment at [facility] at [facility] ([address], [address]), M-F 8:30am-noon or 1pm-5pm.  Just tell them you're there for X-rays ordered by Dr. Jon Billings.  We'll receive the results and contact you by phone or MyChart to discuss next steps.  Bring to Your Next Appointment  Medications: Please bring all your medication bottles to your next appointment to ensure we have an accurate record of your prescriptions. Health Diaries: If you're monitoring any health conditions at home, keeping a diary of your readings can be very helpful for discussions at your next appointment.  Reviewing Your Records  Please Review this early draft of your clinical notes below and the final encounter summary tomorrow on MyChart after its been completed.   Syncope, unspecified syncope type -     Lipid panel -     TSH -     Comprehensive metabolic panel -     CBC with Differential/Platelet -     Troponin I - -     Ambulatory referral to Cardiology -     Ambulatory referral to Neurology -     EKG 12-Lead -     DRUG MONITORING, PANEL 8 WITH CONFIRMATION, URINE -     Urinalysis; Future -     Pregnancy, urine; Future  Other migraine without status migrainosus, not intractable -     MR BRAIN W WO CONTRAST; Future -     CARDIAC EVENT MONITOR; Future -     Ambulatory referral to Neurology  Chronic low back pain, unspecified back pain laterality, unspecified whether sciatica present     Getting Answers and Following Up  Simple Questions & Concerns: For quick questions or basic follow-up after your visit, reach [place] at [phone] or MyChart messaging. Complex Concerns: If your concern is more complex, scheduling an  appointment might be best. Discuss this with the staff to find the most suitable option. Lab & Imaging Results: We'll contact you directly if results are abnormal or you don't use MyChart. Most normal results will be on MyChart within 2-3 business days, with a review message from Dr. Jon Billings. Haven't heard back in 2 weeks? Need results sooner? Contact us at [phone]. Referrals: Our referral coordinator will manage specialist referrals. The specialist's office should contact you within 2 weeks to schedule an appointment. Call us if you haven't heard from them after 2 weeks.  Staying Connected  MyChart: Activate your MyChart for the fastest way to access results and message [place]. See the last page of this paperwork for instructions.  Billing  X-ray & Lab Orders: These are billed by separate companies. Contact the invoicing company directly for questions or concerns. Visit Charges: Discuss any billing inquiries with our administrative services team.  Feedback & Satisfaction  Share Your Experience: We strive for your satisfaction! If you have any complaints, please let Dr. Jon Billings know directly or contact our Practice Administrators, Edwena Felty or  Hasna Boutaib, by asking at the front desk.  Scheduling Tips  Shorter Wait Times: 8 am and 1 pm appointments often have the quickest wait times. Longer Appointments: If you need more time during your visit, talk to the front desk. Due to insurance regulations, multiple back-to-back appointments might be necessary.

## 2023-02-03 NOTE — Assessment & Plan Note (Signed)
She reports headaches associated with dizziness. We will order an MRI of the brain to evaluate for potential causes.

## 2023-02-03 NOTE — Assessment & Plan Note (Signed)
She experienced two episodes of syncope in the past two weeks, with one episode occurring on the stairs. She reported no seizures, convulsions, recent cessation of alcohol or benzodiazepine use, or new medications but did experience lightheadedness and vertigo. We will refer her to cardiology for further evaluation, order a Holter monitor to capture cardiac activity during potential future syncopal episodes, and an MRI of the brain to rule out central causes of syncope. Blood tests will check for anemia or vitamin deficiencies, and we will also order urine analysis, a pregnancy test, and a drug screen.

## 2023-02-03 NOTE — Progress Notes (Signed)
[facility]  Phone: [phone]  New patient visit  Visit Date: 02/03/2023 Patient: Jacqueline Miranda   DOB: 2000-07-07   22 y.o. Female  MRN: [id number] Patient Care Team: Lula Olszewski, MD as PCP - General (Internal Medicine) Today's Health Care Provider: Lula Olszewski, MD   Assessment & Plan Syncope, unspecified syncope type She experienced two episodes of syncope in the past two weeks, with one episode occurring on the stairs. She reported no seizures, convulsions, recent cessation of alcohol or benzodiazepine use, or new medications but did experience lightheadedness and vertigo. We will refer her to cardiology for further evaluation, order a Holter monitor to capture cardiac activity during potential future syncopal episodes, and an MRI of the brain to rule out central causes of syncope. Blood tests will check for anemia or vitamin deficiencies, and we will also order urine analysis, a pregnancy test, and a drug screen. Other migraine without status migrainosus, not intractable She reports headaches associated with dizziness. We will order an MRI of the brain to evaluate for potential causes. Chronic low back pain, unspecified back pain laterality, unspecified whether sciatica present She reports both upper and lower back pain, with the upper back pain being more bothersome. We will consider an MRI of the thoracic spine during the next visit if the pain persists.  She failed extensive physical therapy efforts so MRI is likely next step but want to get MRI brain first and not get denied by insurance, given the more fulminant syncope.  We will follow up in 2 weeks to review the results of testing and discuss further management.      ED Discharge Orders          Ordered    Lipid panel       Comments: [place]    02/03/23 1115    TSH        02/03/23 1115    Comprehensive metabolic panel        02/03/23 1115    CBC with Differential/Platelet        02/03/23 1115     Troponin I        02/03/23 1115    Ambulatory referral to Cardiology        02/03/23 1115    MR Brain W Wo Contrast        02/03/23 1115    Cardiac event monitor        02/03/23 1115    Urinalysis  Status:  Canceled        02/03/23 1115    ToxASSURE Select 13 (MW), Urine  Status:  Canceled       Comments: Current Outpatient Medications:   acetaminophen (TYLENOL) 325 MG tablet, Take 2 tablets (650 mg total) by mouth every 4 (four) hours as needed (for pain scale < 4)., Disp: 100 tablet, Rfl: 0  Cholecalciferol (VITAMIN D-3 PO), Take 1 capsule by mouth daily., Disp: , Rfl:   ibuprofen (ADVIL) 600 MG tablet, Take 1 tablet (600 mg total) by mouth every 6 (six) hours., Disp: 30 tablet, Rfl: 0    02/03/23 1115    Pregnancy, urine  Status:  Canceled        02/03/23 1115    Ambulatory referral to Neurology        02/03/23 1115    EKG 12-Lead        02/03/23 1115    DRUG MONITORING, PANEL 8 WITH CONFIRMATION, URINE        02/03/23 1129  Urinalysis        02/03/23 1158    Pregnancy, urine        02/03/23 1158          Diagnoses and all orders for this visit: Syncope, unspecified syncope type -     Lipid panel -     TSH -     Comprehensive metabolic panel -     CBC with Differential/Platelet -     Troponin I -     Ambulatory referral to Cardiology -     Ambulatory referral to Neurology -     EKG 12-Lead -     DRUG MONITORING, PANEL 8 WITH CONFIRMATION, URINE -     Urinalysis; Future -     Pregnancy, urine; Future Other migraine without status migrainosus, not intractable -     MR Brain W Wo Contrast; Future -     Cardiac event monitor; Future -     Ambulatory referral to Neurology  @Recommended  follow up: No follow-ups on file. Future Appointments  Date Time Provider Department Center  02/18/2023  9:00 AM Lula Olszewski, MD [facility]      Subjective  22 y.o. female who has Toothache; Leg pain, bilateral; Blurred vision; Anemia in pregnancy; Positive RPR test;  Acute reaction to situational stress; Back pain; and Stomach pain on their problem list. Her reasons/main concerns/chief complaints for today's office visit are Establish Care, Back Pain (Lower and upper back ), Headache (Daily worsen with light  sometimes one sided with severe pain in eye ), and Loss of Consciousness (2 weeks ago hit head on stairs and in June passed out )   ------------------------------------------------------------------------------------------------------------------------ AI-Extracted: Discussed the use of AI scribe software for clinical note transcription with the patient, who gave verbal consent to proceed.  History of Present Illness   The patient, with a history of headaches and dizziness, presents with complaints of recurrent episodes of passing out, most notably on two occasions while on the stairs. The patient describes two types of dizziness: lightheadedness and vertigo. The patient reports experiencing both types, with the former being more prevalent. The patient denies any memory of the events leading up to the passing out episodes, but recalls waking up on the floor. There is no history of seizures, convulsions, or recent cessation of alcohol or benzodiazepine use.  The patient also reports a history of headaches, which are sometimes accompanied by a sensation of internal shaking. The patient denies any current headache but reports a feeling of impending onset. The patient has a history of iron and vitamin D supplementation, but denies any recent changes in medication.  The patient also reports upper back pain, which is currently more bothersome than the occasional lower back pain. The patient has previously attended physical therapy, but reports no significant improvement from the sessions. The patient has a history of attending gym regularly, but has recently stopped due to the onset of these symptoms.  The patient denies any recent changes in lifestyle or habits that  could explain the onset of these symptoms. The patient has a history of pregnancy two years ago, but denies any similar symptoms during or after the pregnancy. The patient denies any known allergies or implanted devices in the body.       ------------------------------------------------------------------------------------------------------------------------ She has a past medical history of Anemia. Problem list overviews that were updated at today's visit: No problems updated. Current Outpatient Medications on File Prior to Visit  Medication Sig   acetaminophen (TYLENOL) 325  MG tablet Take 2 tablets (650 mg total) by mouth every 4 (four) hours as needed (for pain scale < 4).   Cholecalciferol (VITAMIN D-3 PO) Take 1 capsule by mouth daily.   ibuprofen (ADVIL) 600 MG tablet Take 1 tablet (600 mg total) by mouth every 6 (six) hours.   No current facility-administered medications on file prior to visit.   Medications Discontinued During This Encounter  Medication Reason   ferrous sulfate 325 (65 FE) MG tablet    Saline 0.9 % AERS No longer needed (for PRN medications)   witch hazel-glycerin (TUCKS) pad No longer needed (for PRN medications)   benzocaine-Menthol (DERMOPLAST) 20-0.5 % AERO No longer needed (for PRN medications)          EKG: sinus rhythm with rate 60's, normal axis, normal intervals, no hypertrophy, peaked st or t wave changes diffuse    Problems: has Toothache; Leg pain, bilateral; Blurred vision; Anemia in pregnancy; Positive RPR test; Acute reaction to situational stress; Back pain; and Stomach pain on their problem list. Current Meds  Medication Sig   acetaminophen (TYLENOL) 325 MG tablet Take 2 tablets (650 mg total) by mouth every 4 (four) hours as needed (for pain scale < 4).   Cholecalciferol (VITAMIN D-3 PO) Take 1 capsule by mouth daily.   ibuprofen (ADVIL) 600 MG tablet Take 1 tablet (600 mg total) by mouth every 6 (six) hours.   Allergies:  No Known  Allergies Past Medical History:  has a past medical history of Anemia. Past Surgical History:   has a past surgical history that includes Tonsillectomy. Social History:   reports that she has never smoked. She has never used smokeless tobacco. She reports that she does not drink alcohol and does not use drugs. Family History:  family history includes Asthma in her mother; Hepatitis B in her father. Depression Screen and Health Maintenance:    03/29/2022    1:46 PM 05/19/2021   10:03 AM 09/22/2020   11:08 AM 09/11/2020    9:13 AM  PHQ 2/9 Scores  PHQ - 2 Score 0 0 1 3  PHQ- 9 Score  4 2 4    Health Maintenance  Topic Date Due   HPV VACCINES (1 - 3-dose series) Never done   COVID-19 Vaccine (4 - 2023-24 season) 05/31/2023 (Originally 01/30/2023)   INFLUENZA VACCINE  08/29/2023 (Originally 12/30/2022)   PAP-Cervical Cytology Screening  06/27/2023   PAP SMEAR-Modifier  06/27/2023   DTaP/Tdap/Td (2 - Td or Tdap) 07/08/2030   Hepatitis C Screening  Completed   HIV Screening  Completed   Immunization History  Administered Date(s) Administered   Hepatitis B 08/15/2019, 10/07/2019, 02/10/2020   IPV 02/10/2020   Influenza,inj,Quad PF,6+ Mos 03/15/2022   Influenza-Unspecified 03/06/2020   MMR 10/07/2019, 11/18/2019   PFIZER(Purple Top)SARS-COV-2 Vaccination 03/06/2020, 04/07/2020, 09/11/2020   Tdap 07/08/2020     Objective   Physical ExamBP 110/70   Pulse 68   Temp 97.7 F (36.5 C) (Temporal)   Ht 5' 5.75" (1.67 m)   Wt 160 lb 6.4 oz (72.8 kg)   LMP 01/13/2023   SpO2 99%   BMI 26.09 kg/m  Wt Readings from Last 10 Encounters:  02/03/23 160 lb 6.4 oz (72.8 kg)  03/29/22 160 lb 2 oz (72.6 kg)  05/19/21 153 lb 6.4 oz (69.6 kg)  09/22/20 145 lb 9.6 oz (66 kg)  09/11/20 144 lb (65.3 kg)  09/04/20 142 lb 9.6 oz (64.7 kg)  08/26/20 145 lb (65.8 kg)  08/05/20 142 lb  9.6 oz (64.7 kg)  07/24/20 144 lb 6.4 oz (65.5 kg)  07/08/20 140 lb (63.5 kg)  Vital signs reviewed.  Nursing notes  reviewed. Weight trend reviewed. Abnormalities and problem-specific physical exam findings:  no dizziness, minimal english, mostly arabic General Appearance:  Well developed, well nourished, well-groomed, healthy-appearing female with Body mass index is 26.09 kg/m. No acute distress appreciable.   Skin: Clear and well-hydrated. Pulmonary:  Normal work of breathing at rest, no respiratory distress apparent. SpO2: 99 %  Musculoskeletal: She demonstrates smooth and coordinated movements throughout all major joints.All extremities are intact.  Neurological:  Awake, alert, oriented, and engaged.  No obvious focal neurological deficits or cognitive impairments.  Sensorium seems unclouded.  Psychiatric:  Appropriate mood, pleasant and cooperative demeanor, cheerful and engaged during the exam  Reviewed Results & Data Results   LABS Hb: 12.5 (2023)  DIAGNOSTIC EKG: Normal (02/03/2023)        No results found for any visits on 02/03/23.  No visits with results within 1 Year(s) from this visit.  Latest known visit with results is:  Office Visit on 05/19/2021  Component Date Value   WBC 05/19/2021 7.0    RBC 05/19/2021 4.38    Hemoglobin 05/19/2021 12.5    Hematocrit 05/19/2021 36.8    MCV 05/19/2021 84    MCH 05/19/2021 28.5    MCHC 05/19/2021 34.0    RDW 05/19/2021 11.9    Platelets 05/19/2021 279     Additional notes: Initial Appointment Goals:  This initial visit focused on establishing a foundation for the patient's care. We collaboratively reviewed her medical history and medications in detail, updating the chart as shown in the encounter. Given the extensive information, we prioritized addressing her most pressing concerns, which she reported were: Establish Care, Back Pain (Lower and upper back ), Headache (Daily worsen with light  sometimes one sided with severe pain in eye ), and Loss of Consciousness (2 weeks ago hit head on stairs and in June passed out )  While the  complexity of the patient's medical picture may necessitate further evaluation in subsequent visits, we were able to develop a preliminary care plan together. To expedite a comprehensive plan at the next visit, we encouraged the patient to gather relevant medical records from previous providers. This collaborative approach will ensure a more complete understanding of the patient's health and inform the development of a personalized care plan. We look forward to continuing the conversation and working together with the patient on achieving her health goals.   Collaborative Documentation:  Today's encounter utilized real-time, dynamic patient engagement.  Patients actively participate by directly reviewing and assisting in updating their medical records through a shared screen. This transparency empowers patients to visually confirm chart updates made by the healthcare provider.  This collaborative approach facilitates problem management as we jointly update the problem list, problem overview, and assessment/plan. Ultimately, this process enhances chart accuracy and completeness, fostering shared decision-making, patient education, and informed consent for tests and treatments.  Collaborative Treatment Planning:  Treatment plans were discussed and reviewed in detail.  Explained medication safety and potential side effects.  Encouraged participation and answered all patient questions, confirming understanding and comfort with the plan. Encouraged patient to contact our office if they have any questions or concerns. Agreed on patient returning to office if symptoms worsen, persist, or new symptoms develop. Discussed precautions in case of needing to visit the Emergency Department.  ----------------------------------------------------- Lula Olszewski, MD  02/03/2023 12:58 PM  [facility]:  [phone]

## 2023-02-03 NOTE — Assessment & Plan Note (Signed)
She reports both upper and lower back pain, with the upper back pain being more bothersome. We will consider an MRI of the thoracic spine during the next visit if the pain persists.  She failed extensive physical therapy efforts so MRI is likely next step but want to get MRI brain first and not get denied by insurance, given the more fulminant syncope.

## 2023-02-04 LAB — DRUG MONITORING, PANEL 8 WITH CONFIRMATION, URINE
6 Acetylmorphine: NEGATIVE ng/mL (ref <10)
Alcohol Metabolites: NEGATIVE ng/mL (ref <500)
Amphetamines: NEGATIVE ng/mL (ref <500)
Benzodiazepines: NEGATIVE ng/mL (ref <100)
Buprenorphine, Urine: NEGATIVE ng/mL (ref <5)
Cocaine Metabolite: NEGATIVE ng/mL (ref <150)
Creatinine: 117.8 mg/dL (ref >=20.0)
MDMA: NEGATIVE ng/mL (ref <500)
Marijuana Metabolite: NEGATIVE ng/mL (ref <20)
Opiates: NEGATIVE ng/mL (ref <100)
Oxidant: NEGATIVE ug/mL (ref <200)
Oxycodone: NEGATIVE ng/mL (ref <100)
pH: 7.8 (ref 4.5–9.0)

## 2023-02-04 LAB — URINALYSIS
Bilirubin Urine: NEGATIVE
Glucose, UA: NEGATIVE
Hgb urine dipstick: NEGATIVE
Ketones, ur: NEGATIVE
Leukocytes,Ua: NEGATIVE
Nitrite: NEGATIVE
Protein, ur: NEGATIVE
Specific Gravity, Urine: 1.023 (ref 1.001–1.035)
pH: 7.5 (ref 5.0–8.0)

## 2023-02-04 LAB — DM TEMPLATE

## 2023-02-04 LAB — TROPONIN I: Troponin I: <3 ng/L (ref <=47)

## 2023-02-04 LAB — PREGNANCY, URINE: Preg Test, Ur: NEGATIVE

## 2023-02-06 ENCOUNTER — Encounter: Payer: Self-pay | Admitting: Internal Medicine

## 2023-02-06 DIAGNOSIS — D509 Iron deficiency anemia, unspecified: Secondary | ICD-10-CM | POA: Insufficient documentation

## 2023-02-06 DIAGNOSIS — R7301 Impaired fasting glucose: Secondary | ICD-10-CM | POA: Insufficient documentation

## 2023-02-06 DIAGNOSIS — D649 Anemia, unspecified: Secondary | ICD-10-CM | POA: Insufficient documentation

## 2023-02-06 NOTE — Progress Notes (Signed)
Disregard and close this message if patient has already reviewed their MyChart notification for these results.  - Test Results: Mild anemia and slightly elevated blood sugar detected. - Actions Required:   1. Ensure patient completes cardiac monitor test and brain MRI.   2. Verify cardiology and neurology appointments are scheduled.   3. Emphasize importance of hydration and balanced diet. - Appointments: MyChart encounter in 2 weeks, in-person follow-up in 6-8 weeks. - Common Question: "No, these results don't confirm diabetes, but we'll monitor your blood sugar closely."

## 2023-02-15 ENCOUNTER — Other Ambulatory Visit: Payer: Medicaid Other

## 2023-02-15 ENCOUNTER — Ambulatory Visit
Admission: RE | Admit: 2023-02-15 | Discharge: 2023-02-15 | Disposition: A | Discharge status: Home / Self Care | Payer: Medicaid Other | Source: Ambulatory Visit | Attending: Internal Medicine | Admitting: Internal Medicine

## 2023-02-15 DIAGNOSIS — G43809 Other migraine, not intractable, without status migrainosus: Secondary | ICD-10-CM

## 2023-02-15 MED ORDER — GADOPICLENOL 0.5 MMOL/ML IV SOLN
8.0000 mL | Freq: Once | INTRAVENOUS | Status: AC | PRN
  Administered 2023-02-15: 8 mL via INTRAVENOUS

## 2023-02-16 ENCOUNTER — Encounter: Payer: Self-pay | Admitting: Neurology

## 2023-02-18 ENCOUNTER — Ambulatory Visit (INDEPENDENT_AMBULATORY_CARE_PROVIDER_SITE_OTHER): Payer: Medicaid Other | Admitting: Internal Medicine

## 2023-02-18 ENCOUNTER — Ambulatory Visit (INDEPENDENT_AMBULATORY_CARE_PROVIDER_SITE_OTHER)
Admission: RE | Admit: 2023-02-18 | Discharge: 2023-02-18 | Disposition: A | Discharge status: Home / Self Care | Payer: Medicaid Other | Source: Ambulatory Visit | Attending: Internal Medicine

## 2023-02-18 ENCOUNTER — Encounter: Payer: Self-pay | Admitting: Internal Medicine

## 2023-02-18 VITALS — BP 102/67 | HR 64 | Temp 97.9°F | Resp 16 | Ht 66.0 in | Wt 153.4 lb

## 2023-02-18 DIAGNOSIS — M545 Low back pain, unspecified: Secondary | ICD-10-CM

## 2023-02-18 DIAGNOSIS — R7301 Impaired fasting glucose: Secondary | ICD-10-CM

## 2023-02-18 DIAGNOSIS — R55 Syncope and collapse: Secondary | ICD-10-CM | POA: Diagnosis not present

## 2023-02-18 DIAGNOSIS — M542 Cervicalgia: Secondary | ICD-10-CM

## 2023-02-18 DIAGNOSIS — M546 Pain in thoracic spine: Secondary | ICD-10-CM

## 2023-02-18 DIAGNOSIS — G8929 Other chronic pain: Secondary | ICD-10-CM | POA: Diagnosis not present

## 2023-02-18 LAB — POCT GLYCOSYLATED HEMOGLOBIN (HGB A1C): Hemoglobin A1C: 5.5 % (ref 4.0–5.6)

## 2023-02-18 MED ORDER — CELECOXIB 200 MG PO CAPS
200.0000 mg | ORAL_CAPSULE | Freq: Two times a day (BID) | ORAL | 1 refills | Status: DC
Start: 2023-02-18 — End: 2023-11-21

## 2023-02-18 NOTE — Progress Notes (Unsigned)
[facility]: [phone]   -- Medical Office Visit --  Patient:  Jacqueline Miranda      Age: 22 y.o.       Sex:  female  Date:   02/18/2023 Patient Care Team: Lula Olszewski, MD as PCP - General (Internal Medicine) Today's Healthcare Provider: Lula Olszewski, MD      Assessment & Plan Bilateral low back pain, unspecified chronicity, unspecified whether sciatica present She has experienced lower back pain since delivery two years ago, accompanied by intermittent fevers. There were difficulties with epidural placement during delivery, raising concerns about possible infection or residual pain from the procedure. An MRI of the lower back will be ordered to rule out infection, and Celebrex is prescribed for pain management. Impaired fasting glucose Her A1c level is 5.5, indicating she is close to the prediabetes range but not diagnostic. She is advised to limit sugar intake and maintain a healthy diet. Syncope, unspecified syncope type She has experienced lower back pain since delivery two years ago, accompanied by intermittent fevers. There were difficulties with epidural placement during delivery, raising concerns about possible infection or residual pain from the procedure. An MRI of the lower back will be ordered to rule out infection, and Celebrex is prescribed for pain management.  She has a history of syncope since delivery without a clear cause. Despite normal thyroid, kidney, liver function, and cholesterol levels, mild anemia was noted, though it is not severe enough to explain the syncope. Monitoring will continue.  We attempted to review MRI brain which is yet to be read by radiology- its appears normal to me I reassured her. We reviewed recent labwork that didn't show a caus Chronic bilateral thoracic back pain She has experienced lower back pain since delivery two years ago, accompanied by intermittent fevers. There were difficulties with epidural placement  during delivery, raising concerns about possible infection or residual pain from the procedure. An MRI of the lower back will be ordered to rule out infection, and Celebrex is prescribed for pain management. Chronic neck pain   A follow-up in 3 weeks is scheduled to review MRI results and assess back pain.       Diagnoses and all orders for this visit: Bilateral low back pain, unspecified chronicity, unspecified whether sciatica present -     celecoxib (CELEBREX) 200 MG capsule; Take 1 capsule (200 mg total) by mouth 2 (two) times daily. -     DG Lumbar Spine 2-3 Views; Future -     DG Thoracic Spine W/Swimmers; Future -     DG Cervical Spine Complete; Future -     MR LUMBAR SPINE W WO CONTRAST; Future Impaired fasting glucose -     POCT HgB A1C  Recommended follow-up: No follow-ups on file. Future Appointments  Date Time Provider Department Center  03/04/2023  9:20 AM Lula Olszewski, MD [facility]  03/28/2023 10:30 AM Van Clines, MD [facility] None  04/05/2023  2:40 PM Lennette Bihari, MD [facility] None            Subjective   22 y.o. female who has Toothache; Leg pain, bilateral; Blurred vision; Positive RPR test; Acute reaction to situational stress; Back pain; Stomach pain; Syncope; Migraine; Anemia; and Impaired fasting glucose on their problem list. Her reasons/main concerns/chief complaints for today's office visit are Follow-up (2 week follow-up/Pain in lower back on last night)   ------------------------------------------------------------------------------------------------------------------------ AI-Extracted: Discussed the use of AI scribe software for clinical note transcription with  the patient, who gave verbal consent to proceed.  History of Present Illness   The patient, who had a recent delivery on March 2nd, 2022, presents with persistent lower back pain. The pain is localized to the lower back and does not radiate upwards or downwards. The patient reports  that the pain has been present since the delivery and has been increasing in intensity, reaching a peak of 8 on a scale of 10. The pain was so severe that it led to the patient lying down to alleviate it. The patient also reports intermittent fevers since the delivery, although these are rare.  The patient has a history of epidural administration during delivery, which was complicated by movement during the needle insertion, necessitating a second attempt. The patient suspects this may be the cause of the current back pain.  The patient also reports a recent increase in weight since the delivery, and there is a suspicion of diabetes, although this is yet to be confirmed. The patient has not reported any allergies to medications.  The patient has also experienced episodes of passing out, with the most recent episode occurring a week prior to the consultation. The cause of these episodes is currently unknown. The patient denies any significant changes in bowel habits or constipation.  The patient is not currently breastfeeding and has not done so for the past two years. The patient has not reported any significant changes in diet or lifestyle since the delivery.  The patient has undergone an MRI for the lower back pain, but the results are not yet available at the time of the consultation. The patient has also been prescribed Celebrex for pain management.      She has a past medical history of Anemia and Anemia in pregnancy (06/28/2020).  Problem list overviews that were updated at today's visit: Problem  Anemia in Pregnancy (Resolved)   Rx iron    Current Outpatient Medications on File Prior to Visit  Medication Sig   acetaminophen (TYLENOL) 325 MG tablet Take 2 tablets (650 mg total) by mouth every 4 (four) hours as needed (for pain scale < 4).   Cholecalciferol (VITAMIN D-3 PO) Take 1 capsule by mouth daily. (Patient not taking: Reported on 02/18/2023)   No current facility-administered  medications on file prior to visit.   Medications Discontinued During This Encounter  Medication Reason   ibuprofen (ADVIL) 600 MG tablet Completed Course     Objective   Physical Exam  BP 102/67   Pulse 64   Temp 97.9 F (36.6 C) (Temporal)   Resp 16   Ht 5\' 6"  (1.676 m)   Wt 153 lb 6 oz (69.6 kg)   LMP 02/13/2023 (Approximate)   SpO2 99%   Breastfeeding No   BMI 24.76 kg/m  Wt Readings from Last 10 Encounters:  02/18/23 153 lb 6 oz (69.6 kg)  02/03/23 160 lb 6.4 oz (72.8 kg)  03/29/22 160 lb 2 oz (72.6 kg)  05/19/21 153 lb 6.4 oz (69.6 kg)  09/22/20 145 lb 9.6 oz (66 kg)  09/11/20 144 lb (65.3 kg)  09/04/20 142 lb 9.6 oz (64.7 kg)  08/26/20 145 lb (65.8 kg)  08/05/20 142 lb 9.6 oz (64.7 kg)  07/24/20 144 lb 6.4 oz (65.5 kg)   Vital signs reviewed.  Nursing notes reviewed. Weight trend reviewed. Abnormalities and Problem-Specific physical exam findings:  lumbar paraspinal spasm. Translator is used she speaks very little english.  General Appearance:  No acute distress appreciable.   Well-groomed, healthy-appearing  female.  Well proportioned with no abnormal fat distribution.  Good muscle tone. Pulmonary:  Normal work of breathing at rest, no respiratory distress apparent. SpO2: 99 %  Musculoskeletal: All extremities are intact.  Neurological:  Awake, alert, oriented, and engaged.  No obvious focal neurological deficits or cognitive impairments.  Sensorium seems unclouded.   Speech is clear and coherent with logical content. Psychiatric:  Appropriate mood, pleasant and cooperative demeanor, thoughtful and engaged during the exam  Results   LABS Hemoglobin: mild anemia Urinalysis: normal Renal function: normal Hepatic function: normal Thyroid function: normal Cholesterol: normal Hemoglobin A1c: 5.5% (02/18/2023)        Results for orders placed or performed in visit on 02/18/23  POCT HgB A1C  Result Value Ref Range   Hemoglobin A1C 5.5 4.0 - 5.6 %    Office  Visit on 02/18/2023  Component Date Value   Hemoglobin A1C 02/18/2023 5.5   Office Visit on 02/03/2023  Component Date Value   Cholesterol 02/03/2023 114    Triglycerides 02/03/2023 65.0    HDL 02/03/2023 53.40    VLDL 02/03/2023 13.0    LDL Cholesterol 02/03/2023 47    Total CHOL/HDL Ratio 02/03/2023 2    NonHDL 02/03/2023 60.33    TSH 02/03/2023 1.23    Sodium 02/03/2023 137    Potassium 02/03/2023 3.5    Chloride 02/03/2023 105    CO2 02/03/2023 27    Glucose, Bld 02/03/2023 104 (H)    BUN 02/03/2023 15    Creatinine, Ser 02/03/2023 0.56    Total Bilirubin 02/03/2023 0.6    Alkaline Phosphatase 02/03/2023 87    AST 02/03/2023 18    ALT 02/03/2023 11    Total Protein 02/03/2023 7.8    Albumin 02/03/2023 4.1    GFR 02/03/2023 127.68    Calcium 02/03/2023 9.3    WBC 02/03/2023 8.0    RBC 02/03/2023 4.24    Hemoglobin 02/03/2023 11.6 (L)    HCT 02/03/2023 34.7 (L)    MCV 02/03/2023 81.9    MCHC 02/03/2023 33.5    RDW 02/03/2023 13.3    Platelets 02/03/2023 298.0    Neutrophils Relative % 02/03/2023 50.1    Lymphocytes Relative 02/03/2023 34.8    Monocytes Relative 02/03/2023 9.5    Eosinophils Relative 02/03/2023 5.0    Basophils Relative 02/03/2023 0.6    Neutro Abs 02/03/2023 4.0    Lymphs Abs 02/03/2023 2.8    Monocytes Absolute 02/03/2023 0.8    Eosinophils Absolute 02/03/2023 0.4    Basophils Absolute 02/03/2023 0.0    Troponin I 02/03/2023 <3    Alcohol Metabolites 02/03/2023 NEGATIVE    Amphetamines 02/03/2023 NEGATIVE    Benzodiazepines 02/03/2023 NEGATIVE    Buprenorphine, Urine 02/03/2023 NEGATIVE    Cocaine Metabolite 02/03/2023 NEGATIVE    6 Acetylmorphine 02/03/2023 NEGATIVE    Marijuana Metabolite 02/03/2023 NEGATIVE    MDMA 02/03/2023 NEGATIVE    Opiates 02/03/2023 NEGATIVE    Oxycodone 02/03/2023 NEGATIVE    Creatinine 02/03/2023 117.8    pH 02/03/2023 7.8    Oxidant 02/03/2023 NEGATIVE    Preg Test, Ur 02/03/2023 NEGATIVE    Color, Urine  02/03/2023 YELLOW    APPearance 02/03/2023 CLOUDY (A)    Specific Gravity, Urine 02/03/2023 1.023    pH 02/03/2023 7.5    Glucose, UA 02/03/2023 NEGATIVE    Bilirubin Urine 02/03/2023 NEGATIVE    Ketones, ur 02/03/2023 NEGATIVE    Hgb urine dipstick 02/03/2023 NEGATIVE    Protein, ur 02/03/2023 NEGATIVE  Nitrite 02/03/2023 NEGATIVE    Leukocytes,Ua 02/03/2023 NEGATIVE    Notes and Comments 02/03/2023     No image results found.   DG Cervical Spine Complete  Result Date: 02/18/2023 CLINICAL DATA:  Neck pain. EXAM: CERVICAL SPINE - COMPLETE 4+ VIEW COMPARISON:  None Available. FINDINGS: There is no evidence of cervical spine fracture or prevertebral soft tissue swelling. Alignment is normal. No other significant bone abnormalities are identified. IMPRESSION: Negative cervical spine radiographs. Electronically Signed   By: Sherian Rein M.D.   On: 02/18/2023 15:24   DG Thoracic Spine W/Swimmers  Result Date: 02/18/2023 CLINICAL DATA:  Mid back pain. EXAM: THORACIC SPINE - 3 VIEWS COMPARISON:  None Available. FINDINGS: There is no evidence of thoracic spine fracture. Scoliosis. No other significant bone abnormalities are identified. IMPRESSION: No acute fracture or dislocation. Scoliosis. Electronically Signed   By: Sherian Rein M.D.   On: 02/18/2023 15:24   DG Lumbar Spine 2-3 Views  Result Date: 02/18/2023 CLINICAL DATA:  Low back pain radiating to bilateral legs. EXAM: LUMBAR SPINE - 2-3 VIEW COMPARISON:  None Available. FINDINGS: There is no evidence of lumbar spine fracture. Mild curvature of spine. Intervertebral disc spaces are maintained. IMPRESSION: No acute fracture or dislocation. Mild curvature of spine. Electronically Signed   By: Sherian Rein M.D.   On: 02/18/2023 15:23    DG Cervical Spine Complete  Result Date: 02/18/2023 CLINICAL DATA:  Neck pain. EXAM: CERVICAL SPINE - COMPLETE 4+ VIEW COMPARISON:  None Available. FINDINGS: There is no evidence of cervical spine fracture  or prevertebral soft tissue swelling. Alignment is normal. No other significant bone abnormalities are identified. IMPRESSION: Negative cervical spine radiographs. Electronically Signed   By: Sherian Rein M.D.   On: 02/18/2023 15:24   DG Thoracic Spine W/Swimmers  Result Date: 02/18/2023 CLINICAL DATA:  Mid back pain. EXAM: THORACIC SPINE - 3 VIEWS COMPARISON:  None Available. FINDINGS: There is no evidence of thoracic spine fracture. Scoliosis. No other significant bone abnormalities are identified. IMPRESSION: No acute fracture or dislocation. Scoliosis. Electronically Signed   By: Sherian Rein M.D.   On: 02/18/2023 15:24   DG Lumbar Spine 2-3 Views  Result Date: 02/18/2023 CLINICAL DATA:  Low back pain radiating to bilateral legs. EXAM: LUMBAR SPINE - 2-3 VIEW COMPARISON:  None Available. FINDINGS: There is no evidence of lumbar spine fracture. Mild curvature of spine. Intervertebral disc spaces are maintained. IMPRESSION: No acute fracture or dislocation. Mild curvature of spine. Electronically Signed   By: Sherian Rein M.D.   On: 02/18/2023 15:23       Additional Info: This encounter employed real-time, collaborative documentation. The patient actively reviewed and updated their medical record on a shared screen, ensuring transparency and facilitating joint problem-solving for the problem list, overview, and plan. This approach promotes accurate, informed care. The treatment plan was discussed and reviewed in detail, including medication safety, potential side effects, and all patient questions. We confirmed understanding and comfort with the plan. Follow-up instructions were established, including contacting the office for any concerns, returning if symptoms worsen, persist, or new symptoms develop, and precautions for potential emergency department visits.

## 2023-02-18 NOTE — Assessment & Plan Note (Signed)
She has experienced lower back pain since delivery two years ago, accompanied by intermittent fevers. There were difficulties with epidural placement during delivery, raising concerns about possible infection or residual pain from the procedure. An MRI of the lower back will be ordered to rule out infection, and Celebrex is prescribed for pain management.  She has a history of syncope since delivery without a clear cause. Despite normal thyroid, kidney, liver function, and cholesterol levels, mild anemia was noted, though it is not severe enough to explain the syncope. Monitoring will continue.  We attempted to review MRI brain which is yet to be read by radiology- its appears normal to me I reassured her. We reviewed recent labwork that didn't show a caus

## 2023-02-18 NOTE — Assessment & Plan Note (Signed)
She has experienced lower back pain since delivery two years ago, accompanied by intermittent fevers. There were difficulties with epidural placement during delivery, raising concerns about possible infection or residual pain from the procedure. An MRI of the lower back will be ordered to rule out infection, and Celebrex is prescribed for pain management.

## 2023-02-18 NOTE — Patient Instructions (Addendum)
It was a pleasure seeing you today! Your health and satisfaction are our top priorities.  Jacqueline Hew, MD  VISIT SUMMARY:  During our visit, we discussed your persistent lower back pain that has been present since your delivery two years ago. We also talked about your episodes of passing out and your recent increase in weight. We suspect that the back pain might be related to the epidural you had during delivery, and we are waiting for the results of an MRI to confirm this. Your episodes of passing out are still unexplained, but we will continue to monitor this. Your recent weight gain has brought you close to the prediabetes range, so we advise you to limit your sugar intake and maintain a healthy diet.  YOUR PLAN:  -LOWER BACK PAIN: Your lower back pain might be related to the epidural you had during delivery. We are waiting for the results of an MRI to confirm this. In the meantime, you have been prescribed Celebrex to manage the pain.  -SYNCOPE: You have been experiencing episodes of passing out, but we have not yet found a clear cause. We will continue to monitor this.  -PREDIABETES: Your recent weight gain has brought you close to the prediabetes range. We advise you to limit your sugar intake and maintain a healthy diet to prevent the development of diabetes.  INSTRUCTIONS:  We have scheduled a follow-up appointment in 3 weeks to review the results of your MRI and assess your back pain. Please continue to take your prescribed medication, Celebrex, for pain management. Also, remember to limit your sugar intake and maintain a healthy diet to prevent the development of diabetes.   NEXT STEPS: [x]  Early Intervention: Schedule sooner appointment, call our on-call services, or go to emergency room if there is any significant Increase in pain or discomfort New or worsening symptoms Sudden or severe changes in your health [x]  Flexible Follow-Up: We recommend a 2-4 weeks for optimal routine  care. This allows for progress monitoring and treatment adjustments. [x]  Preventive Care: Schedule your annual preventive care visit! It's typically covered by insurance and helps identify potential health issues early. [x]  Lab & X-ray Appointments: Incomplete tests scheduled today, or call to schedule. X-rays: [facility] [place] (M-F, 8:30am-noon or 1pm-5pm).  Please go to our [facility] office to get your xrays done. You can walk in M-F between 8:30am- noon or 1pm - 5pm. Tell them you are there for xrays ordered by me. They will send me the results, then I will let you know the results with instructions.   Address: [address].  The Xray department is located in the basement.    [x]  Medical Information Release: Sign a release form at front desk to obtain relevant medical information we don't have.  MAKING THE MOST OF OUR FOCUSED 20 MINUTE APPOINTMENTS: [x]   Clearly state your top concerns at the beginning of the visit to focus our discussion [x]   If you anticipate you will need more time, please inform the front desk during scheduling - we can book multiple appointments in the same week. [x]   If you have transportation problems- use our convenient video appointments or ask about transportation support. [x]   We can get down to business faster if you use MyChart to update information before the visit and submit non-urgent questions before your visit. Thank you for taking the time to provide details through MyChart.  Let our nurse know and she can import this information into  your encounter documents.  Arrival and Wait Times: [x]   Arriving on time ensures that everyone receives prompt attention. [x]   Early morning (8a) and afternoon (1p) appointments tend to have shortest wait times. [x]   Unfortunately, we cannot delay appointments for late arrivals or hold slots during phone calls.  Getting Answers and Following Up [x]   Simple Questions & Concerns: For quick  questions or basic follow-up after your visit, reach [place] at [phone] or MyChart messaging. [x]   Complex Concerns: If your concern is more complex, scheduling an appointment might be best. Discuss this with the staff to find the most suitable option. [x]   Lab & Imaging Results: We'll contact you directly if results are abnormal or you don't use MyChart. Most normal results will be on MyChart within 2-3 business days, with a review message from Dr. Jon Billings. Haven't heard back in 2 weeks? Need results sooner? Contact us at [phone]. [x]   Referrals: Our referral coordinator will manage specialist referrals. The specialist's office should contact you within 2 weeks to schedule an appointment. Call us if you haven't heard from them after 2 weeks.  Staying Connected [x]   MyChart: Activate your MyChart for the fastest way to access results and message [place]. See the last page of this paperwork for instructions on how to activate.  Bring to Your Next Appointment [x]   Medications: Please bring all your medication bottles to your next appointment to ensure we have an accurate record of your prescriptions. [x]   Health Diaries: If you're monitoring any health conditions at home, keeping a diary of your readings can be very helpful for discussions at your next appointment.  Billing [x]   X-ray & Lab Orders: These are billed by separate companies. Contact the invoicing company directly for questions or concerns. [x]   Visit Charges: Discuss any billing inquiries with our administrative services team.  Your Satisfaction Matters [x]   Share Your Experience: We strive for your satisfaction! If you have any complaints, or preferably compliments, please let Dr. Jon Billings know directly or contact our Practice Administrators, Edwena Felty or [employer], by asking at the front desk.   Reviewing Your Records [x]   Review this early draft of your clinical encounter notes below and the final encounter summary  tomorrow on MyChart after its been completed.  All orders placed so far are visible here: Bilateral low back pain, unspecified chronicity, unspecified whether sciatica present -     MR LUMBAR SPINE W WO CONTRAST; Future -     Celecoxib; Take 1 capsule (200 mg total) by mouth 2 (two) times daily.  Dispense: 30 capsule; Refill: 1 -     DG Lumbar Spine 2-3 Views; Future -     DG Thoracic Spine W/Swimmers; Future -     DG Cervical Spine Complete; Future

## 2023-02-19 DIAGNOSIS — G8929 Other chronic pain: Secondary | ICD-10-CM | POA: Insufficient documentation

## 2023-02-19 NOTE — Assessment & Plan Note (Signed)
She has experienced lower back pain since delivery two years ago, accompanied by intermittent fevers. There were difficulties with epidural placement during delivery, raising concerns about possible infection or residual pain from the procedure. An MRI of the lower back will be ordered to rule out infection, and Celebrex is prescribed for pain management.

## 2023-02-19 NOTE — Assessment & Plan Note (Signed)
Her A1c level is 5.5, indicating she is close to the prediabetes range but not diagnostic. She is advised to limit sugar intake and maintain a healthy diet.

## 2023-02-20 ENCOUNTER — Encounter: Payer: Self-pay | Admitting: Internal Medicine

## 2023-02-20 DIAGNOSIS — M419 Scoliosis, unspecified: Secondary | ICD-10-CM | POA: Insufficient documentation

## 2023-02-20 NOTE — Progress Notes (Signed)
Reviewed labs from 02/03/23 and X-rays from 02/18/23. Labs show mild anemia (Hgb 11.6) and slightly elevated glucose (104). X-rays negative for acute findings; mild scoliosis noted. These results don't fully explain ongoing symptoms. Lumbar MRI pending to evaluate for possible epidural abscess. Detailed explanation in Patient Message. Follow-up appointment needed to review MRI results and adjust treatment plan.

## 2023-02-21 ENCOUNTER — Other Ambulatory Visit: Payer: Medicaid Other

## 2023-02-22 ENCOUNTER — Encounter: Payer: Self-pay | Admitting: Internal Medicine

## 2023-02-22 ENCOUNTER — Ambulatory Visit
Admission: RE | Admit: 2023-02-22 | Discharge: 2023-02-22 | Disposition: A | Discharge status: Home / Self Care | Payer: Medicaid Other | Source: Ambulatory Visit | Attending: Internal Medicine | Admitting: Internal Medicine

## 2023-02-22 DIAGNOSIS — G932 Benign intracranial hypertension: Secondary | ICD-10-CM | POA: Insufficient documentation

## 2023-02-22 DIAGNOSIS — M545 Low back pain, unspecified: Secondary | ICD-10-CM

## 2023-02-22 DIAGNOSIS — E236 Other disorders of pituitary gland: Secondary | ICD-10-CM | POA: Insufficient documentation

## 2023-02-22 MED ORDER — GADOPICLENOL 0.5 MMOL/ML IV SOLN
7.0000 mL | Freq: Once | INTRAVENOUS | Status: AC | PRN
  Administered 2023-02-22: 7 mL via INTRAVENOUS

## 2023-02-23 ENCOUNTER — Other Ambulatory Visit: Payer: Self-pay

## 2023-03-01 ENCOUNTER — Other Ambulatory Visit: Payer: Self-pay

## 2023-03-01 DIAGNOSIS — H4710 Unspecified papilledema: Secondary | ICD-10-CM

## 2023-03-01 NOTE — Telephone Encounter (Signed)
Spoke with patient, review of lab result notes confirmed.

## 2023-03-04 ENCOUNTER — Encounter: Payer: Self-pay | Admitting: Internal Medicine

## 2023-03-04 ENCOUNTER — Ambulatory Visit (INDEPENDENT_AMBULATORY_CARE_PROVIDER_SITE_OTHER): Payer: Medicaid Other | Admitting: Internal Medicine

## 2023-03-04 VITALS — BP 108/65 | HR 72 | Temp 97.6°F | Ht 66.0 in | Wt 154.4 lb

## 2023-03-04 DIAGNOSIS — G932 Benign intracranial hypertension: Secondary | ICD-10-CM

## 2023-03-04 DIAGNOSIS — M545 Low back pain, unspecified: Secondary | ICD-10-CM

## 2023-03-04 DIAGNOSIS — D509 Iron deficiency anemia, unspecified: Secondary | ICD-10-CM | POA: Diagnosis not present

## 2023-03-04 DIAGNOSIS — G8929 Other chronic pain: Secondary | ICD-10-CM

## 2023-03-04 MED ORDER — LIDOCAINE 5 % EX OINT
1.0000 | TOPICAL_OINTMENT | CUTANEOUS | 0 refills | Status: DC | PRN
Start: 2023-03-04 — End: 2023-03-28

## 2023-03-04 MED ORDER — METHOCARBAMOL 500 MG PO TABS
500.0000 mg | ORAL_TABLET | Freq: Four times a day (QID) | ORAL | 2 refills | Status: DC
Start: 2023-03-04 — End: 2023-11-21

## 2023-03-04 NOTE — Assessment & Plan Note (Signed)
Anemia was noted in her history. She will be referred to a specialist for further evaluation and management of this condition.

## 2023-03-04 NOTE — Progress Notes (Signed)
[facility]: [phone]   -- Medical Office Visit --  Patient:  Jacqueline Miranda      Age: 22 y.o.       Sex:  female  Date:   03/04/2023 Patient Care Team: Lula Olszewski, MD as PCP - General (Internal Medicine) Today's Healthcare Provider: Lula Olszewski, MD      Assessment & Plan Chronic bilateral low back pain, unspecified whether sciatica present Despite failing physical therapy, X-rays revealed no identifiable cause for her back pain, suggesting inflammation around the site of a previous epidural might be the culprit. Physical therapy will continue as the primary management strategy. IIH (idiopathic intracranial hypertension) MRI results indicated changes in the brain correlating with her headaches and vision issues, though no serious conditions were identified. We discussed the importance of ongoing monitoring and preventative measures. She will be referred to a neurologist for further evaluation and management  Her vision problems are associated with IIH. An eye examination will be scheduled to assess for increased intraocular pressure.  Iron deficiency anemia, unspecified iron deficiency anemia type Anemia was noted in her history. She will be referred to a specialist for further evaluation and management of this condition.  Orders          Ordered    Ambulatory referral to Pain Clinic       Comments: Reason for Referral: chronic low back pain negative MRI, seems to start after epidural.  Has the referral been discussed with the patient?: Yes  Designated contact for the referral if not the patient (name/phone number):   Has the patient seen a specialist for this issue before?: No  If so, who (practice/provider)?  Does the patient have a provider or location preference for the referral?: no Would the patient like to see previous specialist if applicable? No   03/04/23 1030    methocarbamol (ROBAXIN) 500 MG tablet  4 times daily        03/04/23 1030     lidocaine (XYLOCAINE) 5 % ointment  As needed        03/04/23 1030          Diagnoses and all orders for this visit: Chronic bilateral low back pain, unspecified whether sciatica present -     Ambulatory referral to Pain Clinic -     methocarbamol (ROBAXIN) 500 MG tablet; Take 1 tablet (500 mg total) by mouth 4 (four) times daily. -     lidocaine (XYLOCAINE) 5 % ointment; Apply 1 Application topically as needed.  Recommended follow-up: No follow-ups on file. Future Appointments  Date Time Provider Department Center  03/28/2023 10:30 AM Van Clines, MD [facility] None  04/05/2023  2:40 PM Lennette Bihari, MD [facility] None  06/10/2023  9:20 AM Lula Olszewski, MD [facility]           Subjective   22 y.o. female who has Leg pain, bilateral; Blurred vision; Positive RPR test; Acute reaction to situational stress; Back pain; Stomach pain; Syncope; Migraine; Iron deficiency anemia; Impaired fasting glucose; Chronic neck pain; Scoliosis; IIH (idiopathic intracranial hypertension); and Empty sella turcica (HCC) on their problem list. Her reasons/main concerns/chief complaints for today's office visit are 2 week follow-up   ------------------------------------------------------------------------------------------------------------------------ AI-Extracted: Discussed the use of AI scribe software for clinical note transcription with the patient, who gave verbal consent to proceed.  History of Present Illness   The patient, a non-English speaker, presented with a history of headaches and visual disturbances. An MRI of the  brain and lumbar spine was performed, revealing changes in the brain that could explain the patient's symptoms. However, no serious abnormalities were identified.  The patient also reported persistent back pain, which began before the birth of their child in 2021. Despite physical therapy, the pain persisted. An MRI did not reveal a clear cause for the pain, leading  to the suspicion of inflammation around the site of a previous epidural. The patient reported temporary relief from pain medication, but the effect was not lasting.  The patient also has a history of anemia and diabetes. Their most recent hemoglobin level was slightly below normal at 11.6 (normal range 12-15).  The patient's symptoms of idiopathic intracranial hypertension (IIH) were discussed, and a referral to a neurologist was made for further evaluation and management.      She has a past medical history of Anemia, Anemia in pregnancy (06/28/2020), and Toothache (04/21/2020).  Problem list overviews that were updated at today's visit: No problems updated. Current Outpatient Medications on File Prior to Visit  Medication Sig   acetaminophen (TYLENOL) 325 MG tablet Take 2 tablets (650 mg total) by mouth every 4 (four) hours as needed (for pain scale < 4).   celecoxib (CELEBREX) 200 MG capsule Take 1 capsule (200 mg total) by mouth 2 (two) times daily.   Cholecalciferol (VITAMIN D-3 PO) Take 1 capsule by mouth daily. (Patient not taking: Reported on 03/04/2023)   No current facility-administered medications on file prior to visit.  There are no discontinued medications.   Objective   Physical Exam  BP 108/65 (BP Location: Left Arm, Patient Position: Sitting)   Pulse 72   Temp 97.6 F (36.4 C) (Temporal)   Ht 5\' 6"  (1.676 m)   Wt 154 lb 6.4 oz (70 kg)   LMP 02/13/2023 (Approximate)   SpO2 100%   BMI 24.92 kg/m  Wt Readings from Last 10 Encounters:  03/04/23 154 lb 6.4 oz (70 kg)  02/18/23 153 lb 6 oz (69.6 kg)  02/03/23 160 lb 6.4 oz (72.8 kg)  03/29/22 160 lb 2 oz (72.6 kg)  05/19/21 153 lb 6.4 oz (69.6 kg)  09/22/20 145 lb 9.6 oz (66 kg)  09/11/20 144 lb (65.3 kg)  09/04/20 142 lb 9.6 oz (64.7 kg)  08/26/20 145 lb (65.8 kg)  08/05/20 142 lb 9.6 oz (64.7 kg)   Vital signs reviewed.  Nursing notes reviewed. Weight trend reviewed. Abnormalities and Problem-Specific physical  exam findings:  speaks a little english but lets translator do most of speaking  General Appearance:  No acute distress appreciable.   Well-groomed, healthy-appearing female.  Well proportioned with no abnormal fat distribution.  Good muscle tone. Pulmonary:  Normal work of breathing at rest, no respiratory distress apparent. SpO2: 100 %  Musculoskeletal: All extremities are intact.  Neurological:  Awake, alert, oriented, and engaged.  No obvious focal neurological deficits or cognitive impairments.  Sensorium seems unclouded.   Speech is clear and coherent with logical content. Psychiatric:  Appropriate mood, pleasant and cooperative demeanor, thoughtful and engaged during the exam  Results   LABS Hb: 11.6  RADIOLOGY Brain MRI: No significant abnormalities, not the cause of back pain Lumbar MRI: No abnormalities, not the cause of back pain        No results found for any visits on 03/04/23.  Office Visit on 02/18/2023  Component Date Value   Hemoglobin A1C 02/18/2023 5.5   Office Visit on 02/03/2023  Component Date Value   Cholesterol 02/03/2023 114  Triglycerides 02/03/2023 65.0    HDL 02/03/2023 53.40    VLDL 02/03/2023 13.0    LDL Cholesterol 02/03/2023 47    Total CHOL/HDL Ratio 02/03/2023 2    NonHDL 02/03/2023 60.33    TSH 02/03/2023 1.23    Sodium 02/03/2023 137    Potassium 02/03/2023 3.5    Chloride 02/03/2023 105    CO2 02/03/2023 27    Glucose, Bld 02/03/2023 104 (H)    BUN 02/03/2023 15    Creatinine, Ser 02/03/2023 0.56    Total Bilirubin 02/03/2023 0.6    Alkaline Phosphatase 02/03/2023 87    AST 02/03/2023 18    ALT 02/03/2023 11    Total Protein 02/03/2023 7.8    Albumin 02/03/2023 4.1    GFR 02/03/2023 127.68    Calcium 02/03/2023 9.3    WBC 02/03/2023 8.0    RBC 02/03/2023 4.24    Hemoglobin 02/03/2023 11.6 (L)    HCT 02/03/2023 34.7 (L)    MCV 02/03/2023 81.9    MCHC 02/03/2023 33.5    RDW 02/03/2023 13.3    Platelets 02/03/2023 298.0     Neutrophils Relative % 02/03/2023 50.1    Lymphocytes Relative 02/03/2023 34.8    Monocytes Relative 02/03/2023 9.5    Eosinophils Relative 02/03/2023 5.0    Basophils Relative 02/03/2023 0.6    Neutro Abs 02/03/2023 4.0    Lymphs Abs 02/03/2023 2.8    Monocytes Absolute 02/03/2023 0.8    Eosinophils Absolute 02/03/2023 0.4    Basophils Absolute 02/03/2023 0.0    Troponin I 02/03/2023 <3    Alcohol Metabolites 02/03/2023 NEGATIVE    Amphetamines 02/03/2023 NEGATIVE    Benzodiazepines 02/03/2023 NEGATIVE    Buprenorphine, Urine 02/03/2023 NEGATIVE    Cocaine Metabolite 02/03/2023 NEGATIVE    6 Acetylmorphine 02/03/2023 NEGATIVE    Marijuana Metabolite 02/03/2023 NEGATIVE    MDMA 02/03/2023 NEGATIVE    Opiates 02/03/2023 NEGATIVE    Oxycodone 02/03/2023 NEGATIVE    Creatinine 02/03/2023 117.8    pH 02/03/2023 7.8    Oxidant 02/03/2023 NEGATIVE    Preg Test, Ur 02/03/2023 NEGATIVE    Color, Urine 02/03/2023 YELLOW    APPearance 02/03/2023 CLOUDY (A)    Specific Gravity, Urine 02/03/2023 1.023    pH 02/03/2023 7.5    Glucose, UA 02/03/2023 NEGATIVE    Bilirubin Urine 02/03/2023 NEGATIVE    Ketones, ur 02/03/2023 NEGATIVE    Hgb urine dipstick 02/03/2023 NEGATIVE    Protein, ur 02/03/2023 NEGATIVE    Nitrite 02/03/2023 NEGATIVE    Leukocytes,Ua 02/03/2023 NEGATIVE    Notes and Comments 02/03/2023     No image results found.   MR LUMBAR SPINE W WO CONTRAST  Result Date: 02/22/2023 CLINICAL DATA:  Low back pain, infection suspected, positive xray/CT fevers, low back pain, syncope occurring after epidural with mulitple needlestick EXAM: MRI LUMBAR SPINE WITHOUT AND WITH CONTRAST TECHNIQUE: Multiplanar and multiecho pulse sequences of the lumbar spine were obtained without and with intravenous contrast. CONTRAST:  7 mL of Vueway IV COMPARISON:  Lumbar spine radiographs 02/18/2023. FINDINGS: Segmentation:  Standard. Alignment:  No substantial sagittal subluxation. Vertebrae: No  fracture, evidence of discitis, or suspicious bone lesion. No abnormal enhancement. No marrow edema. Conus medullaris and cauda equina: Conus extends to the L1-L2 level. Conus and cauda equina appear normal. Paraspinal and other soft tissues: Partially imaged left ovarian cyst. No paraspinal edema. Disc levels: No significant disc protrusion, foraminal stenosis, or canal stenosis. IMPRESSION: 1. No findings to suggest discitis/osteomyelitis. 2. No significant stenosis. Electronically  Signed   By: Feliberto Harts M.D.   On: 02/22/2023 16:46   MR Brain W Wo Contrast  Result Date: 02/22/2023 CLINICAL DATA:  Provided history: Other migraine without status migrainosus, not intractable. Syncope/presyncope, cerebrovascular cause suspected. Headache, increasing frequency or severity. Additional history provided by the scanning technologist: The patient reports headaches, "floaters." EXAM: MRI HEAD WITHOUT AND WITH CONTRAST TECHNIQUE: Multiplanar, multiecho pulse sequences of the brain and surrounding structures were obtained without and with intravenous contrast. CONTRAST:  8 mL Vueway intravenous contrast. COMPARISON:  None. FINDINGS: Brain: Cerebral volume is normal. Partially empty sella turcica. Mild cerebellar tonsillar ectopia (with the cerebellar tonsils extending 4 mm below level of foramen magnum). No cortical encephalomalacia is identified. No significant cerebral white matter disease. There is no acute infarct. No evidence of an intracranial mass. No chronic intracranial blood products. No extra-axial fluid collection. No midline shift. No pathologic intracranial enhancement identified. Vascular: Maintained flow voids within the proximal large arterial vessels. Narrowed appearance of the distal transverse dural venous sinuses bilaterally. Skull and upper cervical spine: Abnormal T1 hypointense marrow signal within the imaged cervical spine. Sinuses/Orbits: No mass or acute finding within the imaged orbits.  No significant paranasal sinus disease. IMPRESSION: 1. Partially empty sella turcica. Mild cerebellar tonsillar ectopia. Narrowed appearance of the distal transverse dural venous sinuses bilaterally. This constellation of findings can be seen in the setting of idiopathic intracranial hypertension (pseudotumor cerebri). 2. Otherwise unremarkable MRI appearance of the brain. 3. Abnormal T1 hypointense marrow signal within the imaged cervical spine. While this finding can reflect a marrow infiltrative process, the most common causes include chronic anemia, smoking and obesity. Electronically Signed   By: Jackey Loge D.O.   On: 02/22/2023 08:51   DG Cervical Spine Complete  Result Date: 02/18/2023 CLINICAL DATA:  Neck pain. EXAM: CERVICAL SPINE - COMPLETE 4+ VIEW COMPARISON:  None Available. FINDINGS: There is no evidence of cervical spine fracture or prevertebral soft tissue swelling. Alignment is normal. No other significant bone abnormalities are identified. IMPRESSION: Negative cervical spine radiographs. Electronically Signed   By: Sherian Rein M.D.   On: 02/18/2023 15:24   DG Thoracic Spine W/Swimmers  Result Date: 02/18/2023 CLINICAL DATA:  Mid back pain. EXAM: THORACIC SPINE - 3 VIEWS COMPARISON:  None Available. FINDINGS: There is no evidence of thoracic spine fracture. Scoliosis. No other significant bone abnormalities are identified. IMPRESSION: No acute fracture or dislocation. Scoliosis. Electronically Signed   By: Sherian Rein M.D.   On: 02/18/2023 15:24   DG Lumbar Spine 2-3 Views  Result Date: 02/18/2023 CLINICAL DATA:  Low back pain radiating to bilateral legs. EXAM: LUMBAR SPINE - 2-3 VIEW COMPARISON:  None Available. FINDINGS: There is no evidence of lumbar spine fracture. Mild curvature of spine. Intervertebral disc spaces are maintained. IMPRESSION: No acute fracture or dislocation. Mild curvature of spine. Electronically Signed   By: Sherian Rein M.D.   On: 02/18/2023 15:23    MR  LUMBAR SPINE W WO CONTRAST  Result Date: 02/22/2023 CLINICAL DATA:  Low back pain, infection suspected, positive xray/CT fevers, low back pain, syncope occurring after epidural with mulitple needlestick EXAM: MRI LUMBAR SPINE WITHOUT AND WITH CONTRAST TECHNIQUE: Multiplanar and multiecho pulse sequences of the lumbar spine were obtained without and with intravenous contrast. CONTRAST:  7 mL of Vueway IV COMPARISON:  Lumbar spine radiographs 02/18/2023. FINDINGS: Segmentation:  Standard. Alignment:  No substantial sagittal subluxation. Vertebrae: No fracture, evidence of discitis, or suspicious bone lesion. No abnormal enhancement. No marrow  edema. Conus medullaris and cauda equina: Conus extends to the L1-L2 level. Conus and cauda equina appear normal. Paraspinal and other soft tissues: Partially imaged left ovarian cyst. No paraspinal edema. Disc levels: No significant disc protrusion, foraminal stenosis, or canal stenosis. IMPRESSION: 1. No findings to suggest discitis/osteomyelitis. 2. No significant stenosis. Electronically Signed   By: Feliberto Harts M.D.   On: 02/22/2023 16:46       Additional Info: This encounter employed real-time, collaborative documentation. The patient actively reviewed and updated their medical record on a shared screen, ensuring transparency and facilitating joint problem-solving for the problem list, overview, and plan. This approach promotes accurate, informed care. The treatment plan was discussed and reviewed in detail, including medication safety, potential side effects, and all patient questions. We confirmed understanding and comfort with the plan. Follow-up instructions were established, including contacting the office for any concerns, returning if symptoms worsen, persist, or new symptoms develop, and precautions for potential emergency department visits.

## 2023-03-04 NOTE — Patient Instructions (Addendum)
VISIT SUMMARY:  During your visit, we discussed your ongoing headaches, visual disturbances, and persistent back pain. We also reviewed your history of anemia and diabetes. An MRI of your brain and lumbar spine was performed, which showed changes in your brain that could explain your symptoms, but no serious abnormalities were found. Your back pain, which began before the birth of your child, has not improved with physical therapy. An MRI did not reveal a clear cause for this pain, but we suspect it may be due to inflammation around the site of a previous epidural. Your most recent hemoglobin level was slightly below normal, indicating anemia.  YOUR PLAN:  -HEADACHES AND VISUAL DISTURBANCES: These symptoms may be due to a condition called Idiopathic Intracranial Hypertension (IIH), which is when there is high pressure in the fluid around your brain. You will be referred to a neurologist for further evaluation and management, and a procedure called a lumbar puncture will likely be performed to measure the pressure of the fluid around your brain.  If the diagnosis is confirmed then effective treatments are available.  -BACK PAIN: Your back pain may be due to inflammation around the site of a previous epidural. We will continue with physical therapy as the primary management strategy, add pain management and creams and muscle relaxers  -ANEMIA: Anemia is a condition where you don't have enough healthy red blood cells to carry adequate oxygen to your body's tissues. We will monitor but yours is mild, return to office in 3 months for recheck  -VISION PROBLEMS: Your vision problems may be associated with IIH. An eye examination will be scheduled to assess for increased pressure in your eyes.  INSTRUCTIONS:  Please make sure to schedule and attend all your referral appointments. It's important to continue with your physical therapy for your back pain. If you notice any changes in your symptoms, please  contact our office immediately.

## 2023-03-04 NOTE — Assessment & Plan Note (Signed)
MRI results indicated changes in the brain correlating with her headaches and vision issues, though no serious conditions were identified. We discussed the importance of ongoing monitoring and preventative measures. She will be referred to a neurologist for further evaluation and management  Her vision problems are associated with IIH. An eye examination will be scheduled to assess for increased intraocular pressure.

## 2023-03-04 NOTE — Assessment & Plan Note (Signed)
Despite failing physical therapy, X-rays revealed no identifiable cause for her back pain, suggesting inflammation around the site of a previous epidural might be the culprit. Physical therapy will continue as the primary management strategy.

## 2023-03-17 ENCOUNTER — Encounter: Payer: Self-pay | Admitting: Physical Medicine and Rehabilitation

## 2023-03-25 ENCOUNTER — Encounter: Payer: Self-pay | Admitting: Internal Medicine

## 2023-03-28 ENCOUNTER — Ambulatory Visit (INDEPENDENT_AMBULATORY_CARE_PROVIDER_SITE_OTHER): Payer: Medicaid Other | Admitting: Neurology

## 2023-03-28 ENCOUNTER — Other Ambulatory Visit (INDEPENDENT_AMBULATORY_CARE_PROVIDER_SITE_OTHER): Payer: Medicaid Other

## 2023-03-28 ENCOUNTER — Other Ambulatory Visit: Payer: Self-pay

## 2023-03-28 ENCOUNTER — Encounter: Payer: Self-pay | Admitting: Neurology

## 2023-03-28 VITALS — BP 105/70 | HR 65 | Ht 65.0 in | Wt 154.8 lb

## 2023-03-28 DIAGNOSIS — M542 Cervicalgia: Secondary | ICD-10-CM

## 2023-03-28 DIAGNOSIS — H5712 Ocular pain, left eye: Secondary | ICD-10-CM

## 2023-03-28 DIAGNOSIS — G8929 Other chronic pain: Secondary | ICD-10-CM

## 2023-03-28 DIAGNOSIS — R200 Anesthesia of skin: Secondary | ICD-10-CM

## 2023-03-28 DIAGNOSIS — M549 Dorsalgia, unspecified: Secondary | ICD-10-CM

## 2023-03-28 DIAGNOSIS — R519 Headache, unspecified: Secondary | ICD-10-CM

## 2023-03-28 DIAGNOSIS — E236 Other disorders of pituitary gland: Secondary | ICD-10-CM | POA: Diagnosis not present

## 2023-03-28 DIAGNOSIS — R55 Syncope and collapse: Secondary | ICD-10-CM | POA: Diagnosis not present

## 2023-03-28 LAB — C-REACTIVE PROTEIN: CRP: <1 mg/dL (ref 0.5–20.0)

## 2023-03-28 LAB — VITAMIN B12: Vitamin B-12: 789 pg/mL (ref 211–911)

## 2023-03-28 LAB — SEDIMENTATION RATE: Sed Rate: 33 mm/h — ABNORMAL HIGH (ref 0–20)

## 2023-03-28 NOTE — Progress Notes (Signed)
NEUROLOGY CONSULTATION NOTE  Graycelyn Kinzler MRN: [id number] DOB: 06-Feb-2001  Referring provider: Dr. [name] Primary care provider: Dr. [name]  Reason for consult:  syncope, migraine  Dear Dr Jon Billings:  Thank you for your kind referral of Arvie Chana for consultation of the above symptoms. Although her history is well known to you, please allow me to reiterate it for the purpose of our medical record. She is alone in the office today. An Arabic medical interpreter, Bedor, helps with translation. Records and images were personally reviewed where available.   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 She speaks some [place], the interpreter translates in Arabic as well.   She reports that she has had only one full episode of loss of consciousness that occurred in July. She was having a headache and recalls going up the stairs. She felt dizzy then woke up on the floor. Her mother found her, no convulsive activity reported. No tongue bite or incontinence. She woke up with the headache and dizziness. She did not seek care that time. She then reports another episode of near syncope in August, she again had a headache and dizziness, she sat down and repeatedly says she did not pass out this time. It took more than an hour to feel better. No further similar instances since then, however she continues to report headache and dizziness. These started after she delivered by baby girl 2.5 years ago. She states she has been staying away from sugar recently and she feels better, with headaches occurring around 2 days a week. Sometimes they occur once a week, other times they last all week. There is no nausea/vomiting but she spits a  lot. She is sensitive to lights with them. Sometimes she cannot see clearly, L>R right eye. She takes Celecoxib which helps. When she has a headache, she has dizziness where she feels like she cannot control her body. She demonstrates slight hand tremors that occur with them. No spinning sensation. She has constant numbness in both hands and feet, sometimes the numbness moves up her right arm. She has neck and back pain. No bowel/bladder dysfunction.  She started having left eye issues in 2021 and [place] a doctor out of state who told her she had a virus and prescribed medication which made her feel better. She then moved to [place] 2 weeks later and since then eye pain has recurred. Sometimes there is itching. She sometimes notices ?tinnitus on the right ear when lying on her right side, sometimes it is pulsatile or a low-pitched sound. No dysarthria/dysphagia, bowel/bladder dysfunction. Her mother has headaches. No family history of syncope. She denies any recent head injuries. She lives with 8 people in their home. She is unemployed, studying for her GED. She is driving.   I personally reviewed brain MRI with and without contrast done 02/22/23 which did not show any acute changes. 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  MRI lumbar spine without contrast normal.   PAST MEDICAL HISTORY: Past Medical History:  Diagnosis Date   Anemia    Anemia in pregnancy 06/28/2020   Rx iron     Toothache 04/21/2020    PAST SURGICAL HISTORY: Past Surgical  History:  Procedure Laterality Date   TONSILLECTOMY      MEDICATIONS: Current Outpatient Medications on File Prior to Visit  Medication Sig Dispense Refill   acetaminophen (TYLENOL) 325 MG tablet Take 2 tablets (650 mg total) by mouth every 4 (four) hours as needed (for pain scale < 4). 100 tablet 0   celecoxib (CELEBREX) 200 MG capsule  Take 1 capsule (200 mg total) by mouth 2 (two) times daily. 30 capsule 1   methocarbamol (ROBAXIN) 500 MG tablet Take 1 tablet (500 mg total) by mouth 4 (four) times daily. 60 tablet 2   No current facility-administered medications on file prior to visit.    ALLERGIES: No Known Allergies  FAMILY HISTORY: Family History  Problem Relation Age of Onset   Hepatitis B Father    Asthma Mother     SOCIAL HISTORY: Social History   Socioeconomic History   Marital status: Single    Spouse name: Not on file   Number of children: Not on file   Years of education: Not on file   Highest education level: GED or equivalent  Occupational History   Not on file  Tobacco Use   Smoking status: Never   Smokeless tobacco: Never  Vaping Use   Vaping status: Never Used  Substance and Sexual Activity   Alcohol use: Never   Drug use: Never   Sexual activity: Not Currently  Other Topics Concern   Not on file  Social History Narrative   Father is Arob Scientist, physiological   Mother is [employer] Care[employer] information officer       Refugee Information   Number of Immediate Family Members: 8   Number of Immediate Family Members in [place]: 8   Date of Arrival: 02/12/20   Country of Birth: [place]   Other Country of Origin:: [place]   Location of Refugee [place]: Other   Other Location of Refugee Camp:: [place]   Duration in [place]: 11-15 years   Reason for Leaving Home Country: Religion   Primary Language: Other   Other Primary Language:: D[place]a   Able to Read in Primary Language: No   Able to Write in Primary Language: No   Education: Primary School   Marital Status: Single   Tuberculosis Screening Overseas: Negative   Health Department Labs Completed: Yes   Do You Feel Jumpy or Nervous?: No   Are You Very Watchful or 'Super Alert'?: No   Social Determinants of Health   Financial Resource Strain: High Risk (02/17/2023)   Overall Financial Resource Strain (CARDIA)    Difficulty of Paying Living Expenses: Hard  Food Insecurity: No Food  Insecurity (02/17/2023)   Hunger Vital Sign    Worried About Running Out of Food in the Last Year: Never true    Ran Out of Food in the Last Year: Never true  Transportation Needs: Unmet Transportation Needs (02/17/2023)   PRAPARE - Transportation    Lack of Transportation (Medical): No    Lack of Transportation (Non-Medical): Yes  Physical Activity: Insufficiently Active (02/17/2023)   Exercise Vital Sign    Days of Exercise per Week: 3 days    Minutes of Exercise per Session: 30 min  Stress: No Stress Concern Present (02/17/2023)   [employer] of Occupational Health - Occupational Stress Questionnaire    Feeling of Stress : Not at all  Social Connections: Socially Integrated (02/17/2023)   Social Connection and Isolation Panel [NHANES]    Frequency of Communication with Friends and Family: More than three times a week  Frequency of Social Gatherings with Friends and Family: Twice a week    Attends Religious Services: More than 4 times per year    Active Member of [employer] or Organizations: Yes    Attends Engineer, structural: More than 4 times per year    Marital Status: Living with partner  Intimate Partner Violence: Not on file     PHYSICAL EXAM: Vitals:   03/28/23 1016  BP: 105/70  Pulse: 65  SpO2: 99%   General: No acute distress Head:  Normocephalic/atraumatic Skin/Extremities: No rash, no edema Neurological Exam: Mental status: alert and awake, no dysarthria or aphasia, Fund of knowledge is appropriate.  Attention and concentration are normal.   Cranial nerves: CN I: not tested CN II: pupils equal, round, visual fields intact CN III, IV, VI:  full range of motion, no nystagmus, no ptosis CN V: facial sensation intact CN VII: upper and lower face symmetric CN VIII: hearing intact to conversation Bulk & Tone: normal, no fasciculations. Motor: 5/5 throughout with no pronator drift. Sensation: intact to light touch, cold, pin, vibration sense.  No  extinction to double simultaneous stimulation.  Romberg test negative Deep Tendon Reflexes: +1 throughout Cerebellar: no incoordination on finger to nose testing Gait: narrow-based and steady, able to tandem walk adequately. 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 She reports constant numbness in her hands, feet, sometimes up her right arm. She has neck and back pain. Her neurological exam is normal. Etiology of symptoms unclear. Syncope may have been vasovagal, she is also seeing Cardiology. EEG will be ordered. There are some migrainous components to her headaches. The MRI findings may be incidental, we will do further evaluation for Idiopathic Intracranial Hypertension with an MRV head without contrast and lumbar puncture to measure opening pressure. Agree with ophthalmology evaluation to assess for papilledema. Cervicogenic headaches are still a possibility. Check ESR, CRP, ANA, B12. [place] driving laws were discussed with the patient, and she knows to stop driving after an episode of loss of consciousness until 6 months event-free. Follow-up in 3 months, call for any changes.    Thank you for allowing me to participate in the care of this patient. Please do not hesitate to call for any questions or concerns.   Patrcia Dolly, M.D.  CC: Dr. Jon Billings

## 2023-03-28 NOTE — Patient Instructions (Addendum)
?? ????? ???????.    1. ?? ?????? ?????? ???? ?? ??? ESR ?CRP ?ANA ???????? B12  2. ????? ??? MRV ???? ?????  3. ????? ????? ?????? ????? ??? ?????  4. ???? ???? ?????? ??????  5. ?? ?????? ??????? ??? ???? ???? ??????? ?????? ?? ????? ??????? (???????) ???? ??? ???? ??? ????? ??? ??? ????? ?????? ???????." (???? ???? ???? ???????? ??????? ????? ??????? ?????? ???????? ??? ???????? ?????? ? ??? ???? ???????? ??? ???????? ??????? ????? ????? ?????? ???? ????????? ????? 2004)  6. ???????? ??? ?????????? ???????? ???? ??????? min aljayid muqabalatuki. 1. qum bi'iijra' fuhusat aldam min 'ajl ESR waCRP waANA wafitamin B12 2. jadwalat ras MRV bidun tabayun 3. jadwalat albazal alqutnii liqias daght alfath 4. jadwal mukhatat kahrabiat aldimagh 5. min alhikmat altawsiat bi'an yakun jamie al'ashkhas khaliin min nawbat al'iighma' (al'iighma'i) limudat sitat 'ashhur ealaa al'aqali hataa yatima manhuhum aimtiaz alqiadati." (dalil tabib nurth karwlyna liltaqyim altibiyi lilsaayiqi, altabeat althaaniatu, fare almurajaeat altibiyat , qism rukhsat alqiadati, qism almarkabat alalit, wizarat alnaql biwilayat nurth karulina, yuliu 2004) 6. almutabaeat baed alaikhtibarat walaitisal li'ayat taghyirat   Good to meet you.  Have bloodwork done for ESR, CRP, ANA, vitamin B12  2. Schedule MRV head without contrast  3. Schedule lumbar puncture to measure opening pressure  4. Schedule EEG  5. It is prudent to recommend that all persons should be free of syncopal (passing out) episodes for at least six months to be granted the driving privilege." (THE [place] PHYSICIAN'S GUIDE TO DRIVER MEDICAL EVALUATION, Second Edition, Medical Review Branch, Associate Professor, Division of [employer], [employer], July 2004)  6. Follow-up after tests, call for any changes

## 2023-03-29 ENCOUNTER — Ambulatory Visit: Payer: Medicaid Other | Admitting: Neurology

## 2023-03-29 DIAGNOSIS — R55 Syncope and collapse: Secondary | ICD-10-CM | POA: Diagnosis not present

## 2023-03-29 LAB — ANA: Anti Nuclear Antibody (ANA): NEGATIVE

## 2023-03-29 NOTE — Progress Notes (Signed)
EEG complete - results pending 

## 2023-04-01 ENCOUNTER — Encounter: Payer: Self-pay | Admitting: Neurology

## 2023-04-05 ENCOUNTER — Ambulatory Visit: Payer: Medicaid Other | Attending: Cardiovascular Disease | Admitting: Cardiovascular Disease

## 2023-04-05 ENCOUNTER — Ambulatory Visit (INDEPENDENT_AMBULATORY_CARE_PROVIDER_SITE_OTHER): Payer: Medicaid Other

## 2023-04-05 ENCOUNTER — Encounter: Payer: Self-pay | Admitting: Cardiovascular Disease

## 2023-04-05 VITALS — BP 102/62 | HR 64 | Ht 65.0 in | Wt 154.0 lb

## 2023-04-05 DIAGNOSIS — G8929 Other chronic pain: Secondary | ICD-10-CM

## 2023-04-05 DIAGNOSIS — D649 Anemia, unspecified: Secondary | ICD-10-CM | POA: Diagnosis not present

## 2023-04-05 DIAGNOSIS — M542 Cervicalgia: Secondary | ICD-10-CM

## 2023-04-05 DIAGNOSIS — E236 Other disorders of pituitary gland: Secondary | ICD-10-CM | POA: Diagnosis not present

## 2023-04-05 DIAGNOSIS — R55 Syncope and collapse: Secondary | ICD-10-CM

## 2023-04-05 NOTE — Patient Instructions (Signed)
Medication Instructions:    *If you need a refill on your cardiac medications before your next appointment, please call your pharmacy*   Lab Work:  If you have labs (blood work) drawn today and your tests are completely normal, you will receive your results only by: MyChart Message (if you have MyChart) OR A paper copy in the mail If you have any lab test that is abnormal or we need to change your treatment, we will call you to review the results.   Testing/Procedures:  Will be schedule at [address] Your physician has requested that you have an echocardiogram. Echocardiography is a painless test that uses sound waves to create images of your heart. It provides your doctor with information about the size and shape of your heart and how well your heart's chambers and valves are working. This procedure takes approximately one hour. There are no restrictions for this procedure. Please do NOT wear cologne, perfume, aftershave, or lotions (deodorant is allowed). Please arrive 15 minutes prior to your appointment time.   And   Will be mailed to you in 3 to 7 days Your physician has recommended that you wear a holter monitor.- 14 day zio  Holter monitors are medical devices that record the heart's electrical activity. Doctors most often use these monitors to diagnose arrhythmias. Arrhythmias are problems with the speed or rhythm of the heartbeat. The monitor is a small, portable device. You can wear one while you do your normal daily activities. This is usually used to diagnose what is causing palpitations/syncope (passing out).  Please note: We ask at that you not bring children with you during ultrasound (echo/ vascular) testing. Due to room size and safety concerns, children are not allowed in the ultrasound rooms during exams. Our front office staff cannot provide observation of children in our lobby area while testing is being conducted. An adult accompanying a patient  to their appointment will only be allowed in the ultrasound room at the discretion of the ultrasound technician under special circumstances. We apologize for any inconvenience.    Follow-Up: At [facility], you and your health needs are our priority.  As part of our continuing mission to provide you with exceptional heart care, we have created designated Provider Care Teams.  These Care Teams include your primary Cardiologist (physician) and Advanced Practice Providers (APPs -  Physician Assistants and Nurse Practitioners) who all work together to provide you with the care you need, when you need it.     Your next appointment:   As need if test are normal  ( primary Dr Jon Billings  will discuss results)  The format for your next appointment:   In Person  Provider:   Nicki Guadalajara, MD    Other Instructions  ZIO XT- Long Term Monitor Instructions  Your physician has requested you wear a ZIO patch monitor for 14 days.  This is a single patch monitor. [employer] supplies one patch monitor per enrollment. Additional stickers are not available. Please do not apply patch if you will be having a Nuclear Stress Test,  Echocardiogram, Cardiac CT, MRI, or Chest Xray during the period you would be wearing the  monitor. The patch cannot be worn during these tests. You cannot remove and re-apply the  ZIO XT patch monitor.  Your ZIO patch monitor will be mailed 3 day USPS to your address on file. It may take 3-5 days  to receive your monitor after you have been enrolled.  Once you have received your monitor, please review the enclosed instructions. Your monitor  has already been registered assigning a specific monitor serial # to you.  Billing and Patient Assistance Program Information  We have supplied [employer] with any of your insurance information on file for billing purposes. [employer] offers a sliding scale Patient Assistance Program for patients that do not have  insurance, or whose insurance  does not completely cover the cost of the ZIO monitor.  You must apply for the Patient Assistance Program to qualify for this discounted rate.  To apply, please call [employer] at [phone], select option 4, select option 2, ask to apply for  Patient Assistance Program. Meredeth Ide will ask your household income, and how many people  are in your household. They will quote your out-of-pocket cost based on that information.  [employer] will also be able to set up a 52-month, interest-free payment plan if needed.  Applying the monitor   Shave hair from upper left chest.  Hold abrader disc by orange tab. Rub abrader in 40 strokes over the upper left chest as  indicated in your monitor instructions.  Clean area with 4 enclosed alcohol pads. Let dry.  Apply patch as indicated in monitor instructions. Patch will be placed under collarbone on left  side of chest with arrow pointing upward.  Rub patch adhesive wings for 2 minutes. Remove white label marked "1". Remove the white  label marked "2". Rub patch adhesive wings for 2 additional minutes.  While looking in a mirror, press and release button in center of patch. A small green light will  flash 3-4 times. This will be your only indicator that the monitor has been turned on.  Do not shower for the first 24 hours. You may shower after the first 24 hours.  Press the button if you feel a symptom. You will hear a small click. Record Date, Time and  Symptom in the Patient Logbook.  When you are ready to remove the patch, follow instructions on the last 2 pages of Patient  Logbook. Stick patch monitor onto the last page of Patient Logbook.  Place Patient Logbook in the blue and white box. Use locking tab on box and tape box closed  securely. The blue and white box has prepaid postage on it. Please place it in the mailbox as  soon as possible. Your physician should have your test results approximately 7 days after the  monitor has been mailed back to  [facility].  Call [facility] Customer Care at [phone] if you have questions regarding  your ZIO XT patch monitor. Call them immediately if you see an orange light blinking on your  monitor.  If your monitor falls off in less than 4 days, contact our Monitor department at [phone].  If your monitor becomes loose or falls off after 4 days call [employer] at [phone] for  suggestions on securing your monitor

## 2023-04-05 NOTE — Progress Notes (Signed)
Cardiology Office Note    Date:  04/11/2023   ID:  Jacqueline Miranda, DOB 03-04-2001, MRN [id number]  PCP:  Lula Olszewski, MD  Cardiologist:  Nicki Guadalajara, MD   New cardiology consult referred by Dr. Jon Billings for evaluation of syncope.  Chief Complaint  Patient presents with   New Patient (Initial Visit)    Orthostatic BP.   Loss of Consciousness   Headache   Shortness of Breath   Chest Pain    History of Present Illness:  Jacqueline Miranda is a 22 y.o. female originally from [place] who has been living in [place] since 2021.  She is referred by Dr. Jon Billings for evaluation of syncope.  She is here with an arrhythmic interpreter.  Ms. Jacqueline Miranda admits to overall good health.  However she had experienced an episodes of syncope in late July and presyncope in August 2024. She denied any associated seizure activity, convulsions, recent cessation of alcohol or benzo diazepam use or new medications.  She had experienced several episodes of lightheadedness and vertigo.  She was seen by Dr. Jon Billings and ultimately was referred to neurology as well as to cardiology.  She had seen Dr. Patrcia Dolly of neurology in March 28, 2023.  She has a history of anemia and MRI of her brain had shown partially empty sella, narrowed appearance of distal transverse dural venous sinuses bilaterally and mild cerebellar tonsillar ectopia.  She has experienced frequent numbness of her hands feet and sometimes right upper arm and at times experiences neck and back pain.  She had a relatively normal neurologic exam.  There was some thought perhaps that her syncope may have been vasovagal.  An EEG was ordered.  An further evaluation for idiopathic intracranial hypertension may be under taken.  Patient was to have sed rate, CRP, ANA, and B12 assessment.  From a cardiac perspective, she denies any chest pain.  At times she does experience some mild shortness of breath and clumsiness.  She is unaware of any tachypalpitations.   She now presents for cardiology evaluation.   Past Medical History:  Diagnosis Date   Anemia    Anemia in pregnancy 06/28/2020   Rx iron     Toothache 04/21/2020    Past Surgical History:  Procedure Laterality Date   TONSILLECTOMY      Current Medications: Outpatient Medications Prior to Visit  Medication Sig Dispense Refill   acetaminophen (TYLENOL) 325 MG tablet Take 2 tablets (650 mg total) by mouth every 4 (four) hours as needed (for pain scale < 4). 100 tablet 0   celecoxib (CELEBREX) 200 MG capsule Take 1 capsule (200 mg total) by mouth 2 (two) times daily. 30 capsule 1   methocarbamol (ROBAXIN) 500 MG tablet Take 1 tablet (500 mg total) by mouth 4 (four) times daily. 60 tablet 2   No facility-administered medications prior to visit.     Allergies:   Patient has no known allergies.   Social History   Socioeconomic History   Marital status: Single    Spouse name: Not on file   Number of children: Not on file   Years of education: Not on file   Highest education level: GED or equivalent  Occupational History   Not on file  Tobacco Use   Smoking status: Never   Smokeless tobacco: Never  Vaping Use   Vaping status: Never Used  Substance and Sexual Activity   Alcohol use: Never   Drug use: Never  Sexual activity: Not Currently  Other Topics Concern   Not on file  Social History Narrative   Father is Arob Scientist, physiological   Mother is [employer] Care[employer] information officer       Refugee Information   Number of Immediate Family Members: 8   Number of Immediate Family Members in [place]: 8   Date of Arrival: 02/12/20   Country of Birth: [place]   Other Country of Origin:: [place]   Location of Refugee Camp: Other   Other Location of Refugee Camp:: [place]   Duration in [place]: 11-15 years   Reason for Leaving Home Country: Religion   Primary Language: Other   Other Primary Language:: Dinka   Able to Read in Primary Language: No   Able to Write in Primary Language: No   Education: Primary School    Marital Status: Single   Tuberculosis Screening Overseas: Negative   Health Department Labs Completed: Yes   Do You Feel Jumpy or Nervous?: No   Are You Very Watchful or 'Super Alert'?: No   Social Determinants of Health   Financial Resource Strain: High Risk (02/17/2023)   Overall Financial Resource Strain (CARDIA)    Difficulty of Paying Living Expenses: Hard  Food Insecurity: No Food Insecurity (02/17/2023)   Hunger Vital Sign    Worried About Running Out of Food in the Last Year: Never true    Ran Out of Food in the Last Year: Never true  Transportation Needs: Unmet Transportation Needs (02/17/2023)   PRAPARE - Transportation    Lack of Transportation (Medical): No    Lack of Transportation (Non-Medical): Yes  Physical Activity: Insufficiently Active (02/17/2023)   Exercise Vital Sign    Days of Exercise per Week: 3 days    Minutes of Exercise per Session: 30 min  Stress: No Stress Concern Present (02/17/2023)   [employer] of Occupational Health - Occupational Stress Questionnaire    Feeling of Stress : Not at all  Social Connections: Socially Integrated (02/17/2023)   Social Connection and Isolation Panel [NHANES]    Frequency of Communication with Friends and Family: More than three times a week    Frequency of Social Gatherings with Friends and Family: Twice a week    Attends Religious Services: More than 4 times per year    Active Member of [employer] or Organizations: Yes    Attends Banker Meetings: More than 4 times per year    Marital Status: Living with partner    Socially she was born in [place] and moved to the [place] in 2021.  She is single and has 1 child.  She completed GED and she TNT.  There is no tobacco or alcohol use.  There is no illicit drug use.  She goes to the gym and walks at least 2 days/week for up to an hour.  Family History:  The patient's family history includes Asthma in her mother; Hepatitis B in her father.  Her mother is alive at age 40  with prediabetes.  Father is 70.  She has 3 brothers and 2 sisters who are alive and well with ages ranging from 14-14.  She has a daughter 26 years old.  ROS General: Negative; No fevers, chills, or night sweats;  HEENT: Negative; No changes in vision or hearing, sinus congestion, difficulty swallowing Pulmonary: Negative; No cough, wheezing, shortness of breath, hemoptysis Cardiovascular: See HPI GI: Negative; No nausea, vomiting, diarrhea, or abdominal pain GU: Negative; No dysuria, hematuria, or difficulty voiding Musculoskeletal: Negative; no myalgias,  joint pain, or weakness Hematologic/Oncology: Negative; no easy bruising, bleeding Endocrine: Negative; no heat/cold intolerance; no diabetes Neuro: See HPI Skin: Negative; No rashes or skin lesions Psychiatric: Negative; No behavioral problems, depression Sleep: Negative; No snoring, daytime sleepiness, hypersomnolence, bruxism, restless legs, hypnogognic hallucinations, no cataplexy Other comprehensive 14 point system review is negative.   PHYSICAL EXAM:   VS:  BP 102/62 (BP Location: Right Arm, Patient Position: Sitting, Cuff Size: Normal)   Pulse 64   Ht 5\' 5"  (1.651 m)   Wt 154 lb (69.9 kg)   BMI 25.63 kg/m     Blood pressure by me was 102/64 supine and 100/66 standing  Wt Readings from Last 3 Encounters:  04/05/23 154 lb (69.9 kg)  03/28/23 154 lb 12.8 oz (70.2 kg)  03/04/23 154 lb 6.4 oz (70 kg)    General: Alert, oriented, no distress.  Skin: normal turgor, no rashes, warm and dry HEENT: Normocephalic, atraumatic. Pupils equal round and reactive to light; sclera anicteric; extraocular muscles intact;  Nose without nasal septal hypertrophy Mouth/Parynx benign; Mallinpatti scale 2 Neck: No JVD, no carotid bruits; normal carotid upstroke Lungs: clear to ausculatation and percussion; no wheezing or rales Chest wall: without tenderness to palpitation Heart: PMI not displaced, RRR, s1 s2 normal, 1/6 systolic murmur, no  diastolic murmur, no rubs, gallops, thrills, or heaves Abdomen: soft, nontender; no hepatosplenomehaly, BS+; abdominal aorta nontender and not dilated by palpation. Back: no CVA tenderness Pulses 2+ Musculoskeletal: full range of motion, normal strength, no joint deformities Extremities: no clubbing cyanosis or edema, Homan's sign negative  Neurologic: grossly nonfocal; Cranial nerves grossly wnl Psychologic: Normal mood and affect   Studies/Labs Reviewed:   EKG Interpretation Date/Time:  Tuesday April 05 2023 15:04:34 EST Ventricular Rate:  64 PR Interval:  118 QRS Duration:  104 QT Interval:  402 QTC Calculation: 414 R Axis:   62  Text Interpretation: Normal sinus rhythm Normal ECG No previous ECGs available Confirmed by Nicki Guadalajara ([id number]) on 04/05/2023 3:41:33 PM    Recent Labs:    Latest Ref Rng & Units 02/03/2023   11:42 AM 04/21/2020   11:52 AM  BMP  Glucose 70 - 99 mg/dL 962  78   BUN 6 - 23 mg/dL 15  6   Creatinine 9.52 - 1.20 mg/dL 8.41  3.24   BUN/Creat Ratio 9 - 23  11   Sodium 135 - 145 mEq/L 137  135   Potassium 3.5 - 5.1 mEq/L 3.5  4.4   Chloride 96 - 112 mEq/L 105  100   CO2 19 - 32 mEq/L 27  20   Calcium 8.4 - 10.5 mg/dL 9.3  9.6         Latest Ref Rng & Units 02/03/2023   11:42 AM 04/21/2020   11:52 AM  Hepatic Function  Total Protein 6.0 - 8.3 g/dL 7.8  7.2   Albumin 3.5 - 5.2 g/dL 4.1  4.2   AST 0 - 37 U/L 18  30   ALT 0 - 35 U/L 11  24   Alk Phosphatase 39 - 117 U/L 87  89   Total Bilirubin 0.2 - 1.2 mg/dL 0.6  0.3        Latest Ref Rng & Units 02/03/2023   11:42 AM 05/19/2021   10:42 AM 09/29/2020   10:03 AM  CBC  WBC 4.0 - 10.5 K/uL 8.0  7.0  11.7   Hemoglobin 12.0 - 15.0 g/dL 40.1  02.7  25.3   Hematocrit 36.0 -  46.0 % 34.7  36.8  34.1   Platelets 150.0 - 400.0 K/uL 298.0  279  219    Lab Results  Component Value Date   MCV 81.9 02/03/2023   MCV 84 05/19/2021   MCV 87.0 09/29/2020   Lab Results  Component Value Date   TSH  1.23 02/03/2023   Lab Results  Component Value Date   HGBA1C 5.5 02/18/2023     BNP No results found for: "BNP"  ProBNP No results found for: "PROBNP"   Lipid Panel     Component Value Date/Time   CHOL 114 02/03/2023 1142   CHOL 184 04/21/2020 1152   TRIG 65.0 02/03/2023 1142   HDL 53.40 02/03/2023 1142   HDL 84 04/21/2020 1152   CHOLHDL 2 02/03/2023 1142   VLDL 13.0 02/03/2023 1142   LDLCALC 47 02/03/2023 1142   LDLCALC 83 04/21/2020 1152   LABVLDL 17 04/21/2020 1152     RADIOLOGY: EEG adult  Result Date: 03/29/2023 Van Clines, MD     04/06/2023  9:26 AM ELECTROENCEPHALOGRAM REPORT Date of Study: 03/29/2023 Patient's Name: Olanna Schwerdt MRN: [id number] Date of Birth: [birth year] Referring Provider: Dr. Patrcia Dolly Clinical History: This is a 22 year old woman with syncope, episodes of dizziness. EEG for classification. Medications: Robaxin, Celebrex Technical Summary: A multichannel digital EEG recording measured by the international 10-20 system with electrodes applied with paste and impedances below 5000 ohms performed in our laboratory with EKG monitoring in an awake and asleep patient.  Hyperventilation was not performed. Photic stimulation was performed.  The digital EEG was referentially recorded, reformatted, and digitally filtered in a variety of bipolar and referential montages for optimal display.  Description: The patient is awake and asleep during the recording.  During maximal wakefulness, there is a symmetric, medium voltage 10 Hz posterior dominant rhythm that attenuates with eye opening.  The record is symmetric.  During drowsiness and sleep, there is an increase in theta slowing of the background.  Vertex waves and symmetric sleep spindles were seen. Photic stimulation did not elicit any abnormalities.  There were no epileptiform discharges or electrographic seizures seen.  EKG lead was unremarkable. Impression: This awake and asleep EEG is normal.  Clinical  Correlation: A normal EEG does not exclude a clinical diagnosis of epilepsy.  If further clinical questions remain, prolonged EEG may be helpful.  Clinical correlation is advised. Patrcia Dolly, M.D.     Additional studies/ records that were reviewed today include:   I reviewed the records of Dr. [name] as well as neurologic evaluation of Dr. Patrcia Dolly.   ASSESSMENT:    1. Syncope, unspecified syncope type   2. Empty sella turcica (HCC)   3. Chronic neck pain   4. Anemia, unspecified type     PLAN:  Ms.Kellie Frankenberg is a very pleasant female who is originally from [place] who is unaware of any prior cardiac or neurologic history.  However she recently had experienced an episode of syncope and another episode of presyncope in July/August when temperatures were very hot.  Headaches, dizziness and on MRI imaging she was shown to have partially empty sella, narrowed appearance of distal transverse dural venous sinuses bilaterally and mild cerebellar tonsillar ectopia.  She denies any history of hypertension or awareness of cardiac disease.  At times she does admit to occasional episodes of shortness of breath and some clumsiness.  She had undergone laboratory on February 03, 2023 which revealed mild anemia with a hemoglobin of 11.6 hematocrit  3 4.7.  Glucose was minimally increased at 104.  Potassium was 3.5.  Her blood pressure today is stable but on the low side without significant orthostatic hypotension with supine blood pressure at 102/64 and standing blood pressure 100/66 when taken by me.  ECGs from February 03, 2023 and today are unremarkable and shows normal sinus rhythm with normal intervals and without ectopy.  Her exam is unremarkable.  She is undergoing comprehensive neurologic evaluation with Dr. Karel Jarvis.  From a cardiac perspective, I will schedule her for 2D echo Doppler study to assess systolic and diastolic function.  I will also have her wear a 2-week Zio patch monitor to make  certain there is no significant rhythm abnormalities detected.  Her symptoms may have been related to possible dehydration in a hot setting.  I will contact her regarding the above studies and if essentially normal I will refer her back to her primary and see her as needed in the future if necessary.   Medication Adjustments/Labs and Tests Ordered: Current medicines are reviewed at length with the patient today.  Concerns regarding medicines are outlined above.  Medication changes, Labs and Tests ordered today are listed in the Patient Instructions below. Patient Instructions  Medication Instructions:    *If you need a refill on your cardiac medications before your next appointment, please call your pharmacy*   Lab Work:  If you have labs (blood work) drawn today and your tests are completely normal, you will receive your results only by: MyChart Message (if you have MyChart) OR A paper copy in the mail If you have any lab test that is abnormal or we need to change your treatment, we will call you to review the results.   Testing/Procedures:  Will be schedule at [address] Your physician has requested that you have an echocardiogram. Echocardiography is a painless test that uses sound waves to create images of your heart. It provides your doctor with information about the size and shape of your heart and how well your heart's chambers and valves are working. This procedure takes approximately one hour. There are no restrictions for this procedure. Please do NOT wear cologne, perfume, aftershave, or lotions (deodorant is allowed). Please arrive 15 minutes prior to your appointment time.   And   Will be mailed to you in 3 to 7 days Your physician has recommended that you wear a holter monitor.- 14 day zio  Holter monitors are medical devices that record the heart's electrical activity. Doctors most often use these monitors to diagnose arrhythmias. Arrhythmias are  problems with the speed or rhythm of the heartbeat. The monitor is a small, portable device. You can wear one while you do your normal daily activities. This is usually used to diagnose what is causing palpitations/syncope (passing out).  Please note: We ask at that you not bring children with you during ultrasound (echo/ vascular) testing. Due to room size and safety concerns, children are not allowed in the ultrasound rooms during exams. Our front office staff cannot provide observation of children in our lobby area while testing is being conducted. An adult accompanying a patient to their appointment will only be allowed in the ultrasound room at the discretion of the ultrasound technician under special circumstances. We apologize for any inconvenience.    Follow-Up: At [facility], you and your health needs are our priority.  As part of our continuing mission to provide you with exceptional heart care, we have created designated  Provider Care Teams.  These Care Teams include your primary Cardiologist (physician) and Advanced Practice Providers (APPs -  Physician Assistants and Nurse Practitioners) who all work together to provide you with the care you need, when you need it.     Your next appointment:   As need if test are normal  ( primary Dr Jon Billings  will discuss results)  The format for your next appointment:   In Person  Provider:   Nicki Guadalajara, MD    Other Instructions  ZIO XT- Long Term Monitor Instructions  Your physician has requested you wear a ZIO patch monitor for 14 days.  This is a single patch monitor. [employer] supplies one patch monitor per enrollment. Additional stickers are not available. Please do not apply patch if you will be having a Nuclear Stress Test,  Echocardiogram, Cardiac CT, MRI, or Chest Xray during the period you would be wearing the  monitor. The patch cannot be worn during these tests. You cannot remove and re-apply the  ZIO XT patch monitor.  Your  ZIO patch monitor will be mailed 3 day USPS to your address on file. It may take 3-5 days  to receive your monitor after you have been enrolled.  Once you have received your monitor, please review the enclosed instructions. Your monitor  has already been registered assigning a specific monitor serial # to you.  Billing and Patient Assistance Program Information  We have supplied [employer] with any of your insurance information on file for billing purposes. [employer] offers a sliding scale Patient Assistance Program for patients that do not have  insurance, or whose insurance does not completely cover the cost of the ZIO monitor.  You must apply for the Patient Assistance Program to qualify for this discounted rate.  To apply, please call [employer] at [phone], select option 4, select option 2, ask to apply for  Patient Assistance Program. Meredeth Ide will ask your household income, and how many people  are in your household. They will quote your out-of-pocket cost based on that information.  [employer] will also be able to set up a 16-month, interest-free payment plan if needed.  Applying the monitor   Shave hair from upper left chest.  Hold abrader disc by orange tab. Rub abrader in 40 strokes over the upper left chest as  indicated in your monitor instructions.  Clean area with 4 enclosed alcohol pads. Let dry.  Apply patch as indicated in monitor instructions. Patch will be placed under collarbone on left  side of chest with arrow pointing upward.  Rub patch adhesive wings for 2 minutes. Remove white label marked "1". Remove the white  label marked "2". Rub patch adhesive wings for 2 additional minutes.  While looking in a mirror, press and release button in center of patch. A small green light will  flash 3-4 times. This will be your only indicator that the monitor has been turned on.  Do not shower for the first 24 hours. You may shower after the first 24 hours.  Press the button if you feel  a symptom. You will hear a small click. Record Date, Time and  Symptom in the Patient Logbook.  When you are ready to remove the patch, follow instructions on the last 2 pages of Patient  Logbook. Stick patch monitor onto the last page of Patient Logbook.  Place Patient Logbook in the blue and white box. Use locking tab on box and tape box closed  securely. The blue and white box  has prepaid postage on it. Please place it in the mailbox as  soon as possible. Your physician should have your test results approximately 7 days after the  monitor has been mailed back to [facility].  Call [facility] Customer Care at [phone] if you have questions regarding  your ZIO XT patch monitor. Call them immediately if you see an orange light blinking on your  monitor.  If your monitor falls off in less than 4 days, contact our Monitor department at [phone].  If your monitor becomes loose or falls off after 4 days call [employer] at [phone] for  suggestions on securing your monitor    Signed, Nicki Guadalajara, MD  04/11/2023 12:29 PM    [facility] [address], [address], [place], [place]  [address] Phone: [phone]

## 2023-04-05 NOTE — Progress Notes (Unsigned)
Enrolled patient for a 14 day Zio XT  monitor to be mailed to patients home  °

## 2023-04-06 NOTE — Procedures (Signed)
ELECTROENCEPHALOGRAM REPORT  Date of Study: 03/29/2023  Patient's Name: Jacqueline Miranda MRN: [id number] Date of Birth: [birth year]  Referring Provider: Dr. Patrcia Dolly  Clinical History: This is a 22 year old woman with syncope, episodes of dizziness. EEG for classification.  Medications: Robaxin, Celebrex  Technical Summary: A multichannel digital EEG recording measured by the international 10-20 system with electrodes applied with paste and impedances below 5000 ohms performed in our laboratory with EKG monitoring in an awake and asleep patient.  Hyperventilation was not performed. Photic stimulation was performed.  The digital EEG was referentially recorded, reformatted, and digitally filtered in a variety of bipolar and referential montages for optimal display.    Description: The patient is awake and asleep during the recording.  During maximal wakefulness, there is a symmetric, medium voltage 10 Hz posterior dominant rhythm that attenuates with eye opening.  The record is symmetric.  During drowsiness and sleep, there is an increase in theta slowing of the background.  Vertex waves and symmetric sleep spindles were seen. Photic stimulation did not elicit any abnormalities.  There were no epileptiform discharges or electrographic seizures seen.    EKG lead was unremarkable.  Impression: This awake and asleep EEG is normal.    Clinical Correlation: A normal EEG does not exclude a clinical diagnosis of epilepsy.  If further clinical questions remain, prolonged EEG may be helpful.  Clinical correlation is advised.   Patrcia Dolly, M.D.

## 2023-04-07 ENCOUNTER — Telehealth: Payer: Self-pay

## 2023-04-07 DIAGNOSIS — R55 Syncope and collapse: Secondary | ICD-10-CM

## 2023-04-07 NOTE — Telephone Encounter (Signed)
Pt called an informed  EEG is normal. Proceed with MRV and spinal tap as scheduled,

## 2023-04-07 NOTE — Telephone Encounter (Signed)
-----   Message from Van Clines sent at 04/06/2023 12:27 PM EST ----- Pls let her know EEG is normal. Proceed with MRV and spinal tap as scheduled, thanks

## 2023-04-11 ENCOUNTER — Encounter: Payer: Self-pay | Admitting: Cardiovascular Disease

## 2023-04-12 NOTE — Progress Notes (Signed)
Extended HPI: IIH. Pt has had a MRI of her brain and spine(cervical, thoracic, and Lumbar). Brain showed partial empty sella and Spine showed nothing. Pt reports occasional eye pain and blurry vision. She has HAS a few times a month, is currently on tx. She reports being pre-diabetic, not currently on tx. Pt mentioned having an eye exam in [place] where they started her on gtts and she needed to return for further follow ups but she moved here. She is no longer taking those gtts. Imp/ Plan: 1, Empty Sella Syndrome.  MRI showed partially empty Sella Syndrome.  Sounds like she may have possible pulsatile tinnitus and possible transient obscurations of vision(language barrier). 2. Papilledema OCJ.  Somewhat blurry nasal, superior, and inferior disc margins. Low grade papilledema.  Dr. Jon Billings placed neurology referral already for evaluation of IIHS  Will repeat studies here in 6mos to be sure nothing is changing. 3, Classic Migraine, Not Intractable, without Status Migrainosus.  On tx. Follow Up: Dr. Ernesto Rutherford 6 Months - Extended Exam- 24-2 VF/OCT-N/FP-N,

## 2023-04-13 ENCOUNTER — Ambulatory Visit
Admission: RE | Admit: 2023-04-13 | Discharge: 2023-04-13 | Disposition: A | Discharge status: Home / Self Care | Payer: Medicaid Other | Source: Ambulatory Visit | Attending: Neurology

## 2023-04-13 DIAGNOSIS — E236 Other disorders of pituitary gland: Secondary | ICD-10-CM

## 2023-04-13 DIAGNOSIS — R519 Headache, unspecified: Secondary | ICD-10-CM

## 2023-04-14 NOTE — Discharge Instructions (Signed)

## 2023-04-15 ENCOUNTER — Ambulatory Visit
Admission: RE | Admit: 2023-04-15 | Discharge: 2023-04-15 | Disposition: A | Discharge status: Home / Self Care | Payer: Medicaid Other | Source: Ambulatory Visit | Attending: Neurology | Admitting: Neurology

## 2023-04-15 ENCOUNTER — Other Ambulatory Visit (HOSPITAL_COMMUNITY)
Admission: RE | Admit: 2023-04-15 | Discharge: 2023-04-15 | Disposition: A | Discharge status: Home / Self Care | Payer: Medicaid Other | Source: Ambulatory Visit | Attending: Neurology | Admitting: Neurology

## 2023-04-15 VITALS — BP 104/62 | HR 62

## 2023-04-15 DIAGNOSIS — R519 Headache, unspecified: Secondary | ICD-10-CM | POA: Diagnosis present

## 2023-04-15 DIAGNOSIS — H538 Other visual disturbances: Secondary | ICD-10-CM | POA: Diagnosis present

## 2023-04-15 DIAGNOSIS — E236 Other disorders of pituitary gland: Secondary | ICD-10-CM | POA: Diagnosis present

## 2023-04-15 LAB — PROTEIN, CSF: Total Protein, CSF: 19 mg/dL (ref 15–45)

## 2023-04-15 LAB — CSF CELL COUNT WITH DIFFERENTIAL
RBC Count, CSF: 0 {cells}/uL
TOTAL NUCLEATED CELL: 0 {cells}/uL (ref 0–5)

## 2023-04-15 LAB — GLUCOSE, CSF: Glucose, CSF: 62 mg/dL (ref 40–80)

## 2023-04-18 LAB — CYTOLOGY - NON PAP

## 2023-04-20 ENCOUNTER — Telehealth: Payer: Self-pay | Admitting: Neurology

## 2023-04-20 MED ORDER — ACETAZOLAMIDE 250 MG PO TABS: 250.0000 mg | ORAL_TABLET | Freq: Two times a day (BID) | ORAL | 5 refills | Status: DC

## 2023-04-20 NOTE — Telephone Encounter (Signed)
Arabic interpreter contacted patient Jacqueline Miranda [phone]). She saw eye doctor Dr. [name] 11/7 and told she has swelling in both eyes (notes reviewed, low grade papilledema OU). LP done 11/15 showed elevated OP of 30 cmH2O. Discussed starting Diamox 250mg  BID, side effects discussed. Rx sent to pharmacy. Proceed with MRV as scheduled. Follow-up as scheduled in Jan.

## 2023-05-03 ENCOUNTER — Encounter
Payer: Medicaid Other | Attending: Physical Medicine and Rehabilitation | Admitting: Physical Medicine and Rehabilitation

## 2023-05-03 ENCOUNTER — Encounter: Payer: Self-pay | Admitting: Physical Medicine and Rehabilitation

## 2023-05-03 VITALS — BP 111/77 | HR 77 | Ht 65.0 in | Wt 152.0 lb

## 2023-05-03 DIAGNOSIS — G8929 Other chronic pain: Secondary | ICD-10-CM

## 2023-05-03 DIAGNOSIS — M545 Low back pain, unspecified: Secondary | ICD-10-CM | POA: Diagnosis not present

## 2023-05-03 DIAGNOSIS — M542 Cervicalgia: Secondary | ICD-10-CM | POA: Diagnosis present

## 2023-05-03 NOTE — Progress Notes (Signed)
Subjective:    Patient ID: Jacqueline Miranda, female    DOB: [birth year], 22 y.o.   MRN: [id number]  HPI Jacqueline Miranda is 22 year old woman who presents to establish care for low back pain.  1) Chronic low back pain -started 2 years ago when she delivered her baby -went to gym and therapy and these did not help -she has tried tylenol and this did not help -she has tried ibuprofen and this did not help -has tried hot water and ice and these have helped -she has not tried tens unit  2) Neck pain -present since she delivered her baby  Pain Inventory Average Pain 8 Pain Right Now 6 My pain is sharp and aching  In the last 24 hours, has pain interfered with the following? General activity 7 Relation with others 8 Enjoyment of life 7 What TIME of day is your pain at its worst? daytime, evening, and night Sleep (in general) Poor  Pain is worse with: walking, bending, sitting, inactivity, standing, and some activites Pain improves with:  nothing Relief from Meds: 0  walk without assistance how many minutes can you walk? 30 ability to climb steps?  yes do you drive?  yes    weakness dizziness  Any changes since last visit?  no  Any changes since last visit?  no    Family History  Problem Relation Age of Onset   Hepatitis B Father    Asthma Mother    Social History   Socioeconomic History   Marital status: Single    Spouse name: Not on file   Number of children: Not on file   Years of education: Not on file   Highest education level: GED or equivalent  Occupational History   Not on file  Tobacco Use   Smoking status: Never   Smokeless tobacco: Never  Vaping Use   Vaping status: Never Used  Substance and Sexual Activity   Alcohol use: Never   Drug use: Never   Sexual activity: Not Currently  Other Topics Concern   Not on file  Social History Narrative   Father is Arob Scientist, physiological   Mother is [employer] Care[employer] information officer       Refugee Information   Number of Immediate Family  Members: 8   Number of Immediate Family Members in [place]: 8   Date of Arrival: 02/12/20   Country of Birth: [place]   Other Country of Origin:: [place]   Location of Refugee [place]: Other   Other Location of Refugee Camp:: [place]   Duration in [place]: 11-15 years   Reason for Leaving Home Country: Religion   Primary Language: Other   Other Primary Language:: Dinka   Able to Read in Primary Language: No   Able to Write in Primary Language: No   Education: Primary School   Marital Status: Single   Tuberculosis Screening [place]verse[place]: Negative   Health Department Labs Completed: Yes   Do You Feel Jumpy or Nervous?: No   Are You Very Watchful or 'Super Alert'?: No   Social Determinants of Health   Financial Resource Strain: High Risk (02/17/2023)   Overall Financial Resource Strain (CARDIA)    Difficulty of Paying Living Expenses: Hard  Food Insecurity: No Food Insecurity (02/17/2023)   Hunger Vital Sign    Worried About Running Out of Food in the Last Year: Never true    Ran Out of Food in the Last Year: Never true  Transportation Needs: Unmet Transportation Needs (02/17/2023)  PRAPARE - Administrator, Civil Service (Medical): No    Lack of Transportation (Non-Medical): Yes  Physical Activity: Insufficiently Active (02/17/2023)   Exercise Vital Sign    Days of Exercise per Week: 3 days    Minutes of Exercise per Session: 30 min  Stress: No Stress Concern Present (02/17/2023)   [facility] [employer] - Occupational Stress Questionnaire    Feeling of Stress : Not at all  Social Connections: Socially Integrated (02/17/2023)   Social Connection and Isolation Panel [NHANES]    Frequency of Communication with Friends and Family: More than three times a week    Frequency of Social Gatherings with Friends and Family: Twice a week    Attends Religious Services: More than 4 times per year    Active Member of Clubs or Organizations: Yes    Attends Hospital doctor: More than 4 times per year    Marital Status: Living with partner   Past Surgical History:  Procedure Laterality Date   TONSILLECTOMY     Past Medical History:  Diagnosis Date   Anemia    Anemia in pregnancy 06/28/2020   Rx iron     Toothache 04/21/2020   BP 111/77   Pulse 77   Ht 5\' 5"  (1.651 m)   Wt 152 lb (68.9 kg)   SpO2 99%   BMI 25.29 kg/m   Opioid Risk Score:   Fall Risk Score:  `1  Depression screen PHQ 2/9     05/03/2023    1:53 PM 03/04/2023    9:51 AM 02/18/2023    8:57 AM 03/29/2022    1:46 PM 05/19/2021   10:03 AM 09/22/2020   11:08 AM 09/11/2020    9:13 AM  Depression screen PHQ 2/9  Decreased Interest 0 0 0 0 0 1 0  Down, Depressed, Hopeless 2 0 0 0 0 0 3  PHQ - 2 Score 2 0 0 0 0 1 3  Altered sleeping 2 0 0  3 0 0  Tired, decreased energy 3 1 2 2 1 1 1   Change in appetite 0 0 0 0 0 0 0  Feeling bad or failure about yourself  0 0 0 0 0 0 0  Trouble concentrating 0 0 0 0 0 0 0  Moving slowly or fidgety/restless 0 0 0 0 0 0 0  Suicidal thoughts 0 0 0 0 0 0 0  PHQ-9 Score 7 1 2  4 2 4   Difficult doing work/chores  Somewhat difficult Not difficult at all         Review of Systems  Constitutional:  Positive for unexpected weight change.       Gained weight  HENT: Negative.    Eyes: Negative.   Respiratory:  Positive for shortness of breath.   Cardiovascular: Negative.   Gastrointestinal:  Positive for constipation.  Endocrine: Negative.   Genitourinary: Negative.   Musculoskeletal:  Positive for back pain and neck pain.  Skin: Negative.   Allergic/Immunologic: Negative.   Neurological:  Positive for dizziness and weakness.  Hematological: Negative.   Psychiatric/Behavioral:  Positive for dysphoric mood.   All other systems reviewed and are negative.      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  -Discussed benefits of exercise in reducing pain. -Discussed following foods that may reduce pain: 1) Ginger (especially studied for arthritis)- reduce leukotriene production to decrease inflammation 2) Blueberries- high in phytonutrients that decrease inflammation 3) Salmon- marine omega-3s reduce joint swelling and pain 4) Pumpkin seeds- reduce inflammation 5) dark chocolate- reduces inflammation 6) turmeric- reduces inflammation 7) tart cherries - reduce pain and stiffness 8) extra virgin olive oil - its compound olecanthal helps to block prostaglandins  9) chili peppers- can be eaten or applied topically via capsaicin 10) mint- helpful for headache, muscle aches, joint pain, and itching 11) garlic- reduces inflammation  Link to further information on diet for chronic pain: [email or web address].[email or web address]/    2) Chronic neck pain -discussed XR  Prescribing Home Zynex NexWave Stimulator Device and supplies as needed. IFC, NMES and TENS medically necessary Treatment Rx: Daily @ 30-40 minutes per treatment PRN. Zynex NexWave only, no substitutions. Treatment Goals: 1) To reduce and/or eliminate pain 2) To improve functional capacity and Activities of daily living 3) To reduce or prevent the need for oral medications 4) To improve circulation in the injured region 5) To decrease or prevent muscle spasm and muscle atrophy 6) To provide a self-management tool to the patient The patient has not sufficiently improved with conservative care. Numerous studies indexed by  Medline and PubMed.gov have shown Neuromuscular, Interferential, and TENS stimulators to reduce pain, improve function, and reduce medication use in injured patients. Continued use of this evidence based, safe, drug free treatment is both reasonable and medically necessary at this time.

## 2023-05-05 ENCOUNTER — Inpatient Hospital Stay: Admission: RE | Admit: 2023-05-05 | Payer: Medicaid Other | Source: Ambulatory Visit

## 2023-05-05 ENCOUNTER — Encounter: Payer: Self-pay | Admitting: Neurology

## 2023-05-05 ENCOUNTER — Ambulatory Visit
Admission: RE | Admit: 2023-05-05 | Discharge: 2023-05-05 | Disposition: A | Discharge status: Home / Self Care | Payer: Medicaid Other | Source: Ambulatory Visit | Attending: Neurology | Admitting: Neurology

## 2023-05-05 ENCOUNTER — Other Ambulatory Visit: Payer: Self-pay

## 2023-05-05 DIAGNOSIS — R55 Syncope and collapse: Secondary | ICD-10-CM

## 2023-05-05 DIAGNOSIS — M549 Dorsalgia, unspecified: Secondary | ICD-10-CM

## 2023-05-05 DIAGNOSIS — R519 Headache, unspecified: Secondary | ICD-10-CM

## 2023-05-05 DIAGNOSIS — R200 Anesthesia of skin: Secondary | ICD-10-CM

## 2023-05-05 DIAGNOSIS — G8929 Other chronic pain: Secondary | ICD-10-CM

## 2023-05-05 DIAGNOSIS — E236 Other disorders of pituitary gland: Secondary | ICD-10-CM

## 2023-05-10 ENCOUNTER — Ambulatory Visit (HOSPITAL_COMMUNITY): Payer: Medicaid Other | Attending: Cardiovascular Disease

## 2023-05-10 DIAGNOSIS — R55 Syncope and collapse: Secondary | ICD-10-CM | POA: Diagnosis present

## 2023-05-10 LAB — ECHOCARDIOGRAM COMPLETE
Area-P 1/2: 3.31 cm2
S' Lateral: 3.4 cm

## 2023-06-10 ENCOUNTER — Encounter: Payer: Self-pay | Admitting: Internal Medicine

## 2023-06-10 ENCOUNTER — Ambulatory Visit (INDEPENDENT_AMBULATORY_CARE_PROVIDER_SITE_OTHER): Payer: Medicaid Other | Admitting: Internal Medicine

## 2023-06-10 VITALS — BP 86/68 | HR 64 | Temp 98.1°F | Ht 65.0 in | Wt 145.6 lb

## 2023-06-10 DIAGNOSIS — I9589 Other hypotension: Secondary | ICD-10-CM | POA: Diagnosis not present

## 2023-06-10 DIAGNOSIS — R1084 Generalized abdominal pain: Secondary | ICD-10-CM | POA: Diagnosis not present

## 2023-06-10 DIAGNOSIS — Z5987 Material hardship due to limited financial resources, not elsewhere classified: Secondary | ICD-10-CM | POA: Diagnosis not present

## 2023-06-10 DIAGNOSIS — K047 Periapical abscess without sinus: Secondary | ICD-10-CM

## 2023-06-10 DIAGNOSIS — M545 Low back pain, unspecified: Secondary | ICD-10-CM

## 2023-06-10 LAB — COMPREHENSIVE METABOLIC PANEL
ALT: 11 U/L (ref 0–35)
AST: 15 U/L (ref 0–37)
Albumin: 4.7 g/dL (ref 3.5–5.2)
Alkaline Phosphatase: 104 U/L (ref 39–117)
BUN: 17 mg/dL (ref 6–23)
CO2: 21 meq/L (ref 19–32)
Calcium: 9.6 mg/dL (ref 8.4–10.5)
Chloride: 108 meq/L (ref 96–112)
Creatinine, Ser: 0.75 mg/dL (ref 0.40–1.20)
GFR: 111.11 mL/min (ref >60.00)
Glucose, Bld: 97 mg/dL (ref 70–99)
Potassium: 3.4 meq/L — ABNORMAL LOW (ref 3.5–5.1)
Sodium: 138 meq/L (ref 135–145)
Total Bilirubin: 0.7 mg/dL (ref 0.2–1.2)
Total Protein: 8.5 g/dL — ABNORMAL HIGH (ref 6.0–8.3)

## 2023-06-10 LAB — CBC WITH DIFFERENTIAL/PLATELET
Basophils Absolute: 0.1 10*3/uL (ref 0.0–0.1)
Basophils Relative: 0.7 % (ref 0.0–3.0)
Eosinophils Absolute: 0.3 10*3/uL (ref 0.0–0.7)
Eosinophils Relative: 3 % (ref 0.0–5.0)
HCT: 35.7 % — ABNORMAL LOW (ref 36.0–46.0)
Hemoglobin: 11.7 g/dL — ABNORMAL LOW (ref 12.0–15.0)
Lymphocytes Relative: 23 % (ref 12.0–46.0)
Lymphs Abs: 2 10*3/uL (ref 0.7–4.0)
MCHC: 32.8 g/dL (ref 30.0–36.0)
MCV: 83 fL (ref 78.0–100.0)
Monocytes Absolute: 0.6 10*3/uL (ref 0.1–1.0)
Monocytes Relative: 7.2 % (ref 3.0–12.0)
Neutro Abs: 5.8 10*3/uL (ref 1.4–7.7)
Neutrophils Relative %: 66.1 % (ref 43.0–77.0)
Platelets: 310 10*3/uL (ref 150.0–400.0)
RBC: 4.31 Mil/uL (ref 3.87–5.11)
RDW: 13.9 % (ref 11.5–15.5)
WBC: 8.8 10*3/uL (ref 4.0–10.5)

## 2023-06-10 LAB — SEDIMENTATION RATE: Sed Rate: 48 mm/h — ABNORMAL HIGH (ref 0–20)

## 2023-06-10 LAB — C-REACTIVE PROTEIN: CRP: <1 mg/dL (ref 0.5–20.0)

## 2023-06-10 LAB — HEMOGLOBIN A1C: Hgb A1c MFr Bld: 5.8 % (ref 4.6–6.5)

## 2023-06-10 MED ORDER — AMOXICILLIN-POT CLAVULANATE 875-125 MG PO TABS
1.0000 | ORAL_TABLET | Freq: Two times a day (BID) | ORAL | 0 refills | Status: DC
Start: 2023-06-10 — End: 2023-11-21

## 2023-06-10 NOTE — Progress Notes (Signed)
 ==============================  [facility]: [phone]   -- Medical Office Visit --  Patient: Jacqueline Miranda      Age: 23 y.o.       Sex:  female  Date:   06/10/2023 Today's Healthcare Provider: Bernardino KANDICE Cone, MD  ==============================     CHIEF COMPLAINT: 3 month follow-up   SUBJECTIVE: 23 y.o. female who has Leg pain, bilateral; Blurred vision; Positive RPR test; Acute reaction to situational stress; Back pain; Stomach pain; Syncope; Migraine; Iron deficiency anemia; Impaired fasting glucose; Chronic neck pain; Scoliosis; IIH (idiopathic intracranial hypertension); and Empty sella turcica (HCC) on their problem list.  History of Present Illness The patient, with a history of low blood pressure and recent childbirth, presents with complaints of persistent toothache and associated swelling. The tooth issue began after a filling procedure in 2021, which subsequently fell out. The patient also reports episodes of dizziness, headaches, and fainting, with one such episode occurring in September. An MRI conducted at the time showed no abnormalities. The patient was referred to cardiology due to these symptoms and low blood pressure, but cardiac investigations were normal.  The patient also reports intermittent abdominal pain and changes in bowel habits, alternating between constipation and diarrhea, since childbirth. Additionally, the patient experiences occasional rapid heartbeat and shortness of breath, described as feeling as if they had been running. The patient also mentions persistent lower back and side pain, which they attribute to a lumbar puncture procedure performed during childbirth.  The patient has financial and employment concerns, which have prevented them from seeking dental care for the tooth issue. They express concern about the cost of treatment, as they are currently not working. The patient also mentions studying for an English  test, indicating a possible language barrier.  The patient is concerned about the potential side effects of antibiotics, specifically questioning the strength of the proposed medication and the risk of developing clostridium difficile colitis. They also express concern about the volume of blood required for proposed lab work.  Past Medical History - Tooth problem since 2021 - History of passing out - Low blood pressure - Lightheadedness - Constipation after delivery - Diarrhea  Note that patient  has a past medical history of Anemia, Anemia in pregnancy (06/28/2020), and Toothache (04/21/2020).  Problem list overviews that were updated at today's visit:No problems updated.  Med reconciliation: Current Outpatient Medications on File Prior to Visit  Medication Sig   acetaminophen  (TYLENOL ) 325 MG tablet Take 2 tablets (650 mg total) by mouth every 4 (four) hours as needed (for pain scale < 4).   acetaZOLAMIDE  (DIAMOX ) 250 MG tablet Take 1 tablet (250 mg total) by mouth 2 (two) times daily.   celecoxib  (CELEBREX ) 200 MG capsule Take 1 capsule (200 mg total) by mouth 2 (two) times daily.   methocarbamol  (ROBAXIN ) 500 MG tablet Take 1 tablet (500 mg total) by mouth 4 (four) times daily.   No current facility-administered medications on file prior to visit.  There are no discontinued medications.    Objective   Physical Exam     06/10/2023    9:49 AM 06/10/2023    9:35 AM 05/03/2023    1:52 PM  Vitals with BMI  Height  5' 5 5' 5  Weight  145 lbs 10 oz 152 lbs  BMI  24.23 25.29  Systolic 86 80 111  Diastolic 68 68 77  Pulse  64 77   Wt Readings from Last 10 Encounters:  06/10/23  145 lb 9.6 oz (66 kg)  05/03/23 152 lb (68.9 kg)  04/05/23 154 lb (69.9 kg)  03/28/23 154 lb 12.8 oz (70.2 kg)  03/04/23 154 lb 6.4 oz (70 kg)  02/18/23 153 lb 6 oz (69.6 kg)  02/03/23 160 lb 6.4 oz (72.8 kg)  03/29/22 160 lb 2 oz (72.6 kg)  05/19/21 153 lb 6.4 oz (69.6 kg)  09/22/20 145 lb 9.6 oz  (66 kg)   Vital signs reviewed.  Nursing notes reviewed. Weight trend reviewed. Abnormalities and Problem-Specific physical exam findings:  easily sit to stand without loss of balance  General Appearance:  No acute distress appreciable.   Well-groomed, healthy-appearing female.  Well proportioned with no abnormal fat distribution.  Good muscle tone. Pulmonary:  Normal work of breathing at rest, no respiratory distress apparent. SpO2: 99 %  Musculoskeletal: All extremities are intact.  Neurological:  Awake, alert, oriented, and engaged.  No obvious focal neurological deficits or cognitive impairments.  Sensorium seems unclouded.   Speech is clear and coherent with logical content. Psychiatric:  Appropriate mood, pleasant and cooperative demeanor, thoughtful and engaged during the exam    No results found for any visits on 06/10/23. Appointment on 05/10/2023  Component Date Value   Area-P 1/2 05/10/2023 3.31    S' Lateral 05/10/2023 3.40    Est EF 05/10/2023 60 - 65%   Hospital Outpatient Visit on 04/15/2023  Component Date Value   CYTOLOGY - NON GYN 04/15/2023                     Value:CYTOLOGY - NON PAP CASE: [id number] PATIENT: LEMUEL DREDGE Non-Gynecological Cytology Report     Clinical History: Blurred vision, Empty sella turcica, Nonintractable episodic headache, unspecified headache type Specimen Submitted:  A. CEREBRAL SPINAL FLUID, SPINAL TAP:   FINAL MICROSCOPIC DIAGNOSIS: - No malignant cells identified  SPECIMEN ADEQUACY: Satisfactory for evaluation  GROSS: Received is/are: 7cc's of clear, colorless fluid. (MW:mw) Smears: 0 Concentration Method (Thin Prep): 1 Cell Block: 0 Additional Studies: N/A     Final Diagnosis performed by Ilsa Pottier, MD.   Electronically signed 04/18/2023 Technical component performed at [employer], [address], [place], [place] [address].  Professional component performed at [facility], [address], [place], [place] [address].  Immunohistochemistry Technical component (if applicable) was performed at [facility]. [address], [place], [place] [address].   IMMUNOHISTOCHEMISTRY DISCLAIMER (if applicable): Some of these immunohistochemical stains may have been developed and the performance characteristics determine by [facility]. Some may not have been cleared or approved by the U.S. Food and Drug Administration. The FDA has determined that such clearance or approval is not necessary. This test is used for clinical purposes. It should not be regarded as investigational or for research. This laboratory is certified under the Clinical Laboratory Improvement Amendments of 1988 (CLIA-88) as qualified to perform high complexity clinical laboratory testing.  The controls stained appropriately.   IHC stains are performed on formalin fixed, paraffin embedded tissue using a 3,3diaminobenzidine (DAB) chromogen and Leica Bond Autostainer System. The staining intensity of the nucleus is score manually and is reported as the percentage of tumor cell nuclei demonstrating specific nuclear  staining. The specimens are fixed in 10% Neutral Formalin for at least 6 hours and up to 72hrs. These tests are validated on decalcified tissue. Results should be interpreted with caution given the possibility of false negative results on decalcified specimens. Antibody Clones are as follows ER-clone 3F, PR-clone 16, Ki67- clone MM1. Some of these immunohistochemical stains may have been developed and the performance characteristics determined by [facility].    Total Protein, CSF 04/15/2023 19    Color, CSF 04/15/2023 COLORLESS    Appearance, CSF 04/15/2023 CLEAR    RBC Count, CSF 04/15/2023 0    TOTAL NUCLEATED CELL 04/15/2023 0    Segmented Neutrophils-CSF 04/15/2023 CANCELED     Comment: 04/15/2023     Glucose, CSF 04/15/2023 62   Lab on 03/28/2023  Component Date Value   Vitamin B-12 03/28/2023 789    Anti Nuclear Antibody (A* 03/28/2023 NEGATIVE    CRP 03/28/2023 <1.0    Sed Rate 03/28/2023 33 (H)   Office Visit on 02/18/2023  Component Date Value   Hemoglobin A1C 02/18/2023 5.5   Office Visit on 02/03/2023  Component Date Value   Cholesterol 02/03/2023 114    Triglycerides 02/03/2023 65.0    HDL 02/03/2023 53.40    VLDL 02/03/2023 13.0    LDL Cholesterol 02/03/2023 47    Total CHOL/HDL Ratio 02/03/2023 2    NonHDL 02/03/2023 60.33    TSH 02/03/2023 1.23    Sodium 02/03/2023 137    Potassium 02/03/2023 3.5    Chloride 02/03/2023 105    CO2 02/03/2023 27    Glucose, Bld 02/03/2023 104 (H)    BUN 02/03/2023 15    Creatinine, Ser 02/03/2023 0.56    Total Bilirubin 02/03/2023 0.6    Alkaline Phosphatase 02/03/2023 87    AST 02/03/2023 18    ALT 02/03/2023 11    Total Protein 02/03/2023 7.8    Albumin 02/03/2023 4.1    GFR 02/03/2023 127.68    Calcium 02/03/2023 9.3    WBC 02/03/2023 8.0    RBC 02/03/2023 4.24    Hemoglobin 02/03/2023 11.6 (L)    HCT 02/03/2023 34.7 (L)    MCV 02/03/2023 81.9    MCHC 02/03/2023 33.5    RDW 02/03/2023 13.3    Platelets 02/03/2023 298.0    Neutrophils Relative % 02/03/2023 50.1    Lymphocytes Relative 02/03/2023 34.8    Monocytes Relative 02/03/2023 9.5    Eosinophils Relative 02/03/2023 5.0    Basophils Relative 02/03/2023 0.6    Neutro Abs 02/03/2023 4.0    Lymphs Abs 02/03/2023 2.8    Monocytes Absolute 02/03/2023 0.8    Eosinophils Absolute 02/03/2023 0.4    Basophils Absolute 02/03/2023 0.0    Troponin I 02/03/2023 <3    Alcohol Metabolites 02/03/2023 NEGATIVE    Amphetamines 02/03/2023 NEGATIVE    Benzodiazepines 02/03/2023 NEGATIVE    Buprenorphine, Urine 02/03/2023 NEGATIVE    Cocaine Metabolite 02/03/2023 NEGATIVE    6 Acetylmorphine 02/03/2023 NEGATIVE    Marijuana Metabolite 02/03/2023  NEGATIVE    MDMA 02/03/2023 NEGATIVE    Opiates 02/03/2023 NEGATIVE    Oxycodone  02/03/2023 NEGATIVE    Creatinine 02/03/2023 117.8    pH 02/03/2023 7.8    Oxidant 02/03/2023 NEGATIVE    Preg Test, Ur 02/03/2023 NEGATIVE    Color, Urine 02/03/2023 YELLOW    APPearance 02/03/2023 CLOUDY (A)    Specific Gravity, Urine 02/03/2023 1.023    pH 02/03/2023 7.5    Glucose, UA 02/03/2023 NEGATIVE    Bilirubin Urine 02/03/2023  NEGATIVE    Ketones, ur 02/03/2023 NEGATIVE    Hgb urine dipstick 02/03/2023 NEGATIVE    Protein, ur 02/03/2023 NEGATIVE    Nitrite 02/03/2023 NEGATIVE    Leukocytes,Ua 02/03/2023 NEGATIVE    Notes and Comments 02/03/2023    No image results found. MR ANGIO HEAD WO CONTRAST Result Date: 05/14/2023 CLINICAL DATA:  Initial evaluation for syncope, headaches. EXAM: MRA HEAD WITHOUT CONTRAST TECHNIQUE: Angiographic images of the Circle of Willis were acquired using MRA technique without intravenous contrast. COMPARISON:  Comparison made with prior brain MRI from 02/15/2023 FINDINGS: Anterior circulation: Both internal carotid arteries are widely patent through the siphons without stenosis or other abnormality. A1 segments patent bilaterally. Normal anterior communicating artery complex. Anterior cerebral arteries widely patent without stenosis. No M1 stenosis or occlusion. Normal MCA bifurcations. Distal MCA branches perfused and symmetric. No beading or irregularity. Posterior circulation: Both V4 segments patent without stenosis. Vertebral arteries are largely codominant. Neither PICA origin visualized. Basilar widely patent without stenosis. Superior cerebral arteries patent bilaterally. Both PCAs primarily supplied via the basilar well perfused or distal aspects without stenosis. No beading or irregularity. Anatomic variants: None significant. Other: No intracranial aneurysm or other vascular abnormality. IMPRESSION: Normal intracranial MRA. Electronically Signed   By: Morene Hoard M.D.   On: 05/14/2023 20:37   ECHOCARDIOGRAM COMPLETE Result Date: 05/10/2023    ECHOCARDIOGRAM REPORT   Patient Name:   TYSHAE STAIR     Date of Exam: 05/10/2023 Medical Rec #:  [id number]     Height:       65.0 in Accession #:    [id number]    Weight:       152.0 lb Date of Birth:  January 08, 2001     BSA:          1.760 m Patient Age:    22 years      BP:           111/77 mmHg Patient Gender: F             HR:           59 bpm. Exam Location:  [address] Procedure: 2D Echo, Cardiac Doppler, Color Doppler, 3D Echo and Strain Analysis Indications:    Syncope R55  History:        Patient has no prior history of Echocardiogram examinations.  Sonographer:    Augustin Seals RDCS Referring Phys: [phone] THOMAS A KELLY IMPRESSIONS  1. Left ventricular ejection fraction, by estimation, is 60 to 65%. The left ventricle has normal function. The left ventricle has no regional wall motion abnormalities. Left ventricular diastolic parameters were normal. The average left ventricular global longitudinal strain is -22.0 %. The global longitudinal strain is normal.  2. Right ventricular systolic function is normal. The right ventricular size is mildly enlarged. There is normal pulmonary artery systolic pressure.  3. The mitral valve is normal in structure. No evidence of mitral valve regurgitation. No evidence of mitral stenosis.  4. The aortic valve is normal in structure. Aortic valve regurgitation is not visualized. No aortic stenosis is present.  5. The inferior vena cava is normal in size with greater than 50% respiratory variability, suggesting right atrial pressure of 3 mmHg. Comparison(s): No prior Echocardiogram. FINDINGS  Left Ventricle: Left ventricular ejection fraction, by estimation, is 60 to 65%. The left ventricle has normal function. The left ventricle has no regional wall motion abnormalities. The average left ventricular global longitudinal strain is -22.0 %. The global longitudinal strain is normal.  The left ventricular internal cavity size was normal in size. There is no left ventricular hypertrophy. Left ventricular diastolic parameters were normal. Right Ventricle: The right ventricular size is mildly enlarged. No increase in right ventricular wall thickness. Right ventricular systolic function is normal. There is normal pulmonary artery systolic pressure. The tricuspid regurgitant velocity is 1.84  m/s, and with an assumed right atrial pressure of 3 mmHg, the estimated right ventricular systolic pressure is 16.5 mmHg. Left Atrium: Left atrial size was normal in size. Right Atrium: Right atrial size was normal in size. Pericardium: There is no evidence of pericardial effusion. Mitral Valve: The mitral valve is normal in structure. No evidence of mitral valve regurgitation. No evidence of mitral valve stenosis. Tricuspid Valve: The tricuspid valve is normal in structure. Tricuspid valve regurgitation is trivial. No evidence of tricuspid stenosis. Aortic Valve: The aortic valve is normal in structure. Aortic valve regurgitation is not visualized. No aortic stenosis is present. Pulmonic Valve: The pulmonic valve was normal in structure. Pulmonic valve regurgitation is mild. No evidence of pulmonic stenosis. Aorta: The aortic root is normal in size and structure. Venous: The inferior vena cava is normal in size with greater than 50% respiratory variability, suggesting right atrial pressure of 3 mmHg. IAS/Shunts: No atrial level shunt detected by color flow Doppler.  LEFT VENTRICLE PLAX 2D LVIDd:         4.80 cm   Diastology LVIDs:         3.40 cm   LV e' medial:    12.30 cm/s LV PW:         0.80 cm   LV E/e' medial:  7.9 LV IVS:        0.70 cm   LV e' lateral:   18.00 cm/s LVOT diam:     2.10 cm   LV E/e' lateral: 5.4 LV SV:         49 LV SV Index:   28        2D Longitudinal Strain LVOT Area:     3.46 cm  2D Strain GLS Avg:     -22.0 %                           3D Volume EF:                          3D EF:         60 %                          LV EDV:       140 ml                          LV ESV:       56 ml                          LV SV:        83 ml RIGHT VENTRICLE RV Basal diam:  4.70 cm RV Mid diam:    4.50 cm RV S prime:     14.70 cm/s TAPSE (M-mode): 2.8 cm LEFT ATRIUM             Index        RIGHT ATRIUM           Index  LA diam:        3.20 cm 1.82 cm/m   RA Area:     15.30 cm LA Vol (A2C):   35.6 ml 20.22 ml/m  RA Volume:   39.50 ml  22.44 ml/m LA Vol (A4C):   34.6 ml 19.66 ml/m LA Biplane Vol: 36.7 ml 20.85 ml/m  AORTIC VALVE LVOT Vmax:   76.00 cm/s LVOT Vmean:  49.300 cm/s LVOT VTI:    0.141 m  AORTA Ao Root diam: 2.30 cm Ao Asc diam:  2.80 cm MITRAL VALVE               TRICUSPID VALVE MV Area (PHT): 3.31 cm    TR Peak grad:   13.5 mmHg MV Decel Time: 229 msec    TR Vmax:        184.00 cm/s MV E velocity: 97.50 cm/s MV A velocity: 42.40 cm/s  SHUNTS MV E/A ratio:  2.30        Systemic VTI:  0.14 m                            Systemic Diam: 2.10 cm Sunit Tolia Electronically signed by Madonna Large Signature Date/Time: 05/10/2023/6:37:09 PM    Final    LONG TERM MONITOR (3-14 DAYS) Result Date: 05/09/2023 Patch Wear Time:  13 days and 23 hours (2024-11-07T21:21:29-0500 to 2024-11-21T21:21:25-0500) Patient had a min HR of 47 bpm, max HR of 173 bpm, and avg HR of 78 bpm. Predominant underlying rhythm was Sinus Rhythm. 1 run of Supraventricular Tachycardia occurred lasting 5 beats with a max rate of 128 bpm (avg 125 bpm). Isolated SVEs were rare (<1.0%), SVE Couplets were rare (<1.0%), and no SVE Triplets were present. Isolated VEs were rare (<1.0%), and no VE Couplets or VE Triplets were present. The predominant rhythm was sinus rhythm with an average rate at 78 bpm.  The slowest heart rate was sinus bradycardia which occurred at 3:05 AM while sleeping.  The fastest heart rate was sinus tachycardia at 178 bpm.  There was 1 episode of 5 beats of SVT with an average rate at 125.  There were rare isolated PACs  and atrial couplets, and very rare isolated PVCs.  There were no episodes of atrial fibrillation or pauses.   DG FL GUIDED LUMBAR PUNCTURE Result Date: 04/15/2023 CLINICAL DATA:  Headaches, dizziness, vision changes for the past 2 months. Concern for idiopathic intracranial hypertension. EXAM: DIAGNOSTIC LUMBAR PUNCTURE UNDER FLUOROSCOPIC GUIDANCE COMPARISON:  None Available. FLUOROSCOPY: Radiation Exposure Index (as provided by the fluoroscopic device): 1.7 mGy Kerma PROCEDURE: Informed consent was obtained from the patient prior to the procedure, including potential complications of headache, allergy, and pain. With the patient prone, the lower back was prepped with Betadine. 1% Lidocaine  was used for local anesthesia. Lumbar puncture was performed at the L3-L4 level via a left interlaminar approach using a 3.5 inch 20 gauge needle with return of clear, colorless CSF with an opening pressure of 30 cm water, measured in the left lateral decubitus position. 20 ml of CSF were obtained for laboratory studies. Closing pressure was 10 cm water. The patient tolerated the procedure well and there were no apparent complications. IMPRESSION: 1. Technically successful fluoroscopically guided lumbar puncture. 2. Elevated opening CSF pressure consistent with idiopathic intracranial hypertension. Electronically Signed   By: Elsie ONEIDA Shoulder M.D.   On: 04/15/2023 14:05   EEG adult Result Date: 03/29/2023 Georjean Darice HERO, MD  04/06/2023  9:26 AM ELECTROENCEPHALOGRAM REPORT Date of Study: 03/29/2023 Patient's Name: Michaella Imai MRN: [id number] Date of Birth: 2000/07/20 Referring Provider: Dr. Darice Shivers Clinical History: This is a 23 year old woman with syncope, episodes of dizziness. EEG for classification. Medications: Robaxin , Celebrex  Technical Summary: A multichannel digital EEG recording measured by the international 10-20 system with electrodes applied with paste and impedances below 5000 ohms performed in our  laboratory with EKG monitoring in an awake and asleep patient.  Hyperventilation was not performed. Photic stimulation was performed.  The digital EEG was referentially recorded, reformatted, and digitally filtered in a variety of bipolar and referential montages for optimal display.  Description: The patient is awake and asleep during the recording.  During maximal wakefulness, there is a symmetric, medium voltage 10 Hz posterior dominant rhythm that attenuates with eye opening.  The record is symmetric.  During drowsiness and sleep, there is an increase in theta slowing of the background.  Vertex waves and symmetric sleep spindles were seen. Photic stimulation did not elicit any abnormalities.  There were no epileptiform discharges or electrographic seizures seen.  EKG lead was unremarkable. Impression: This awake and asleep EEG is normal.  Clinical Correlation: A normal EEG does not exclude a clinical diagnosis of epilepsy.  If further clinical questions remain, prolonged EEG may be helpful.  Clinical correlation is advised. Darice Shivers, M.D.  MR ANGIO HEAD WO CONTRAST Result Date: 05/14/2023 CLINICAL DATA:  Initial evaluation for syncope, headaches. EXAM: MRA HEAD WITHOUT CONTRAST TECHNIQUE: Angiographic images of the Circle of Willis were acquired using MRA technique without intravenous contrast. COMPARISON:  Comparison made with prior brain MRI from 02/15/2023 FINDINGS: Anterior circulation: Both internal carotid arteries are widely patent through the siphons without stenosis or other abnormality. A1 segments patent bilaterally. Normal anterior communicating artery complex. Anterior cerebral arteries widely patent without stenosis. No M1 stenosis or occlusion. Normal MCA bifurcations. Distal MCA branches perfused and symmetric. No beading or irregularity. Posterior circulation: Both V4 segments patent without stenosis. Vertebral arteries are largely codominant. Neither PICA origin visualized. Basilar  widely patent without stenosis. Superior cerebral arteries patent bilaterally. Both PCAs primarily supplied via the basilar well perfused or distal aspects without stenosis. No beading or irregularity. Anatomic variants: None significant. Other: No intracranial aneurysm or other vascular abnormality. IMPRESSION: Normal intracranial MRA. Electronically Signed   By: Morene Hoard M.D.   On: 05/14/2023 20:37       Assessment & Plan Dental abscess Tooth Infection Since 2021, they have experienced a chronic tooth infection, contributing to hypotension and systemic symptoms, including a foul odor and oral swelling. Financial barriers have prevented access to dental care. We discussed the risks of prolonged antibiotic use, such as Clostridium difficile colitis, and emphasized that antibiotics offer a temporary solution, highlighting the necessity of dental intervention to prevent complications like endocarditis. We will prescribe Augmentin  and refer them to social work for assistance with affordable dental care. Additionally, we advise increasing fluid intake, preferably with Liquid IV, and recommend probiotics to restore gut flora. Material hardship due to limited financial resources  Generalized abdominal pain Abdominal Pain and Constipation They have experienced intermittent abdominal pain and constipation since childbirth, alternating with diarrhea. A gastroenterology evaluation is necessary but has been deferred due to their current focus on exams. We will refer them to a gastroenterologist for further evaluation and address this issue in the next visit. Other specified hypotension Hypotension Their blood pressure measured 80/68 mmHg, accompanied by dizziness, headaches, and syncope, likely related  to the chronic tooth infection. We discussed the importance of monitoring blood pressure and increasing fluid intake to mitigate potential complications, including heart valve issues. We will monitor  their blood pressure, increase fluid intake, and order blood work, including blood culture, glucose levels, and thyroid function. We advised a hospital visit if syncope recurs or anuria occurs. Lumbar pain Lumbar Pain They report low back and side pain, possibly post-lumbar puncture, exacerbated by cold beverages. We discussed the potential for spontaneous resolution but recommended evaluation by a pain specialist if the pain persists. We will refer them to a pain specialist if necessary and reassured them about the potential for spontaneous resolution.     Orders Placed During this Encounter:   Orders Placed This Encounter  Procedures   AMB Referral VBCI Care Management    Referral Priority:   Routine    Referral Type:   Consultation    Referral Reason:   Care Coordination    Number of Visits Requested:   1   Meds ordered this encounter  Medications   amoxicillin -clavulanate (AUGMENTIN ) 875-125 MG tablet    Sig: Take 1 tablet by mouth 2 (two) times daily.    Dispense:  20 tablet    Refill:  0    General Health Maintenance We discussed the importance of probiotics to mitigate the risk of Clostridium difficile colitis due to prolonged antibiotic use and recommend probiotics to restore gut flora.  Follow-up We will schedule a one-month follow-up appointment and perform lab work, including blood culture and urinalysis.    This document was synthesized by artificial intelligence (Abridge) using HIPAA-compliant recording of the clinical interaction;   We discussed the use of AI scribe software for clinical note transcription with the patient, who gave verbal consent to proceed.    Additional Info: This encounter employed state-of-the-art, real-time, collaborative documentation. The patient actively reviewed and assisted in updating their electronic medical record on a shared screen, ensuring transparency and facilitating joint problem-solving for the problem list, overview, and plan. This  approach promotes accurate, informed care. The treatment plan was discussed and reviewed in detail, including medication safety, potential side effects, and all patient questions. We confirmed understanding and comfort with the plan. Follow-up instructions were established, including contacting the office for any concerns, returning if symptoms worsen, persist, or new symptoms develop, and precautions for potential emergency department visits.

## 2023-06-12 NOTE — Patient Instructions (Signed)
 VISIT SUMMARY:  During today's visit, we discussed your ongoing health concerns, including your persistent toothache, dizziness, headaches, fainting episodes, abdominal pain, and lower back pain. We reviewed your recent medical history, including your MRI and cardiology evaluations, and addressed your financial and employment concerns that have impacted your ability to seek dental care. We also discussed your concerns about antibiotics and the volume of blood required for lab work.  YOUR PLAN:  -TOOTH INFECTION: You have a chronic tooth infection that has been causing systemic symptoms like dizziness and headaches. We discussed the temporary nature of antibiotics and the need for dental care to prevent complications. We will prescribe Augmentin  and refer you to social work for help with affordable dental care. Additionally, we recommend increasing your fluid intake and taking probiotics to restore gut health.  -HYPOTENSION: Your low blood pressure, which is likely related to your tooth infection, has been causing dizziness and fainting. We discussed the importance of monitoring your blood pressure and increasing fluid intake. We will monitor your blood pressure, increase your fluid intake, and order blood work to check for any underlying issues. Please visit the hospital if you faint again or have trouble urinating.  -LUMBAR PAIN: Your lower back and side pain may be related to a lumbar puncture you had during childbirth. This pain might resolve on its own, but if it persists, we will refer you to a pain specialist for further evaluation.  -ABDOMINAL PAIN AND CONSTIPATION: You have been experiencing abdominal pain and changes in bowel habits since childbirth. We recommend seeing a gastroenterologist for further evaluation, but we will address this issue more thoroughly at your next visit.  -GENERAL HEALTH MAINTENANCE: We discussed the importance of taking probiotics to reduce the risk of gut issues  from prolonged antibiotic use. Probiotics can help restore the natural balance of bacteria in your gut.  INSTRUCTIONS:  We will schedule a follow-up appointment in one month and perform lab work, including blood culture and urinalysis. Please monitor your symptoms and visit the hospital if you experience fainting or trouble urinating.

## 2023-06-13 NOTE — Progress Notes (Signed)
 Urinalysis revealed urinary tract infection (UTI), which should be treated by antibiotic(s) I gave for dental infection(s).  Might explain the low blood pressure.  Recommend recheck urinalysis in 2-4 weeks.

## 2023-06-15 LAB — CULTURE, BLOOD (SINGLE): MICRO NUMBER:: 15943310

## 2023-06-15 LAB — URINALYSIS, COMPLETE
Bilirubin Urine: NEGATIVE
Glucose, UA: NEGATIVE
Hgb urine dipstick: NEGATIVE
Nitrite: NEGATIVE
RBC / HPF: NONE SEEN /HPF (ref 0–2)
Specific Gravity, Urine: 1.026 (ref 1.001–1.035)
pH: <=5 (ref 5.0–8.0)

## 2023-06-15 LAB — RPR: RPR Ser Ql: NONREACTIVE

## 2023-06-15 LAB — HIV ANTIBODY (ROUTINE TESTING W REFLEX): HIV 1&2 Ab, 4th Generation: NONREACTIVE

## 2023-06-15 LAB — TSH+FREE T4: TSH W/REFLEX TO FT4: 0.83 m[IU]/L

## 2023-06-16 ENCOUNTER — Other Ambulatory Visit: Payer: Self-pay

## 2023-06-28 ENCOUNTER — Ambulatory Visit (INDEPENDENT_AMBULATORY_CARE_PROVIDER_SITE_OTHER): Payer: Medicaid Other | Admitting: Neurology

## 2023-06-28 ENCOUNTER — Encounter: Payer: Self-pay | Admitting: Neurology

## 2023-06-28 VITALS — BP 100/59 | HR 65 | Ht 65.0 in | Wt 142.0 lb

## 2023-06-28 DIAGNOSIS — R55 Syncope and collapse: Secondary | ICD-10-CM

## 2023-06-28 DIAGNOSIS — G932 Benign intracranial hypertension: Secondary | ICD-10-CM

## 2023-06-28 MED ORDER — ACETAZOLAMIDE 250 MG PO TABS: ORAL_TABLET | ORAL | Status: DC

## 2023-06-28 NOTE — Progress Notes (Signed)
NEUROLOGY FOLLOW UP OFFICE NOTE  Jacqueline Miranda [id number] Jun 23, 2000  HISTORY OF PRESENT ILLNESS: I had the pleasure of seeing Jacqueline Miranda in follow-up in the neurology clinic on 06/28/2023. She is alone in the office today. An Arabic medical interpreter Ralene Ok, helps with the visit. 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 Records and images were personally reviewed where available. Since her last visit, she was seen by ophthalmologist Dr. [name] in 04/2023 with note of Papilledema OU, with somewhat blurry nasal, superior, and inferior disc margins. She had a lumbar puncture in 04/2023 with an elevated opening pressure of 30 cmH2O. She was started on Diamox 250mg  BID. She also had an EEG in 03/2023 which was normal. She denies any syncopal episodes since July 2024. She has shakiness and numbness in both hands. She states her left eye cannot see good, it is blurred and there is redness in the inner canthus. It feels like there is oil in both eyes, mostly in the left. She reports headaches are better,she was previously having them daily but now they are around once a week. Sometimes she has a severe one and vision has a dark hue during them. She feels reducing sugar intake has also improved symptoms. She notes back pain since the lumbar puncture, however points to her flanks. Pain starts in the midline then radiates to the flank regions. She has to lay on her stomach, she has to stop what she is doing and wait 5 minutes. She does not take any medication. She has been urinating frequently with the Diamox but denies any dysuria or foul-smelling urine however it appears urinalysis done 06/10/23 is abnormal and repeat UA has been recommended.    History on Initial Assessment 03/28/2023: This is a pleasant 23 year old right-handed woman with a  history of anemia, presenting for evaluation of syncope, headaches, dizziness, MRI brain showing partially empty sella, narrowed appearance of distal transverse dural venous sinuses bilaterally, and mild cerebellar tonsillar ectopia. She speaks some [place], the interpreter translates in Arabic as well.   She reports that she has had only one full episode of loss of consciousness that occurred in July. She was having a headache and recalls going up the stairs. She felt dizzy then woke up on the floor. Her mother found her, no convulsive activity reported. No tongue bite or incontinence. She woke up with the headache and dizziness. She did not seek care that time. She then reports another episode of near syncope in August, she again had a headache and dizziness, she sat down and repeatedly says she did not pass out this time. It took more than an hour to feel better. No further similar instances since then, however she continues to report headache and dizziness. These started after she delivered by baby girl 2.5 years ago. She states she has been staying away from sugar recently and she feels better, with headaches occurring around 2 days a week. Sometimes they occur once a week, other times they last all week. There is no nausea/vomiting but she spits a lot. She is sensitive to lights with them. Sometimes she cannot see clearly, L>R right eye. She takes Celecoxib which helps. When she has a headache, she has dizziness where she feels like she cannot control her body. She demonstrates slight hand tremors that occur with them. No spinning sensation. She has  constant numbness in both hands and feet, sometimes the numbness moves up her right arm. She has neck and back pain. No bowel/bladder dysfunction.  She started having left eye issues in 2021 and saw a doctor out of state who told her she had a virus and prescribed medication which made her feel better. She then moved to [place] 2 weeks later and since then eye pain  has recurred. Sometimes there is itching. She sometimes notices ?tinnitus on the right ear when lying on her right side, sometimes it is pulsatile or a low-pitched sound. No dysarthria/dysphagia, bowel/bladder dysfunction. Her mother has headaches. No family history of syncope. She denies any recent head injuries. She lives with 8 people in their home. She is unemployed, studying for her GED. She is driving.   I personally reviewed brain MRI with and without contrast done 02/22/23 which did not show any acute changes. 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  MRI lumbar spine without contrast normal.   PAST MEDICAL HISTORY: Past Medical History:  Diagnosis Date   Anemia    Anemia in pregnancy 06/28/2020   Rx iron     Toothache 04/21/2020    MEDICATIONS: Current Outpatient Medications on File Prior to Visit  Medication Sig Dispense Refill   acetaminophen (TYLENOL) 325 MG tablet Take 2 tablets (650 mg total) by mouth every 4 (four) hours as needed (for pain scale < 4). 100 tablet 0   acetaZOLAMIDE (DIAMOX) 250 MG tablet Take 1 tablet (250 mg total) by mouth 2 (two) times daily. 60 tablet 5   amoxicillin-clavulanate (AUGMENTIN) 875-125 MG tablet Take 1 tablet by mouth 2 (two) times daily. 20 tablet 0   celecoxib (CELEBREX) 200 MG capsule Take 1 capsule (200 mg total) by mouth 2 (two) times daily. 30 capsule 1   methocarbamol (ROBAXIN) 500 MG tablet Take 1 tablet (500 mg total) by mouth 4 (four) times daily. 60 tablet 2   No current facility-administered medications on file prior to visit.    ALLERGIES: No Known Allergies  FAMILY HISTORY: Family History  Problem Relation Age of Onset   Hepatitis B Father    Asthma Mother     SOCIAL HISTORY: Social History   Socioeconomic History   Marital status: Single    Spouse name: Not on file   Number of children: Not  on file   Years of education: Not on file   Highest education level: GED or equivalent  Occupational History   Not on file  Tobacco Use   Smoking status: Never   Smokeless tobacco: Never  Vaping Use   Vaping status: Never Used  Substance and Sexual Activity   Alcohol use: Never   Drug use: Never   Sexual activity: Not Currently  Other Topics Concern   Not on file  Social History Narrative   Father is Arob Scientist, physiological   Mother is [employer] Care[employer] information officer       Refugee Information   Number of Immediate Family Members: 8   Number of Immediate Family Members in [place]: 8   Date of Arrival: 02/12/20   Country of Birth: [place]   Other Country of Origin:: [place]   Location of Refugee [place]: Other   Other Location of Refugee Camp:: [place]   Duration in [place]: 11-15 years   Reason for Leaving Home Country: Religion   Primary Language: Other   Other Primary Language:: Dinka   Able  to Read in Primary Language: No   Able to Write in Primary Language: No   Education: Primary School   Marital Status: Single   Tuberculosis Screening Overseas: Negative   Health Department Labs Completed: Yes   Do You Feel Jumpy or Nervous?: No   Are You Very Watchful or 'Super Alert'?: No   Social Drivers of Health   Financial Resource Strain: High Risk (02/17/2023)   Overall Financial Resource Strain (CARDIA)    Difficulty of Paying Living Expenses: Hard  Food Insecurity: No Food Insecurity (02/17/2023)   Hunger Vital Sign    Worried About Running Out of Food in the Last Year: Never true    Ran Out of Food in the Last Year: Never true  Transportation Needs: Unmet Transportation Needs (02/17/2023)   PRAPARE - Transportation    Lack of Transportation (Medical): No    Lack of Transportation (Non-Medical): Yes  Physical Activity: Insufficiently Active (02/17/2023)   Exercise Vital Sign    Days of Exercise per Week: 3 days    Minutes of Exercise per Session: 30 min  Stress: No Stress Concern Present (02/17/2023)    [employer] of Occupational Health - Occupational Stress Questionnaire    Feeling of Stress : Not at all  Social Connections: Socially Integrated (02/17/2023)   Social Connection and Isolation Panel [NHANES]    Frequency of Communication with Friends and Family: More than three times a week    Frequency of Social Gatherings with Friends and Family: Twice a week    Attends Religious Services: More than 4 times per year    Active Member of [employer] or Organizations: Yes    Attends Engineer, structural: More than 4 times per year    Marital Status: Living with partner  Intimate Partner Violence: Not on file     PHYSICAL EXAM: Vitals:   06/28/23 1516  BP: (!) 100/59  Pulse: 65  SpO2: 100%   General: No acute distress Head:  Normocephalic/atraumatic Skin/Extremities: No rash, no edema Neurological Exam: alert and awake. No aphasia or dysarthria. Fund of knowledge is appropriate. Attention and concentration are normal.   Cranial nerves: Pupils equal, round. Extraocular movements intact with no nystagmus. Visual fields full.  No facial asymmetry.  Motor: Bulk and tone normal, muscle strength 5/5 throughout with no pronator drift.   Finger to nose testing intact.  Gait narrow-based and steady, able to tandem walk adequately.  Romberg negative.   IMPRESSION: This is a pleasant 23 yo RH woman with a history of anemia, with idiopathic intracranial hypertension (IIH) and syncope. Ophthalmology confirmed bilateral mild papilledema. 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 Lumbar puncture showed elevated opening pressure of 30 cmH2O. EEG normal, no syncopal episodes since 11/2022. She is on Diamox and continues to report vision changes, more on the left eye. There is some improvement in headaches. Increase Diamox 250mg : take 2 tabs in AM, 1 tab in PM. Follow-up with Dr. [name] for  repeat eye exam on medication. Have CMP done next week. She reports back/flank pain since LP, however UA done on 06/10/23 was abnormal, advised to have repeat UA done to assess for UTI causing these symptoms. Follow-up in 3-4 months, call for any changes.    Thank you for allowing me to participate in her care.  Please do not hesitate to call for any questions or concerns.    Patrcia Dolly, M.D.   CC: Dr.  Morisson

## 2023-06-28 NOTE — Patient Instructions (Signed)
Good to see you.  Increase Acetazolamide (Diamox) 250mg : take 2 tablets in AM, 1 tablet in PM  2. Schedule earlier visit with eye doctor Dr. [name]  3. Have bloodwork next week for CMP  4. Do repeat urinalysis as scheduled  5. Follow-up in 3-4 months, call for any changes

## 2023-07-05 ENCOUNTER — Encounter: Payer: Medicaid Other | Admitting: Physical Medicine and Rehabilitation

## 2023-07-05 ENCOUNTER — Other Ambulatory Visit: Payer: Self-pay

## 2023-07-05 NOTE — Patient Instructions (Signed)
 Visit Information  Ms. Jacqueline Miranda was given information about Medicaid Managed Care team care coordination services as a part of their [facility] Community Plan Medicaid benefit. Lemuel Loyd verbally consented to engagement with the [facility] Managed Care team.   If you are experiencing a medical emergency, please call 911 or report to your local emergency department or urgent care.   If you have a non-emergency medical problem during routine business hours, please contact your provider's office and ask to speak with a nurse.   For questions related to your [facility], please call: [phone] or visit the homepage here: [email or web address]  If you would like to schedule transportation through your [facility], please call the following number at least 2 days in advance of your appointment: [phone]   Rides for urgent appointments can also be made after hours by calling Member Services.  Call the Behavioral Health Crisis Line at [phone], at any time, 24 hours a day, 7 days a week. If you are in danger or need immediate medical attention call 911.  If you would like help to quit smoking, call 1-800-QUIT-NOW (606-612-8498) OR Espaol: 1-855-Djelo-Ya (8-144-664-6430) o para ms informacin haga clic aqu or Text READY to 799-599 to register via text  Ms. Jacqueline Miranda - following are the goals we discussed in your visit today:   Goals Addressed   None      Social Worker will follow up in 30 days.   Thersia Hoar, HEDWIG, MHA [facility]  Managed Medicaid Social Worker [phone]   Following is a copy of your plan of care:  There are no care plans that you recently modified to display for this patient.

## 2023-07-05 NOTE — Patient Outreach (Signed)
  Medicaid Managed Care Social Work Note  07/05/2023 Name:  Jacqueline Miranda MRN:  [id number] DOB:  [birth year]  Jacqueline Miranda is an 23 y.o. year old female who is a primary patient of Jesus Bernardino MATSU, MD.  The [facility] Managed Care Coordination team was consulted for assistance with:   dental resources  Ms. Brancato was given information about Medicaid Managed Care Coordination team services today. Jacqueline Miranda Patient agreed to services and verbal consent obtained.  Engaged with patient  for by telephone forinitial visit in response to referral for case management and/or care coordination services.   Patient is participating in a Managed Medicaid Plan:  Yes  Assessments/Interventions:  Review of past medical history, allergies, medications, health status, including review of consultants reports, laboratory and other test data, was performed as part of comprehensive evaluation and provision of chronic care management services.  SDOH: (Social Drivers of Health) assessments and interventions performed: SDOH Interventions    Flowsheet Row Office Visit from [facility]/18/2023 in [facility] [place]  SDOH Interventions   Depression Interventions/Treatment  PHQ2-9 Score <4 Follow-up Not Indicated      BSW completed a telephone outreach with patient, she states she needs dental resources Patient states she does not have any income and receives foodstamps. She is not responsible for rent and utilities. BSW and patient agreed for dental resources to be mailed to address on file. No other resources are needed at this time. Advanced Directives Status:  Not addressed in this encounter.  Care Plan                 No Known Allergies  Medications Reviewed Today   Medications were not reviewed in this encounter     Patient Active Problem List   Diagnosis Date Noted   IIH (idiopathic intracranial hypertension) 02/22/2023   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   Scoliosis 02/20/2023   Chronic  neck pain 02/19/2023   Iron deficiency anemia 02/06/2023   Impaired fasting glucose 02/06/2023   Syncope 02/03/2023   Migraine 02/03/2023   Stomach pain 03/29/2022   Back pain 05/19/2021   Acute reaction to situational stress 09/04/2020   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   Leg pain, bilateral 04/21/2020   Blurred vision 04/21/2020    Conditions to be addressed/monitored per PCP order:   dental resources  There are no care plans that you recently modified to display for this patient.   Follow up:  Patient agrees to Care Plan and Follow-up.  Plan: The Managed Medicaid care management team will reach out to the patient again over the next 30 days.  Date/time of next scheduled Social Work care management/care coordination outreach:  08/02/23  Thersia Hoar, HEDWIG, [facility]  Managed [facility] Social Worker [phone]

## 2023-07-15 ENCOUNTER — Ambulatory Visit (INDEPENDENT_AMBULATORY_CARE_PROVIDER_SITE_OTHER): Payer: Medicaid Other | Admitting: Internal Medicine

## 2023-07-15 ENCOUNTER — Encounter: Payer: Self-pay | Admitting: Internal Medicine

## 2023-07-15 VITALS — BP 109/70 | HR 66 | Temp 97.9°F | Ht 65.0 in | Wt 145.6 lb

## 2023-07-15 DIAGNOSIS — Z603 Acculturation difficulty: Secondary | ICD-10-CM

## 2023-07-15 DIAGNOSIS — Z87440 Personal history of urinary (tract) infections: Secondary | ICD-10-CM | POA: Diagnosis not present

## 2023-07-15 DIAGNOSIS — R42 Dizziness and giddiness: Secondary | ICD-10-CM | POA: Diagnosis not present

## 2023-07-15 DIAGNOSIS — K047 Periapical abscess without sinus: Secondary | ICD-10-CM

## 2023-07-15 DIAGNOSIS — Z758 Other problems related to medical facilities and other health care: Secondary | ICD-10-CM

## 2023-07-15 DIAGNOSIS — G932 Benign intracranial hypertension: Secondary | ICD-10-CM

## 2023-07-15 DIAGNOSIS — I959 Hypotension, unspecified: Secondary | ICD-10-CM

## 2023-07-15 DIAGNOSIS — D509 Iron deficiency anemia, unspecified: Secondary | ICD-10-CM

## 2023-07-15 MED ORDER — CEPHALEXIN 500 MG PO CAPS: 500.0000 mg | ORAL_CAPSULE | Freq: Three times a day (TID) | ORAL | 0 refills | Status: DC

## 2023-07-15 NOTE — Patient Instructions (Addendum)
 VISIT SUMMARY:  Today, we discussed your ongoing dizziness and low blood pressure, your management of idiopathic intracranial hypertension, mild anemia, dental issues, and recurrent urinary tract infections. We reviewed your current treatments and provided recommendations for monitoring and managing your conditions.  YOUR PLAN:  -HYPOTENSION: Hypotension means low blood pressure, which can cause dizziness. Your blood pressure was low during your last visit but has improved today. To confirm if low blood pressure is causing your dizziness, monitor your blood pressure during dizzy spells using an arm cuff blood pressure monitor (Omron or Braun brands recommended). Avoid wrist monitors and send your readings via MyChart.  -IDIOPATHIC INTRACRANIAL HYPERTENSION: Idiopathic intracranial hypertension is a condition where there is increased pressure around the brain without a known cause. You are currently managing this with acetazolamide (Diamox) and have an upcoming ophthalmology appointment to check your eye pressure. Continue taking acetazolamide and attend your eye appointment on February 20th. We provided handouts in Arabic for acetazolamide and Keflex, and [place] neurology notes for your review.  -ANEMIA: Anemia is a condition where you have fewer red blood cells than normal, leading to fatigue and weakness. Your hemoglobin levels are slightly low but have improved. Continue eating iron-rich foods and consider taking a multivitamin if dietary changes are not enough.  -DENTAL INFECTION: You have a history of dental infections and are awaiting a referral to a dental office. You experienced abdominal discomfort with Augmentin in the past, so we recommend using Keflex if needed, as it has fewer gastrointestinal side effects. Do not take antibiotics unless your dental pain worsens. We prescribed Keflex to have on hand in case of infection recurrence.  -URINARY TRACT INFECTION (UTI): A UTI is an infection in  any part of your urinary system. You have had recurrent UTIs and we discussed potential causes and prevention methods. Proper hygiene practices can help prevent UTIs. We prescribed Keflex to have on hand in case of UTI recurrence.  INSTRUCTIONS:  Please drop off a urine sample and return for a follow-up visit in 1-3 months.   We provided this note from neurology in english per your request to help you better understand your healthcare I had the pleasure of seeing Jacqueline Miranda in follow-up in the neurology clinic on 06/28/2023. She is alone in the office today. An Arabic medical interpreter Ralene Ok, helps with the visit. 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 Records and images were personally reviewed where available. Since her last visit, she was seen by ophthalmologist Dr. [name] in 04/2023 with note of Papilledema OU, with somewhat blurry nasal, superior, and inferior disc margins. She had a lumbar puncture in 04/2023 with an elevated opening pressure of 30 cmH2O. She was started on Diamox 250mg  BID. She also had an EEG in 03/2023 which was normal. She denies any syncopal episodes since July 2024. She has shakiness and numbness in both hands. She states her left eye cannot see good, it is blurred and there is redness in the inner canthus. It feels like there is oil in both eyes, mostly in the left. She reports headaches are better,she was previously having them daily but now they are around once a week. Sometimes she has a severe one and vision has a dark hue during them. She feels reducing sugar intake has also improved symptoms. She notes back pain since the lumbar puncture, however points to  her flanks. Pain starts in the midline then radiates to the flank regions. She has to lay on her stomach, she has to stop what she is doing and  wait 5 minutes. She does not take any medication. She has been urinating frequently with the Diamox but denies any dysuria or foul-smelling urine however it appears urinalysis done 06/10/23 is abnormal and repeat UA has been recommended.      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 She speaks some [place], the interpreter translates in Arabic as well.    She reports that she has had only one full episode of loss of consciousness that occurred in July. She was having a headache and recalls going up the stairs. She felt dizzy then woke up on the floor. Her mother found her, no convulsive activity reported. No tongue bite or incontinence. She woke up with the headache and dizziness. She did not seek care that time. She then reports another episode of near syncope in August, she again had a headache and dizziness, she sat down and repeatedly says she did not pass out this time. It took more than an hour to feel better. No further similar instances since then, however she continues to report headache and dizziness. These started after she delivered by baby girl 2.5 years ago. She states she has been staying away from sugar recently and she feels better, with headaches occurring around 2 days a week. Sometimes they occur once a week, other times they last all week. There is no nausea/vomiting but she spits a lot. She is sensitive to lights with them. Sometimes she cannot see clearly, L>R right eye. She takes Celecoxib which helps. When she has a headache, she has dizziness where she feels like she cannot control her body. She demonstrates slight hand tremors that occur with them. No spinning sensation. She has constant numbness in  both hands and feet, sometimes the numbness moves up her right arm. She has neck and back pain. No bowel/bladder dysfunction.   She started having left eye issues in 2021 and [place] a doctor out of state who told her she had a virus and prescribed medication which made her feel better. She then moved to [place] 2 weeks later and since then eye pain has recurred. Sometimes there is itching. She sometimes notices ?tinnitus on the right ear when lying on her right side, sometimes it is pulsatile or a low-pitched sound. No dysarthria/dysphagia, bowel/bladder dysfunction. Her mother has headaches. No family history of syncope. She denies any recent head injuries. She lives with 8 people in their home. She is unemployed, studying for her GED. She is driving.    I personally reviewed brain MRI with and without contrast done 02/22/23 which did not show any acute changes. 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   MRI lumbar spine without contrast normal.  This is a pleasant 23 yo RH woman with a history of anemia, with idiopathic intracranial hypertension (IIH) and syncope. Ophthalmology confirmed bilateral mild papilledema. 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 Lumbar puncture showed elevated opening pressure of 30 cmH2O. EEG normal,  no syncopal episodes since 11/2022. She is on Diamox and continues to report vision changes, more on the left eye. There is some improvement in headaches. Increase Diamox 250mg : take 2 tabs in AM, 1 tab in PM. Follow-up with Dr. [name] for repeat eye exam on medication. Have CMP done next week. She reports back/flank pain since LP, however UA done on 06/10/23 was abnormal, advised to have repeat UA done to assess for UTI causing these symptoms. Follow-up in 3-4  months, call for any changes.

## 2023-07-15 NOTE — Progress Notes (Signed)
 ==============================  [facility]: [phone]   -- Medical Office Visit --  Patient: Jacqueline Miranda      Age: 23 y.o.       Sex:  female  Date:   07/15/2023 Today's Healthcare Provider: Lula Olszewski, MD  ==============================   CHIEF COMPLAINT: 1 month follow-up (Repeat urinalysis.)  SUBJECTIVE: Background This is a 23 y.o. female who has Leg pain, bilateral; Blurred vision; Positive RPR test; Acute reaction to situational stress; Back pain; Stomach pain; Syncope; Migraine; Iron deficiency anemia; Impaired fasting glucose; Chronic neck pain; Scoliosis; IIH (idiopathic intracranial hypertension); Empty sella turcica (HCC); and Dizziness on their problem list.  History of Present Illness Jacqueline Miranda is a 23 year old female with idiopathic intracranial hypertension who presents with dizziness and low blood pressure.  She experiences ongoing dizziness, which occurs intermittently rather than daily. The dizziness was more frequent in the past but has improved somewhat. She has not been checking her blood pressure during these episodes but recalls low blood pressure during a previous visit, prompting today's follow-up.  She has a history of idiopathic intracranial hypertension, diagnosed prior to her last delivery. She underwent a lumbar puncture in November to reduce cerebrospinal fluid pressure and is currently taking acetazolamide (Diamox) to manage this condition. She has an upcoming appointment with an eye specialist to evaluate her eye pressure, which she believes is related to her intracranial hypertension.  She mentions recurrent urinary tract infections and is unsure of the cause, as she has not been sexually active due to her husband's absence. She is aware that UTIs can be caused by bacteria entering the urinary tract and is considering hygiene practices as a potential factor.  She has a history of dental issues and has been  referred to a dental facility to schedule an appointment. She has experienced abdominal discomfort and unusual sounds when taking Augmentin in the past for dental infections and is considering using Keflex as an alternative due to fewer gastrointestinal side effects.  She has mild anemia, with a hemoglobin level of 11.7 as of January 10th, slightly improved from 11.6 in September. She has been consuming iron-rich foods but notes that her anemia has persisted for a long time.  Reviewed chart records that patient  has a past medical history of Anemia, Anemia in pregnancy (06/28/2020), and Toothache (04/21/2020). Discussed Past Medical History - Low blood pressure - Anemia - Idiopathic intracranial hypertension   Results LABS Hb: 11.7 g/dL (36/64/4034) Hb: 74.2 g/dL (59/5638)  RADIOLOGY MRI brain: Normal MRA brain: Normal  Current Outpatient Medications on File Prior to Visit  Medication Sig   acetaZOLAMIDE (DIAMOX) 250 MG tablet Take 2 tablets in AM, 1 tablet in PM   acetaminophen (TYLENOL) 325 MG tablet Take 2 tablets (650 mg total) by mouth every 4 (four) hours as needed (for pain scale < 4). (Patient not taking: Reported on 07/15/2023)   amoxicillin-clavulanate (AUGMENTIN) 875-125 MG tablet Take 1 tablet by mouth 2 (two) times daily. (Patient not taking: Reported on 07/15/2023)   celecoxib (CELEBREX) 200 MG capsule Take 1 capsule (200 mg total) by mouth 2 (two) times daily. (Patient not taking: Reported on 07/15/2023)   methocarbamol (ROBAXIN) 500 MG tablet Take 1 tablet (500 mg total) by mouth 4 (four) times daily. (Patient not taking: Reported on 07/15/2023)   No current facility-administered medications on file prior to visit.  There are no discontinued medications.    Objective   Physical Exam  07/15/2023    9:23 AM 06/28/2023    3:16 PM 06/10/2023    9:49 AM  Vitals with BMI  Height 5\' 5"  5\' 5"    Weight 145 lbs 10 oz 142 lbs   BMI 24.23 23.63   Systolic 109 100 86   Diastolic 70 59 68  Pulse 66 65    Wt Readings from Last 10 Encounters:  07/15/23 145 lb 9.6 oz (66 kg)  06/28/23 142 lb (64.4 kg)  06/10/23 145 lb 9.6 oz (66 kg)  05/03/23 152 lb (68.9 kg)  04/05/23 154 lb (69.9 kg)  03/28/23 154 lb 12.8 oz (70.2 kg)  03/04/23 154 lb 6.4 oz (70 kg)  02/18/23 153 lb 6 oz (69.6 kg)  02/03/23 160 lb 6.4 oz (72.8 kg)  03/29/22 160 lb 2 oz (72.6 kg)   Vital signs reviewed.  Nursing notes reviewed. Weight trend reviewed. Abnormalities and Problem-Specific physical exam findings:  no dizziness with rapid sit to stand  General Appearance:  No acute distress appreciable.   Well-groomed, healthy-appearing female.  Well proportioned with no abnormal fat distribution.  Good muscle tone. Pulmonary:  Normal work of breathing at rest, no respiratory distress apparent. SpO2: 99 %  Musculoskeletal: All extremities are intact.  Neurological:  Awake, alert, oriented, and engaged.  No obvious focal neurological deficits or cognitive impairments.  Sensorium seems unclouded.   Speech is clear and coherent with logical content. Psychiatric:  Appropriate mood, pleasant and cooperative demeanor, thoughtful and engaged during the exam    Results for orders placed or performed in visit on 07/15/23  Urinalysis, Complete  Result Value Ref Range   Color, Urine YELLOW YELLOW   APPearance CLEAR CLEAR   Specific Gravity, Urine 1.023 1.001 - 1.035   pH 6.0 5.0 - 8.0   Glucose, UA NEGATIVE NEGATIVE   Bilirubin Urine NEGATIVE NEGATIVE   Ketones, ur NEGATIVE NEGATIVE   Hgb urine dipstick NEGATIVE NEGATIVE   Protein, ur NEGATIVE NEGATIVE   Nitrite NEGATIVE NEGATIVE   Leukocytes,Ua NEGATIVE NEGATIVE   WBC, UA 0-5 0 - 5 /HPF   RBC / HPF NONE SEEN 0 - 2 /HPF   Squamous Epithelial / HPF 6-10 (A) < OR = 5 /HPF   Bacteria, UA NONE SEEN NONE SEEN /HPF   Hyaline Cast NONE SEEN NONE SEEN /LPF   Note     Office Visit on 07/15/2023  Component Date Value   Color, Urine 07/15/2023  YELLOW    APPearance 07/15/2023 CLEAR    Specific Gravity, Urine 07/15/2023 1.023    pH 07/15/2023 6.0    Glucose, UA 07/15/2023 NEGATIVE    Bilirubin Urine 07/15/2023 NEGATIVE    Ketones, ur 07/15/2023 NEGATIVE    Hgb urine dipstick 07/15/2023 NEGATIVE    Protein, ur 07/15/2023 NEGATIVE    Nitrite 07/15/2023 NEGATIVE    Leukocytes,Ua 07/15/2023 NEGATIVE    WBC, UA 07/15/2023 0-5    RBC / HPF 07/15/2023 NONE SEEN    Squamous Epithelial / HPF 07/15/2023 6-10 (A)    Bacteria, UA 07/15/2023 NONE SEEN    Hyaline Cast 07/15/2023 NONE SEEN    Note 07/15/2023    Office Visit on 06/10/2023  Component Date Value   Sodium 06/10/2023 138    Potassium 06/10/2023 3.4 (L)    Chloride 06/10/2023 108    CO2 06/10/2023 21    Glucose, Bld 06/10/2023 97    BUN 06/10/2023 17    Creatinine, Ser 06/10/2023 0.75    Total Bilirubin 06/10/2023 0.7  Alkaline Phosphatase 06/10/2023 104    AST 06/10/2023 15    ALT 06/10/2023 11    Total Protein 06/10/2023 8.5 (H)    Albumin 06/10/2023 4.7    GFR 06/10/2023 111.11    Calcium 06/10/2023 9.6    WBC 06/10/2023 8.8    RBC 06/10/2023 4.31    Hemoglobin 06/10/2023 11.7 (L)    HCT 06/10/2023 35.7 (L)    MCV 06/10/2023 83.0    MCHC 06/10/2023 32.8    RDW 06/10/2023 13.9    Platelets 06/10/2023 310.0    Neutrophils Relative % 06/10/2023 66.1    Lymphocytes Relative 06/10/2023 23.0    Monocytes Relative 06/10/2023 7.2    Eosinophils Relative 06/10/2023 3.0    Basophils Relative 06/10/2023 0.7    Neutro Abs 06/10/2023 5.8    Lymphs Abs 06/10/2023 2.0    Monocytes Absolute 06/10/2023 0.6    Eosinophils Absolute 06/10/2023 0.3    Basophils Absolute 06/10/2023 0.1    TSH W/REFLEX TO FT4 06/10/2023 0.83    RPR Ser Ql 06/10/2023 NON-REACTIVE    HIV 1&2 Ab, 4th Generati* 06/10/2023 NON-REACTIVE    Hgb A1c MFr Bld 06/10/2023 5.8    Sed Rate 06/10/2023 48 (H)    CRP 06/10/2023 <1.0    MICRO NUMBER: 06/10/2023 [id number]    SPECIMEN QUALITY: 06/10/2023  Suboptimal    Source 06/10/2023 OTHER (SPECIFY)    STATUS: 06/10/2023 FINAL    Result: 06/10/2023 No growth after 5 days Inspection of blood culture bottles indicates that an inadequate volume of blood may have been collected for the detection of sepsis.    COMMENT: 06/10/2023 Aerobic and anaerobic bottle received.    Color, Urine 06/10/2023 ORANGE (A)    APPearance 06/10/2023 TURBID (A)    Specific Gravity, Urine 06/10/2023 1.026    pH 06/10/2023 < OR = 5.0    Glucose, UA 06/10/2023 NEGATIVE    Bilirubin Urine 06/10/2023 NEGATIVE    Ketones, ur 06/10/2023 TRACE (A)    Hgb urine dipstick 06/10/2023 NEGATIVE    Protein, ur 06/10/2023 TRACE (A)    Nitrite 06/10/2023 NEGATIVE    Leukocytes,Ua 06/10/2023 TRACE (A)    WBC, UA 06/10/2023 6-10 (A)    RBC / HPF 06/10/2023 NONE SEEN    Squamous Epithelial / HPF 06/10/2023 20-40 (A)    Bacteria, UA 06/10/2023 MANY (A)    Hyaline Cast 06/10/2023 0-5 (A)    Note 06/10/2023    Appointment on 05/10/2023  Component Date Value   Area-P 1/2 05/10/2023 3.31    S' Lateral 05/10/2023 3.40    Est EF 05/10/2023 60 - 65%   Hospital Outpatient Visit on 04/15/2023  Component Date Value   CYTOLOGY - NON GYN 04/15/2023                     Value:CYTOLOGY - NON PAP CASE: [id number] PATIENT: [name] Non-Gynecological Cytology Report     Clinical History: Blurred vision, Empty sella turcica, Nonintractable episodic headache, unspecified headache type Specimen Submitted:  A. CEREBRAL SPINAL FLUID, SPINAL TAP:   FINAL MICROSCOPIC DIAGNOSIS: - No malignant cells identified  SPECIMEN ADEQUACY: Satisfactory for evaluation  GROSS: Received is/are: 7cc's of clear, colorless fluid. (MW:mw) Smears: 0 Concentration Method (Thin Prep): 1 Cell Block: 0 Additional Studies: N/A     Final Diagnosis performed by Clifton James, MD.   Electronically signed 04/18/2023 Technical component performed at [employer]. [facility], [address] [place], [place] [address].  Professional component performed at [employer]  [facility], [address], [place], [place] [address].  Immunohistochemistry Technical component (if applicable) was performed at [facility]. [address], [place], [place] [address].   IMMUNOHISTOCHEMISTRY DISCLAIMER (if applicable): Some of these immunohistochemical stains may have been developed and the performance characteristics determine by [facility]. Some may not have been cleared or approved by the U.S. Food and Drug Administration. The FDA has determined that such clearance or approval is not necessary. This test is used for clinical purposes. It should not be regarded as investigational or for research. This laboratory is certified under the Clinical Laboratory Improvement Amendments of 1988 (CLIA-88) as qualified to perform high complexity clinical laboratory testing.  The controls stained appropriately.   IHC stains are performed on formalin fixed, paraffin embedded tissue using a 3,3"diaminobenzidine (DAB) chromogen and Leica Bond Autostainer System. The staining intensity of the nucleus is score manually and is reported as the percentage of tumor cell nuclei demonstrating specific nuclear                          staining. The specimens are fixed in 10% Neutral Formalin for at least 6 hours and up to 72hrs. These tests are validated on decalcified tissue. Results should be interpreted with caution given the possibility of false negative results on decalcified specimens. Antibody Clones are as follows ER-clone 60F, PR-clone 16, Ki67- clone MM1. Some of these immunohistochemical stains may have been developed and the performance characteristics determined by [facility].    Total Protein, CSF 04/15/2023 19    Color, CSF 04/15/2023 COLORLESS    Appearance, CSF 04/15/2023 CLEAR    RBC Count, CSF 04/15/2023 0     TOTAL NUCLEATED CELL 04/15/2023 0    Segmented Neutrophils-CSF 04/15/2023 CANCELED    Comment: 04/15/2023     Glucose, CSF 04/15/2023 62   Lab on 03/28/2023  Component Date Value   Vitamin B-12 03/28/2023 789    Anti Nuclear Antibody (A* 03/28/2023 NEGATIVE    CRP 03/28/2023 <1.0    Sed Rate 03/28/2023 33 (H)   Office Visit on 02/18/2023  Component Date Value   Hemoglobin A1C 02/18/2023 5.5   Office Visit on 02/03/2023  Component Date Value   Cholesterol 02/03/2023 114    Triglycerides 02/03/2023 65.0    HDL 02/03/2023 53.40    VLDL 02/03/2023 13.0    LDL Cholesterol 02/03/2023 47    Total CHOL/HDL Ratio 02/03/2023 2    NonHDL 02/03/2023 60.33    TSH 02/03/2023 1.23    Sodium 02/03/2023 137    Potassium 02/03/2023 3.5    Chloride 02/03/2023 105    CO2 02/03/2023 27    Glucose, Bld 02/03/2023 104 (H)    BUN 02/03/2023 15    Creatinine, Ser 02/03/2023 0.56    Total Bilirubin 02/03/2023 0.6    Alkaline Phosphatase 02/03/2023 87    AST 02/03/2023 18    ALT 02/03/2023 11    Total Protein 02/03/2023 7.8    Albumin 02/03/2023 4.1    GFR 02/03/2023 127.68    Calcium 02/03/2023 9.3    WBC 02/03/2023 8.0    RBC 02/03/2023 4.24    Hemoglobin 02/03/2023 11.6 (L)    HCT 02/03/2023 34.7 (L)    MCV 02/03/2023 81.9    MCHC 02/03/2023 33.5    RDW  02/03/2023 13.3    Platelets 02/03/2023 298.0    Neutrophils Relative % 02/03/2023 50.1    Lymphocytes Relative 02/03/2023 34.8    Monocytes Relative 02/03/2023 9.5    Eosinophils Relative 02/03/2023 5.0    Basophils Relative 02/03/2023 0.6    Neutro Abs 02/03/2023 4.0    Lymphs Abs 02/03/2023 2.8    Monocytes Absolute 02/03/2023 0.8    Eosinophils Absolute 02/03/2023 0.4    Basophils Absolute 02/03/2023 0.0    Troponin I 02/03/2023 <3    Alcohol Metabolites 02/03/2023 NEGATIVE    Amphetamines 02/03/2023 NEGATIVE    Benzodiazepines 02/03/2023 NEGATIVE    Buprenorphine, Urine 02/03/2023 NEGATIVE    Cocaine Metabolite 02/03/2023  NEGATIVE    6 Acetylmorphine 02/03/2023 NEGATIVE    Marijuana Metabolite 02/03/2023 NEGATIVE    MDMA 02/03/2023 NEGATIVE    Opiates 02/03/2023 NEGATIVE    Oxycodone 02/03/2023 NEGATIVE    Creatinine 02/03/2023 117.8    pH 02/03/2023 7.8    Oxidant 02/03/2023 NEGATIVE    Preg Test, Ur 02/03/2023 NEGATIVE    Color, Urine 02/03/2023 YELLOW    APPearance 02/03/2023 CLOUDY (A)    Specific Gravity, Urine 02/03/2023 1.023    pH 02/03/2023 7.5    Glucose, UA 02/03/2023 NEGATIVE    Bilirubin Urine 02/03/2023 NEGATIVE    Ketones, ur 02/03/2023 NEGATIVE    Hgb urine dipstick 02/03/2023 NEGATIVE    Protein, ur 02/03/2023 NEGATIVE    Nitrite 02/03/2023 NEGATIVE    Leukocytes,Ua 02/03/2023 NEGATIVE    Notes and Comments 02/03/2023    No image results found. MR ANGIO HEAD WO CONTRAST Result Date: 05/14/2023 CLINICAL DATA:  Initial evaluation for syncope, headaches. EXAM: MRA HEAD WITHOUT CONTRAST TECHNIQUE: Angiographic images of the Circle of Willis were acquired using MRA technique without intravenous contrast. COMPARISON:  Comparison made with prior brain MRI from 02/15/2023 FINDINGS: Anterior circulation: Both internal carotid arteries are widely patent through the siphons without stenosis or other abnormality. A1 segments patent bilaterally. Normal anterior communicating artery complex. Anterior cerebral arteries widely patent without stenosis. No M1 stenosis or occlusion. Normal MCA bifurcations. Distal MCA branches perfused and symmetric. No beading or irregularity. Posterior circulation: Both V4 segments patent without stenosis. Vertebral arteries are largely codominant. Neither PICA origin visualized. Basilar widely patent without stenosis. Superior cerebral arteries patent bilaterally. Both PCAs primarily supplied via the basilar well perfused or distal aspects without stenosis. No beading or irregularity. Anatomic variants: None significant. Other: No intracranial aneurysm or other vascular  abnormality. IMPRESSION: Normal intracranial MRA. Electronically Signed   By: Rise Mu M.D.   On: 05/14/2023 20:37   ECHOCARDIOGRAM COMPLETE Result Date: 05/10/2023    ECHOCARDIOGRAM REPORT   Patient Name:   GWENETH FREDLUND     Date of Exam: 05/10/2023 Medical Rec #:  [id number]     Height:       65.0 in Accession #:    [id number]    Weight:       152.0 lb Date of Birth:  Sep 14, 2000     BSA:          1.760 m Patient Age:    22 years      BP:           111/77 mmHg Patient Gender: F             HR:           59 bpm. Exam Location:  [address] Procedure: 2D Echo, Cardiac Doppler, Color Doppler, 3D Echo and Strain Analysis Indications:  Syncope R55  History:        Patient has no prior history of Echocardiogram examinations.  Sonographer:    Thurman Coyer RDCS Referring Phys: [phone] THOMAS A KELLY IMPRESSIONS  1. Left ventricular ejection fraction, by estimation, is 60 to 65%. The left ventricle has normal function. The left ventricle has no regional wall motion abnormalities. Left ventricular diastolic parameters were normal. The average left ventricular global longitudinal strain is -22.0 %. The global longitudinal strain is normal.  2. Right ventricular systolic function is normal. The right ventricular size is mildly enlarged. There is normal pulmonary artery systolic pressure.  3. The mitral valve is normal in structure. No evidence of mitral valve regurgitation. No evidence of mitral stenosis.  4. The aortic valve is normal in structure. Aortic valve regurgitation is not visualized. No aortic stenosis is present.  5. The inferior vena cava is normal in size with greater than 50% respiratory variability, suggesting right atrial pressure of 3 mmHg. Comparison(s): No prior Echocardiogram. FINDINGS  Left Ventricle: Left ventricular ejection fraction, by estimation, is 60 to 65%. The left ventricle has normal function. The left ventricle has no regional wall motion abnormalities. The average left  ventricular global longitudinal strain is -22.0 %. The global longitudinal strain is normal. The left ventricular internal cavity size was normal in size. There is no left ventricular hypertrophy. Left ventricular diastolic parameters were normal. Right Ventricle: The right ventricular size is mildly enlarged. No increase in right ventricular wall thickness. Right ventricular systolic function is normal. There is normal pulmonary artery systolic pressure. The tricuspid regurgitant velocity is 1.84  m/s, and with an assumed right atrial pressure of 3 mmHg, the estimated right ventricular systolic pressure is 16.5 mmHg. Left Atrium: Left atrial size was normal in size. Right Atrium: Right atrial size was normal in size. Pericardium: There is no evidence of pericardial effusion. Mitral Valve: The mitral valve is normal in structure. No evidence of mitral valve regurgitation. No evidence of mitral valve stenosis. Tricuspid Valve: The tricuspid valve is normal in structure. Tricuspid valve regurgitation is trivial. No evidence of tricuspid stenosis. Aortic Valve: The aortic valve is normal in structure. Aortic valve regurgitation is not visualized. No aortic stenosis is present. Pulmonic Valve: The pulmonic valve was normal in structure. Pulmonic valve regurgitation is mild. No evidence of pulmonic stenosis. Aorta: The aortic root is normal in size and structure. Venous: The inferior vena cava is normal in size with greater than 50% respiratory variability, suggesting right atrial pressure of 3 mmHg. IAS/Shunts: No atrial level shunt detected by color flow Doppler.  LEFT VENTRICLE PLAX 2D LVIDd:         4.80 cm   Diastology LVIDs:         3.40 cm   LV e' medial:    12.30 cm/s LV PW:         0.80 cm   LV E/e' medial:  7.9 LV IVS:        0.70 cm   LV e' lateral:   18.00 cm/s LVOT diam:     2.10 cm   LV E/e' lateral: 5.4 LV SV:         49 LV SV Index:   28        2D Longitudinal Strain LVOT Area:     3.46 cm  2D Strain GLS  Avg:     -22.0 %  3D Volume EF:                          3D EF:        60 %                          LV EDV:       140 ml                          LV ESV:       56 ml                          LV SV:        83 ml RIGHT VENTRICLE RV Basal diam:  4.70 cm RV Mid diam:    4.50 cm RV S prime:     14.70 cm/s TAPSE (M-mode): 2.8 cm LEFT ATRIUM             Index        RIGHT ATRIUM           Index LA diam:        3.20 cm 1.82 cm/m   RA Area:     15.30 cm LA Vol (A2C):   35.6 ml 20.22 ml/m  RA Volume:   39.50 ml  22.44 ml/m LA Vol (A4C):   34.6 ml 19.66 ml/m LA Biplane Vol: 36.7 ml 20.85 ml/m  AORTIC VALVE LVOT Vmax:   76.00 cm/s LVOT Vmean:  49.300 cm/s LVOT VTI:    0.141 m  AORTA Ao Root diam: 2.30 cm Ao Asc diam:  2.80 cm MITRAL VALVE               TRICUSPID VALVE MV Area (PHT): 3.31 cm    TR Peak grad:   13.5 mmHg MV Decel Time: 229 msec    TR Vmax:        184.00 cm/s MV E velocity: 97.50 cm/s MV A velocity: 42.40 cm/s  SHUNTS MV E/A ratio:  2.30        Systemic VTI:  0.14 m                            Systemic Diam: 2.10 cm Sunit Tolia Electronically signed by Tessa Lerner Signature Date/Time: 05/10/2023/6:37:09 PM    Final    LONG TERM MONITOR (3-14 DAYS) Result Date: 05/09/2023 Patch Wear Time:  13 days and 23 hours (2024-11-07T21:21:29-0500 to 2024-11-21T21:21:25-0500) Patient had a min HR of 47 bpm, max HR of 173 bpm, and avg HR of 78 bpm. Predominant underlying rhythm was Sinus Rhythm. 1 run of Supraventricular Tachycardia occurred lasting 5 beats with a max rate of 128 bpm (avg 125 bpm). Isolated SVEs were rare (<1.0%), SVE Couplets were rare (<1.0%), and no SVE Triplets were present. Isolated VEs were rare (<1.0%), and no VE Couplets or VE Triplets were present. The predominant rhythm was sinus rhythm with an average rate at 78 bpm.  The slowest heart rate was sinus bradycardia which occurred at 3:05 AM while sleeping.  The fastest heart rate was sinus tachycardia at 178 bpm.   There was 1 episode of 5 beats of SVT with an average rate at 125.  There were rare isolated PACs and atrial couplets, and very rare isolated PVCs.  There were no episodes  of atrial fibrillation or pauses.  MR ANGIO HEAD WO CONTRAST Result Date: 05/14/2023 CLINICAL DATA:  Initial evaluation for syncope, headaches. EXAM: MRA HEAD WITHOUT CONTRAST TECHNIQUE: Angiographic images of the Circle of Willis were acquired using MRA technique without intravenous contrast. COMPARISON:  Comparison made with prior brain MRI from 02/15/2023 FINDINGS: Anterior circulation: Both internal carotid arteries are widely patent through the siphons without stenosis or other abnormality. A1 segments patent bilaterally. Normal anterior communicating artery complex. Anterior cerebral arteries widely patent without stenosis. No M1 stenosis or occlusion. Normal MCA bifurcations. Distal MCA branches perfused and symmetric. No beading or irregularity. Posterior circulation: Both V4 segments patent without stenosis. Vertebral arteries are largely codominant. Neither PICA origin visualized. Basilar widely patent without stenosis. Superior cerebral arteries patent bilaterally. Both PCAs primarily supplied via the basilar well perfused or distal aspects without stenosis. No beading or irregularity. Anatomic variants: None significant. Other: No intracranial aneurysm or other vascular abnormality. IMPRESSION: Normal intracranial MRA. Electronically Signed   By: Rise Mu M.D.   On: 05/14/2023 20:37       Assessment & Plan Dizziness Associated with hypotension but need to confirm with home blood pressure reads Also has idiopathic intracranial hypertension, recurrent urinary tract infection (UTI) and dental infection, and hypotension with mild iron deficiency anemia as possible contributors Continue(s) with close follow up and reevaluate  Share with neurology these thoughts Of note hypotension preceded initiation of actazolamide  and is better since.. but we also treated suspected dental infection(s)  History of UTI Drop off a urine sample and return for follow-up in 1-3 months. Dental infection Dental Infection A referral to a dental office is awaited for further evaluation and treatment. Abdominal pain and unusual sounds were reported with previous antibiotic (Augmentin), but currently, there is no dental pain. Keflex, which has fewer gastrointestinal side effects compared to Augmentin, is an option. Do not take antibiotics unless dental pain worsens. Prescribe Keflex to have on hand in case of infection recurrence. IIH (idiopathic intracranial hypertension) Idiopathic Intracranial Hypertension; reviewed visit she recently had with neurology with interpreter to help her understand, put AVS info in arabic for her. This condition is managed by a neurologist, with current treatment including acetazolamide (Diamox). A recent lumbar puncture was performed to check cerebrospinal fluid pressure. Eye pressure issues are reported, predating the epidural. An ophthalmology appointment is scheduled to assess eye pressure. Acetazolamide reduces cerebrospinal fluid pressure, which can affect eye pressure. Continue acetazolamide and attend the ophthalmology appointment on February 20th. Provide Arabic handouts for acetazolamide and Keflex, and [place] neurology notes for review. Hypotension, unspecified hypotension type Hypotension Intermittent dizziness is likely due to low blood pressure. Blood pressure was low during the last visit but has improved today. Occasional dizziness is reported, but blood pressure is not checked during these episodes. It is important to monitor blood pressure during dizzy spells to confirm if hypotension is the cause. Use an arm cuff blood pressure monitor during dizziness episodes, recommending Omron or Braun brands, and avoid wrist monitors. Send blood pressure readings via MyChart. Iron deficiency anemia,  unspecified iron deficiency anemia type Anemia Mild anemia is present with hemoglobin levels of 11.6 in September and 11.7 in January. Iron deficiency is noted but can be managed with diet. Long-standing anemia is reported despite consuming iron-rich foods. Other vitamin deficiencies contributing to anemia were discussed, along with the option of taking a multivitamin. Encourage consumption of iron-rich foods and consider a multivitamin if dietary changes are insufficient. Language barrier affecting health care  Used interpreter Printed AVS data in arabic.      Orders Placed During this Encounter:   Orders Placed This Encounter  Procedures   Urinalysis, Complete   Meds ordered this encounter  Medications   cephALEXin (KEFLEX) 500 MG capsule    Sig: Take 1 capsule (500 mg total) by mouth 3 (three) times daily.    Dispense:  30 capsule    Refill:  0       This document was synthesized by artificial intelligence (Abridge) using HIPAA-compliant recording of the clinical interaction;   We discussed the use of AI scribe software for clinical note transcription with the patient, who gave verbal consent to proceed.    Additional Info: This encounter employed state-of-the-art, real-time, collaborative documentation. The patient actively reviewed and assisted in updating their electronic medical record on a shared screen, ensuring transparency and facilitating joint problem-solving for the problem list, overview, and plan. This approach promotes accurate, informed care. The treatment plan was discussed and reviewed in detail, including medication safety, potential side effects, and all patient questions. We confirmed understanding and comfort with the plan. Follow-up instructions were established, including contacting the office for any concerns, returning if symptoms worsen, persist, or new symptoms develop, and precautions for potential emergency department visits.

## 2023-07-16 ENCOUNTER — Encounter: Payer: Self-pay | Admitting: Internal Medicine

## 2023-07-16 DIAGNOSIS — R42 Dizziness and giddiness: Secondary | ICD-10-CM | POA: Insufficient documentation

## 2023-07-16 DIAGNOSIS — Z603 Acculturation difficulty: Secondary | ICD-10-CM | POA: Insufficient documentation

## 2023-07-16 LAB — URINALYSIS, COMPLETE
Bacteria, UA: NONE SEEN /[HPF]
Bilirubin Urine: NEGATIVE
Glucose, UA: NEGATIVE
Hgb urine dipstick: NEGATIVE
Hyaline Cast: NONE SEEN /[LPF]
Ketones, ur: NEGATIVE
Leukocytes,Ua: NEGATIVE
Nitrite: NEGATIVE
Protein, ur: NEGATIVE
RBC / HPF: NONE SEEN /[HPF] (ref 0–2)
Specific Gravity, Urine: 1.023 (ref 1.001–1.035)
pH: 6 (ref 5.0–8.0)

## 2023-07-16 NOTE — Assessment & Plan Note (Signed)
 Idiopathic Intracranial Hypertension; reviewed visit she recently had with neurology with interpreter to help her understand, put AVS info in arabic for her. This condition is managed by a neurologist, with current treatment including acetazolamide (Diamox). A recent lumbar puncture was performed to check cerebrospinal fluid pressure. Eye pressure issues are reported, predating the epidural. An ophthalmology appointment is scheduled to assess eye pressure. Acetazolamide reduces cerebrospinal fluid pressure, which can affect eye pressure. Continue acetazolamide and attend the ophthalmology appointment on February 20th. Provide Arabic handouts for acetazolamide and Keflex, and [place] neurology notes for review.

## 2023-07-16 NOTE — Assessment & Plan Note (Signed)
 Used interpreter Printed AVS data in arabic.

## 2023-07-16 NOTE — Assessment & Plan Note (Signed)
 Anemia Mild anemia is present with hemoglobin levels of 11.6 in September and 11.7 in January. Iron deficiency is noted but can be managed with diet. Long-standing anemia is reported despite consuming iron-rich foods. Other vitamin deficiencies contributing to anemia were discussed, along with the option of taking a multivitamin. Encourage consumption of iron-rich foods and consider a multivitamin if dietary changes are insufficient.

## 2023-07-16 NOTE — Assessment & Plan Note (Signed)
 Associated with hypotension but need to confirm with home blood pressure reads Also has idiopathic intracranial hypertension, recurrent urinary tract infection (UTI) and dental infection, and hypotension with mild iron deficiency anemia as possible contributors Continue(s) with close follow up and reevaluate  Share with neurology these thoughts Of note hypotension preceded initiation of actazolamide and is better since.. but we also treated suspected dental infection(s)

## 2023-08-02 ENCOUNTER — Other Ambulatory Visit: Payer: Self-pay

## 2023-08-02 NOTE — Patient Outreach (Signed)
  Medicaid Managed Care Social Work Note  08/02/2023 Name:  Jacqueline Miranda MRN:  [id number] DOB:  04/01/2001  Jacqueline Miranda is an 23 y.o. year old female who is a primary patient of Lula Olszewski, MD.  The [facility] Managed Care Coordination team was consulted for assistance with:   dental resources  Ms. Layne was given information about Medicaid Managed Care Coordination team services today. Jacqueline Miranda Patient agreed to services and verbal consent obtained.  Engaged with patient  for by telephone forfollow up visit in response to referral for case management and/or care coordination services.   Patient is participating in a Managed Medicaid Plan:  Yes  Assessments/Interventions:  Review of past medical history, allergies, medications, health status, including review of consultants reports, laboratory and other test data, was performed as part of comprehensive evaluation and provision of chronic care management services.  SDOH: (Social Drivers of Health) assessments and interventions performed: SDOH Interventions    Flowsheet Row Office Visit from [facility]/18/2023 in [facility] [place]  SDOH Interventions   Depression Interventions/Treatment  PHQ2-9 Score <4 Follow-up Not Indicated     BSW completed a telephone outreach with patient using interpreter, she states she will look for the resources BSW sent for dental resources. BSW will resend just in case she did not receive them. Patient states no other resources are needed at this time. BSW provided patient with telephone number for any future needs.   Advanced Directives Status:  Not addressed in this encounter.  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   Follow up:  Patient agrees to Care Plan and Follow-up.  Plan: The  Patient has been provided with contact information for the Managed Medicaid care management team and has been advised to call with any health related questions or concerns.    Abelino Derrick, MHA [facility]  Managed [facility] Social Worker [phone]

## 2023-08-02 NOTE — Patient Instructions (Signed)
 Visit Information  Jacqueline Miranda was given information about Medicaid Managed Care team care coordination services as a part of their [facility] Community Plan Medicaid benefit. Jacqueline Miranda verbally consented to engagement with the [facility] Managed Care team.   If you are experiencing a medical emergency, please call 911 or report to your local emergency department or urgent care.   If you have a non-emergency medical problem during routine business hours, please contact your provider's office and ask to speak with a nurse.   For questions related to your [facility], please call: [phone] or visit the homepage here: [email or web address]  If you would like to schedule transportation through your [facility], please call the following number at least 2 days in advance of your appointment: [phone]   Rides for urgent appointments can also be made after hours by calling Member Services.  Call the Behavioral Health Crisis Line at [phone], at any time, 24 hours a day, 7 days a week. If you are in danger or need immediate medical attention call 911.  If you would like help to quit smoking, call 1-800-QUIT-NOW ((575)660-6442) OR Espaol: 1-855-Djelo-Ya (4-401-027-2536) o para ms informacin haga clic aqu or Text READY to 644-034 to register via text  Jacqueline Miranda - following are the goals we discussed in your visit today:   Goals Addressed   None      The  Patient                                              has been provided with contact information for the Managed Medicaid care management team and has been advised to call with any health related questions or concerns.   Jacqueline Miranda, Jacqueline Miranda, MHA [facility]  Managed Medicaid Social Worker [phone]   Following is a copy of your plan of care:  There are no care plans that you recently modified to display for this  patient.

## 2023-08-15 ENCOUNTER — Encounter
Payer: Medicaid Other | Attending: Physical Medicine and Rehabilitation | Admitting: Physical Medicine and Rehabilitation

## 2023-08-15 VITALS — BP 113/75 | HR 57 | Ht 65.0 in | Wt 148.0 lb

## 2023-08-15 DIAGNOSIS — M542 Cervicalgia: Secondary | ICD-10-CM | POA: Diagnosis present

## 2023-08-15 DIAGNOSIS — M545 Low back pain, unspecified: Secondary | ICD-10-CM | POA: Insufficient documentation

## 2023-08-15 DIAGNOSIS — G8929 Other chronic pain: Secondary | ICD-10-CM | POA: Insufficient documentation

## 2023-08-15 MED ORDER — LIDOCAINE 5 % EX PTCH: 1.0000 | MEDICATED_PATCH | CUTANEOUS | 0 refills | Status: DC

## 2023-08-15 NOTE — Patient Instructions (Signed)
-  Discussed following foods that may reduce pain: 1) Ginger (especially studied for arthritis)- reduce leukotriene production to decrease inflammation 2) Blueberries- high in phytonutrients that decrease inflammation 3) Salmon- marine omega-3s reduce joint swelling and pain 4) Pumpkin seeds- reduce inflammation 5) dark chocolate- reduces inflammation 6) turmeric- reduces inflammation 7) tart cherries - reduce pain and stiffness 8) extra virgin olive oil - its compound olecanthal helps to block prostaglandins  9) chili peppers- can be eaten or applied topically via capsaicin 10) mint- helpful for headache, muscle aches, joint pain, and itching 11) garlic- reduces inflammation  Link to further information on diet for chronic pain: [email or web address]

## 2023-08-15 NOTE — Progress Notes (Signed)
 Subjective:    Patient ID: Jacqueline Miranda, female    DOB: 12-Jun-2000, 23 y.o.   MRN: [id number]  HPI [name] is 23 year old woman who presents for follow-up low back pain.  1) Chronic low back pain -pain is about the same -she is not taking any medications currently -started 2 years ago when she delivered her baby -went to gym and therapy and these did not help -she has tried tylenol and this did not help -she has tried ibuprofen and this did not help -has tried hot water and ice and these have helped -she has not tried tens unit  2) Neck pain -present since she delivered her baby  Pain Inventory Average Pain 0 Pain Right Now 0 My pain is  .  In the last 24 hours, has pain interfered with the following? General activity  . Relation with others  . Enjoyment of life  . What TIME of day is your pain at its worst? daytime, evening, and night Sleep (in general) Poor  Pain is worse with: walking, bending, sitting, inactivity, standing, and some activites Pain improves with:  nothing Relief from Meds: 0    Family History  Problem Relation Age of Onset   Hepatitis B Father    Asthma Mother    Social History   Socioeconomic History   Marital status: Single    Spouse name: Not on file   Number of children: Not on file   Years of education: Not on file   Highest education level: GED or equivalent  Occupational History   Not on file  Tobacco Use   Smoking status: Never   Smokeless tobacco: Never  Vaping Use   Vaping status: Never Used  Substance and Sexual Activity   Alcohol use: Never   Drug use: Never   Sexual activity: Not Currently  Other Topics Concern   Not on file  Social History Narrative   Father is Arob Scientist, physiological   Mother is [place] Careers information officer       Refugee Information   Number of Immediate Family Members: 8   Number of Immediate Family Members in [place]: 8   Date of Arrival: 02/12/20   Country of Birth: [place]   Other Country of Origin:: [place]   Location  of Refugee [place]: Other   Other Location of Refugee Camp:: [place]   Duration in [place]: 11-15 years   Reason for Leaving Home Country: Religion   Primary Language: Other   Other Primary Language:: Dinka   Able to Read in Primary Language: No   Able to Write in Primary Language: No   Education: Primary School   Marital Status: Single   Tuberculosis Screening [place]: Negative   Health Department Labs Completed: Yes   Do You Feel Jumpy or Nervous?: No   Are You Very Watchful or 'Super Alert'?: No   Social Drivers of Health   Financial Resource Strain: High Risk (02/17/2023)   Overall Financial Resource Strain (CARDIA)    Difficulty of Paying Living Expenses: Hard  Food Insecurity: No Food Insecurity (02/17/2023)   Hunger Vital Sign    Worried About Running Out of Food in the Last Year: Never true    Ran Out of Food in the Last Year: Never true  Transportation Needs: Unmet Transportation Needs (02/17/2023)   PRAPARE - Transportation    Lack of Transportation (Medical): No    Lack of Transportation (Non-Medical): Yes  Physical Activity: Insufficiently Active (02/17/2023)   Exercise Vital Sign  Days of Exercise per Week: 3 days    Minutes of Exercise per Session: 30 min  Stress: No Stress Concern Present (02/17/2023)   [employer] of Occupational Health - Occupational Stress Questionnaire    Feeling of Stress : Not at all  Social Connections: Socially Integrated (02/17/2023)   Social Connection and Isolation Panel [NHANES]    Frequency of Communication with Friends and Family: More than three times a week    Frequency of Social Gatherings with Friends and Family: Twice a week    Attends Religious Services: More than 4 times per year    Active Member of Clubs or Organizations: Yes    Attends Engineer, structural: More than 4 times per year    Marital Status: Living with partner   Past Surgical History:  Procedure Laterality Date   TONSILLECTOMY     Past Medical  History:  Diagnosis Date   Anemia    Anemia in pregnancy 06/28/2020   Rx iron     Toothache 04/21/2020   BP 113/75   Pulse (!) 57   Ht 5\' 5"  (1.651 m)   Wt 148 lb (67.1 kg)   SpO2 98%   BMI 24.63 kg/m   Opioid Risk Score:   Fall Risk Score:  `1  Depression screen [facility] 2/9     07/15/2023    9:37 AM 06/10/2023    9:42 AM 05/03/2023    1:53 PM 03/04/2023    9:51 AM 02/18/2023    8:57 AM 03/29/2022    1:46 PM 05/19/2021   10:03 AM  Depression screen PHQ 2/9  Decreased Interest 0 0 0 0 0 0 0  Down, Depressed, Hopeless 0 0 2 0 0 0 0  PHQ - 2 Score 0 0 2 0 0 0 0  Altered sleeping  1 2 0 0  3  Tired, decreased energy  1 3 1 2 2 1   Change in appetite  0 0 0 0 0 0  Feeling bad or failure about yourself   0 0 0 0 0 0  Trouble concentrating  0 0 0 0 0 0  Moving slowly or fidgety/restless  0 0 0 0 0 0  Suicidal thoughts  0 0 0 0 0 0  PHQ-9 Score  2 7 1 2  4   Difficult doing work/chores  Somewhat difficult  Somewhat difficult Not difficult at all       Review of Systems  Constitutional:  Positive for unexpected weight change.       Gained weight  HENT: Negative.    Eyes: Negative.   Respiratory:  Positive for shortness of breath.   Cardiovascular: Negative.   Gastrointestinal:  Positive for constipation.  Endocrine: Negative.   Genitourinary: Negative.   Musculoskeletal:  Positive for back pain and neck pain.  Skin: Negative.   Allergic/Immunologic: Negative.   Neurological:  Positive for dizziness and weakness.  Hematological: Negative.   Psychiatric/Behavioral:  Positive for dysphoric mood.   All other systems reviewed and are negative.      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  -discussed lidocaine patch -discussed that she is not taking any medications at this time -discussed that she has had Xrs that showed no pathology -Discussed current symptoms of pain and history of pain.  -Discussed benefits of exercise in reducing pain. -Discussed following foods that may reduce pain: 1) Ginger (especially studied for arthritis)- reduce leukotriene production to decrease inflammation 2) Blueberries- high in phytonutrients that decrease inflammation 3) Salmon- marine omega-3s reduce joint swelling and pain 4) Pumpkin seeds- reduce inflammation 5) dark chocolate- reduces inflammation 6) turmeric- reduces inflammation 7) tart cherries - reduce pain and stiffness 8) extra virgin olive oil - its compound olecanthal helps to block prostaglandins  9) chili peppers- can be eaten or applied topically via capsaicin 10) mint- helpful for headache, muscle aches, joint pain, and itching 11) garlic- reduces inflammation  Link to further information on diet for chronic pain: [email or web address]    2) Chronic neck pain -discussed XR -discussed that tens unit was too expensive for her

## 2023-08-17 ENCOUNTER — Telehealth: Payer: Self-pay

## 2023-08-17 NOTE — Telephone Encounter (Signed)
 Approved today by [facility] 2017 NCPDP Request Reference Number: [id number]. LIDOCAINE PAD 5% is approved through 08/16/2024. Effective Date: 08/17/2023 Authorization Expiration Date: 08/16/2024

## 2023-08-17 NOTE — Telephone Encounter (Signed)
(  Key: [id number]) PA Case ID #: [id number] Rx #: [id number] Submitted for Lidocaine patches

## 2023-09-30 ENCOUNTER — Ambulatory Visit: Payer: Medicaid Other | Admitting: Neurology

## 2023-09-30 ENCOUNTER — Encounter: Payer: Self-pay | Admitting: Neurology

## 2023-09-30 VITALS — BP 97/64 | HR 72 | Ht 66.0 in | Wt 147.0 lb

## 2023-09-30 DIAGNOSIS — G932 Benign intracranial hypertension: Secondary | ICD-10-CM

## 2023-09-30 DIAGNOSIS — R55 Syncope and collapse: Secondary | ICD-10-CM

## 2023-09-30 MED ORDER — ACETAZOLAMIDE 250 MG PO TABS: ORAL_TABLET | ORAL | 3 refills | Status: DC

## 2023-09-30 NOTE — Patient Instructions (Addendum)
????? ??????.  ?. ????? ????? ???????????? ??? ???: ????? ????? ?????? ????? ??????.  ?. ????? ??? ?????.  ?. ????? ???? ??? ???? ?? ?????? ????????. ??? ??? ??????? ?????? (??? ?? ??/??)? ????? ??????? ???? ??????? ???????. ???? ??? ????? ???? ?????? ?? ???? ??????? ??????? ?????.  ?. ?????? ??? ? ????? ???????? ??? ???????.     srrt biruyatika. 1. 'iieadat tanawul asitazulamid 250 mulaghi: tanawal qrsan wahdan maratayn ywmyan. 2. ziadat shurb alma'i. 3. yunsh bifahs daght aldam fi almanzil wamuraqabatihi. 'iidha kan mnkhfdan dayman ('aqala min 90/60), yurja aistisharat tabib alrieayat al'awaliati. naqash 'alam alsadr wadiq altanafus mae tabib alrieayat al'awaliat aydan. 4. mutabaeat baed 4 'ashhuru, walaitisal bi'ayi taghyiratin.  Good to see you.  Restart Acetazolamide  250mg : take 1 tablet twice a day  2. Increase hydration  3. Recommend checking BP at home and monitoring, if always low (less than 90/60), contact PCP. Discuss chest pain and shortness of breath with PCP also  4. Follow-up in 4 months, call for any changes

## 2023-09-30 NOTE — Progress Notes (Signed)
 NEUROLOGY FOLLOW UP OFFICE NOTE  Jacqueline Miranda [id number] 05/21/2001  HISTORY OF PRESENT ILLNESS: I had the pleasure of seeing Jacqueline Miranda in follow-up in the neurology clinic on 09/30/2023.  The patient was last seen 3 months ago for pseudotumor cerebri and syncope. She is alone in the office. An Arabic medical interpreter Jacqueline Miranda helps with the visit. Records and images were personally reviewed where available. On her last visit, she reported continued vision changes, worse on the left. Diamox  250mg  increased to 2 in AM, 1 in PM. Repeat eye exam on 07/2023 showed dramatic improvement with no further papilledema seen. She reports that she stopped Diamox  because there were no further refills since March but did not inform our office. She reports the 3 tablets daily was "very strong," she was having numbness in her hands and legs. She reports vision is still sometimes off, like something is in her eye (pain in eye). The headaches are better, but she still has 1-2 times a week with associated dizziness. Ibuprofen  helps. BP today is 97/64, she is worried this is contributing to the chest pressure and shortness of breath she had yesterday and 2 days before, none today. No further syncope since July 2024.    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 She speaks some [place], the interpreter translates in Arabic as well.   She reports that she has had only one full episode of loss of consciousness that occurred in July. She was having a headache and recalls going up the stairs. She felt dizzy then woke up on the floor. Her mother found her, no convulsive activity reported. No tongue bite or incontinence. She woke up with the headache  and dizziness. She did not seek care that time. She then reports another episode of near syncope in August, she again had a headache and dizziness, she sat down and repeatedly says she did not pass out this time. It took more than an hour to feel better. No further similar instances since then, however she continues to report headache and dizziness. These started after she delivered by baby girl 2.5 years ago. She states she has been staying away from sugar recently and she feels better, with headaches occurring around 2 days a week. Sometimes they occur once a week, other times they last all week. There is no nausea/vomiting but she spits a lot. She is sensitive to lights with them. Sometimes she cannot see clearly, L>R right eye. She takes Celecoxib  which helps. When she has a headache, she has dizziness where she feels like she cannot control her body. She demonstrates slight hand tremors that occur with them. No spinning sensation. She has constant numbness in both hands and feet, sometimes the numbness moves up her right arm. She has neck and back pain. No bowel/bladder dysfunction.  She started having left eye issues in 2021 and [place] a doctor out of state who told her she had a virus and prescribed medication which made her feel better. She then moved to [place] 2 weeks later and since then eye pain has recurred. Sometimes there is itching. She sometimes notices ?tinnitus on the right ear when lying on her right side, sometimes it is pulsatile or a low-pitched sound. No dysarthria/dysphagia, bowel/bladder dysfunction. Her mother has headaches.  No family history of syncope. She denies any recent head injuries. She lives with 8 people in their home. She is unemployed, studying for her GED. She is driving.   I personally reviewed brain MRI with and without contrast done 02/22/23 which did not show any acute changes. There was note of partially empty sella turcica, mild cerebellar tonsillar ectopia, narrowed  appearance of the distal transverse dural venous sinuses bilaterally.  Lumbar puncture in 04/2023 showed an elevated opening pressure of 30 cmH2O.   MRI lumbar spine 01/2023 without contrast normal.   PAST MEDICAL HISTORY: Past Medical History:  Diagnosis Date   Anemia    Anemia in pregnancy 06/28/2020   Rx iron     Toothache 04/21/2020    MEDICATIONS: Current Outpatient Medications on File Prior to Visit  Medication Sig Dispense Refill   ibuprofen  (ADVIL ) 200 MG tablet Take 200 mg by mouth every 6 (six) hours as needed.     lidocaine  (LIDODERM ) 5 % Place 1 patch onto the skin daily. Remove & Discard patch within 12 hours or as directed by MD 30 patch 0   acetaminophen  (TYLENOL ) 325 MG tablet Take 2 tablets (650 mg total) by mouth every 4 (four) hours as needed (for pain scale < 4). (Patient not taking: Reported on 09/30/2023) 100 tablet 0   acetaZOLAMIDE  (DIAMOX ) 250 MG tablet Take 2 tablets in AM, 1 tablet in PM (Patient not taking: Reported on 09/30/2023)     amoxicillin -clavulanate (AUGMENTIN ) 875-125 MG tablet Take 1 tablet by mouth 2 (two) times daily. (Patient not taking: Reported on 09/30/2023) 20 tablet 0   celecoxib  (CELEBREX ) 200 MG capsule Take 1 capsule (200 mg total) by mouth 2 (two) times daily. (Patient not taking: Reported on 09/30/2023) 30 capsule 1   cephALEXin  (KEFLEX ) 500 MG capsule Take 1 capsule (500 mg total) by mouth 3 (three) times daily. (Patient not taking: Reported on 09/30/2023) 30 capsule 0   methocarbamol  (ROBAXIN ) 500 MG tablet Take 1 tablet (500 mg total) by mouth 4 (four) times daily. (Patient not taking: Reported on 09/30/2023) 60 tablet 2   No current facility-administered medications on file prior to visit.    ALLERGIES: No Known Allergies  FAMILY HISTORY: Family History  Problem Relation Age of Onset   Hepatitis B Father    Asthma Mother     SOCIAL HISTORY: Social History   Socioeconomic History   Marital status: Single    Spouse name: Not on  file   Number of children: Not on file   Years of education: Not on file   Highest education level: GED or equivalent  Occupational History   Not on file  Tobacco Use   Smoking status: Never   Smokeless tobacco: Never  Vaping Use   Vaping status: Never Used  Substance and Sexual Activity   Alcohol use: Never   Drug use: Never   Sexual activity: Not Currently  Other Topics Concern   Not on file  Social History Narrative   Father is Arob Scientist, physiological   Mother is [employer] Care[employer] information officer       Refugee Information   Number of Immediate Family Members: 8   Number of Immediate Family Members in [place] : 8   Date of Arrival: 02/12/20   Country of Birth: [place]   Other Country of Origin:: [place]   Location of Refugee Camp: Other   Other Location of Refugee Camp:: [place]   Duration in [place]: 11-15 years   Reason for Leaving Home Country: Religion  Primary Language: Other   Other Primary Language:: D[place]a   Able to Read in Primary Language: No   Able to Write in Primary Language: No   Education: Primary School   Marital Status: Single   Tuberculosis Screening Overseas: Negative   Health Department Labs Completed: Yes   Do You Feel Jumpy or Nervous?: No   Are You Very Watchful or 'Super Alert'?: No   Social Drivers of Health   Financial Resource Strain: High Risk (02/17/2023)   Overall Financial Resource Strain (CARDIA)    Difficulty of Paying Living Expenses: Hard  Food Insecurity: No Food Insecurity (02/17/2023)   Hunger Vital Sign    Worried About Running Out of Food in the Last Year: Never true    Ran Out of Food in the Last Year: Never true  Transportation Needs: Unmet Transportation Needs (02/17/2023)   PRAPARE - Transportation    Lack of Transportation (Medical): No    Lack of Transportation (Non-Medical): Yes  Physical Activity: Insufficiently Active (02/17/2023)   Exercise Vital Sign    Days of Exercise per Week: 3 days    Minutes of Exercise per Session: 30 min  Stress: No Stress  Concern Present (02/17/2023)   [employer] of Occupational Health - Occupational Stress Questionnaire    Feeling of Stress : Not at all  Social Connections: Socially Integrated (02/17/2023)   Social Connection and Isolation Panel [NHANES]    Frequency of Communication with Friends and Family: More than three times a week    Frequency of Social Gatherings with Friends and Family: Twice a week    Attends Religious Services: More than 4 times per year    Active Member of [employer] or Organizations: Yes    Attends Engineer, structural: More than 4 times per year    Marital Status: Living with partner  Intimate Partner Violence: Not on file     PHYSICAL EXAM: Vitals:   09/30/23 1314  BP: 97/64  Pulse: 72  SpO2: 97%   General: No acute distress Head:  Normocephalic/atraumatic Skin/Extremities: No rash, no edema Neurological Exam: alert and awake. No aphasia or dysarthria. Fund of knowledge is appropriate.  Attention and concentration are normal.   Cranial nerves: Pupils equal, round. Extraocular movements intact with no nystagmus. Visual fields full.  No facial asymmetry.  Motor: Bulk and tone normal, muscle strength 5/5 throughout with no pronator drift.   Finger to nose testing intact.  Gait narrow-based and steady, able to tandem walk adequately.  Romberg negative.   IMPRESSION: This is a pleasant 23 yo RH woman with a history of anemia, with idiopathic intracranial hypertension (IIH) and syncope. 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 Lumbar puncture showed elevated opening pressure of 30 cmH2O. Ophthalmology confirmed bilateral mild papilledema that resolved on last eye exam 07/2023 after increase in Diamox  dose. She reports running out of refills since March, with occasional headaches and vision changes. Advised to restart Diamox , she felt 3 tablets daily was too  much, restart 250mg  BID and monitor symptoms. No further syncopal episodes since 11/2022, EEG normal. BP today on lower side, she is concerned this is causing the chest tightness and shortness of breath the past 2 days, advised to monitor BP at home and follow-up with PCP. Follow-up in 4 months, call for any changes.   Thank you for allowing me to participate in her care.  Please do not hesitate to call for  any questions or concerns.    Rayfield Cairo, M.D.   CC: Dr. Boston Byers

## 2023-10-14 ENCOUNTER — Ambulatory Visit: Payer: Medicaid Other | Admitting: Internal Medicine

## 2023-10-14 ENCOUNTER — Encounter: Payer: Self-pay | Admitting: Internal Medicine

## 2023-10-14 VITALS — BP 90/60 | HR 75 | Temp 98.0°F | Ht 66.0 in | Wt 144.2 lb

## 2023-10-14 DIAGNOSIS — M545 Low back pain, unspecified: Secondary | ICD-10-CM | POA: Diagnosis not present

## 2023-10-14 DIAGNOSIS — M546 Pain in thoracic spine: Secondary | ICD-10-CM

## 2023-10-14 DIAGNOSIS — D509 Iron deficiency anemia, unspecified: Secondary | ICD-10-CM

## 2023-10-14 DIAGNOSIS — R7301 Impaired fasting glucose: Secondary | ICD-10-CM

## 2023-10-14 DIAGNOSIS — M4124 Other idiopathic scoliosis, thoracic region: Secondary | ICD-10-CM

## 2023-10-14 DIAGNOSIS — Z603 Acculturation difficulty: Secondary | ICD-10-CM

## 2023-10-14 DIAGNOSIS — R42 Dizziness and giddiness: Secondary | ICD-10-CM

## 2023-10-14 DIAGNOSIS — R55 Syncope and collapse: Secondary | ICD-10-CM

## 2023-10-14 DIAGNOSIS — G932 Benign intracranial hypertension: Secondary | ICD-10-CM

## 2023-10-14 DIAGNOSIS — N946 Dysmenorrhea, unspecified: Secondary | ICD-10-CM

## 2023-10-14 DIAGNOSIS — H539 Unspecified visual disturbance: Secondary | ICD-10-CM | POA: Diagnosis not present

## 2023-10-14 DIAGNOSIS — R1084 Generalized abdominal pain: Secondary | ICD-10-CM

## 2023-10-14 DIAGNOSIS — E236 Other disorders of pituitary gland: Secondary | ICD-10-CM

## 2023-10-14 DIAGNOSIS — G8929 Other chronic pain: Secondary | ICD-10-CM

## 2023-10-14 NOTE — Progress Notes (Signed)
 ==============================  [facility]: [phone]   -- Medical Office Visit --  Patient: Jacqueline Miranda      Age: 23 y.o.       Sex:  female  Date:   10/14/2023 Today's Healthcare Provider: Anthon Kins, MD  ==============================   Chief Complaint: Anemia, Chronic back pain, Eye Problem (Has been having vision problems.), and Abdominal Pain (Pt states she has pain comes and goes at time has periods not on times. Sometimes it last two days at a time.)  Discussed the use of AI scribe software for clinical note transcription with the patient, who gave verbal consent to proceed.  History of Present Illness History of Present Illness 23 year old female presenting for routine follow-up with multiple concerns including vision problems, anemia, back pain, and abdominal pain. Vision Problems: Patient reports onset in August 2021, initially affecting the left eye with symptoms of blurred vision and a gray spot. No associated pain. Progression to right eye noted in 2023. Current symptoms include floaters ("fly dots"), sensation of grittiness/sand in eyes, occasional redness, and itchiness. Prior evaluation in [place] suggested possible environmental pathogen exposure. Patient reports normal intracranial MRA completed December 2024. Abdominal/Menstrual Pain: Patient describes severe flank pain radiating to the uterus, occurring cyclically with menstruation, specifically on the first and second days of her period. Pain is described as severe but not associated with urination. Menstrual cycles are reported as regular and monthly. Back Pain: Patient reports pain from mid-thoracic region to cervical spine, exacerbated by prolonged sitting and reading. No history of trauma or recent fever. Pain described as severe, particularly when holding reading materials. Symptom onset approximately May 2022. Anemia: Previously diagnosed with iron deficiency anemia. Reports  intermittent symptoms of restless legs. No iron supplementation currently, as previous provider indicated levels were near normal range. Iron levels have not been checked in several years. Past Medical History: Includes idiopathic intracranial hypertension, empty sella turcica, syncope, migraine, impaired fasting glucose, scoliosis, dysmenorrhea, and previous anemia in pregnancy (05/2020). Recent Vital Signs: BP 90/60, HR 75, SpO2 98%, Weight 144 lbs (BMI 23.29), showing slight weight decrease from previous visits. Recent Labs: HbA1c 5.8% (06/10/2023), previous 5.5% (02/18/2023) Imaging: MRI showing increased pressure in the spinal cord, MRA normal. Prior PHQ scores reviewed, showing no current depression concerns (most recent score 0).      Background Reviewed: Problem List: has Leg pain, bilateral; Positive RPR test; Acute reaction to situational stress; Chronic back pain; Stomach pain; Syncope; Migraine; Iron deficiency anemia; Impaired fasting glucose; Chronic neck pain; Scoliosis; IIH (idiopathic intracranial hypertension); Empty sella turcica (HCC); Dizziness; Language barrier affecting health care; Vision abnormalities; and Dysmenorrhea on their problem list. Past Medical History:  has a past medical history of Anemia, Anemia in pregnancy (06/28/2020), and Toothache (04/21/2020). Past Surgical History:   has a past surgical history that includes Tonsillectomy. Social History:   reports that she has never smoked. She has never used smokeless tobacco. She reports that she does not drink alcohol and does not use drugs. Family History:  family history includes Asthma in her mother; Hepatitis B in her father. Allergies:  has no known allergies.   Medication Reconciliation: Current Outpatient Medications on File Prior to Visit  Medication Sig   acetaZOLAMIDE  (DIAMOX ) 250 MG tablet Take 1 tablet twice a day   ibuprofen  (ADVIL ) 200 MG tablet Take 200 mg by mouth every 6 (six) hours as needed.    acetaminophen  (TYLENOL ) 325 MG tablet Take 2 tablets (650 mg total)  by mouth every 4 (four) hours as needed (for pain scale < 4). (Patient not taking: Reported on 06/28/2023)   amoxicillin -clavulanate (AUGMENTIN ) 875-125 MG tablet Take 1 tablet by mouth 2 (two) times daily. (Patient not taking: Reported on 06/28/2023)   celecoxib  (CELEBREX ) 200 MG capsule Take 1 capsule (200 mg total) by mouth 2 (two) times daily. (Patient not taking: Reported on 06/28/2023)   cephALEXin  (KEFLEX ) 500 MG capsule Take 1 capsule (500 mg total) by mouth 3 (three) times daily. (Patient not taking: Reported on 10/14/2023)   lidocaine  (LIDODERM ) 5 % Place 1 patch onto the skin daily. Remove & Discard patch within 12 hours or as directed by MD (Patient not taking: Reported on 10/14/2023)   methocarbamol  (ROBAXIN ) 500 MG tablet Take 1 tablet (500 mg total) by mouth 4 (four) times daily. (Patient not taking: Reported on 06/28/2023)   No current facility-administered medications on file prior to visit.  There are no discontinued medications.   Physical Exam:    10/14/2023    9:31 AM 09/30/2023    1:14 PM 08/15/2023    2:00 PM  Vitals with BMI  Height 5\' 6"  5\' 6"  5\' 5"   Weight 144 lbs 3 oz 147 lbs 148 lbs  BMI 23.29 23.74 24.63  Systolic 90 97 113  Diastolic 60 64 75  Pulse 75 72 57  Vital signs reviewed.  Nursing notes reviewed. Weight trend reviewed. Physical Exam General Appearance:  No acute distress appreciable.   Well-groomed, healthy-appearing female.  Well proportioned with no abnormal fat distribution.  Good muscle tone. Pulmonary:  Normal work of breathing at rest, no respiratory distress apparent. SpO2: 98 %  Musculoskeletal: All extremities are intact.  Neurological:  Awake, alert, oriented, and engaged.  No obvious focal neurological deficits or cognitive impairments.  Sensorium seems unclouded.   Speech is clear and coherent with logical content. Psychiatric:  Appropriate mood, pleasant and cooperative  demeanor, thoughtful and engaged during the exam Physical Exam Interpreter assists with translation      No results found for any visits on 10/14/23. No image results found. No results found.      07/15/2023    9:37 AM 06/10/2023    9:42 AM 05/03/2023    1:53 PM 03/04/2023    9:51 AM  PHQ 2/9 Scores  PHQ - 2 Score 0 0 2 0  PHQ- 9 Score  2 7 1    Results RADIOLOGY MRI: Increased pressure in the spinal cord MRA: Normal    Lab Results  Component Value Date   HGBA1C 5.8 06/10/2023   HGBA1C 5.5 02/18/2023     Assessment & Plan Vision abnormalities Assessment: Patient presents with progressive bilateral vision disturbance since 2021, initially affecting left eye and progressing to right eye in 2023. Symptoms include blurred vision, floaters, grittiness sensation, and occasional redness/itching. 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 Normal intracranial MRA (05/2023), but MRI shows increased spinal cord pressure. Previous evaluation in [place] suggested environmental pathogen, but treatment was discontinued due to relocation. Plan: Referral to neuro-ophthalmology for comprehensive evaluation Discussed differential diagnoses including idiopathic intracranial hypertension-related visual changes, ocular surface disease, and potential inflammatory conditions Continue acetazolamide  250mg  BID for IIH management Patient counseled regarding importance of specialist evaluation despite previous concerns about effectiveness Follow-up after neuro-ophthalmology consultation Generalized abdominal pain  Menstrual pain  IIH (idiopathic intracranial hypertension)  Dysmenorrhea Assessment: Severe cyclic flank pain radiating to uterus during first two days of menstrual cycle. Regular monthly periods. Pattern and distribution suggests  possible secondary dysmenorrhea. Differential diagnoses include endometriosis (including possible ureteral involvement),  adenomyosis, and pelvic congestion syndrome. Plan: Offer pelvic/transvaginal ultrasound to evaluate for structural causes Continue ibuprofen  200mg  q6h PRN for pain management Discussed hormonal management options including continuous oral contraceptives Patient currently reluctant to undergo imaging or surgical intervention Provided educational materials regarding endometriosis Symptom monitoring with detailed documentation of pain patterns Chronic bilateral low back pain without sciatica  Iron deficiency anemia, unspecified iron deficiency anemia type Assessment: History of iron deficiency anemia with reported restless leg symptoms. No current supplementation. No recent laboratory evaluation of iron status. Plan: Order comprehensive iron studies including ferritin, TIBC, and serum iron Dietary counseling regarding iron-rich foods provided Will consider supplementation based on laboratory results Symptom monitoring with attention to fatigue, restless legs, and exercise tolerance Language barrier affecting health care  Dizziness  Syncope, unspecified syncope type  Empty sella turcica (HCC)  Other idiopathic scoliosis, thoracic region  Chronic bilateral thoracic back pain Assessment: Chronic mid-thoracic to cervical pain exacerbated by prolonged sitting and reading. No history of trauma. MRI shows increased spinal cord pressure. Differential diagnoses include postural syndrome, myofascial pain, cervical disc disease, and possible arachnoiditis from previous procedures. Plan: Recommend physical therapy for evaluation and therapeutic exercise program Discussed ergonomic modifications for reading and computer use Continue acetaminophen  650mg  q4h PRN for mild pain Consider MRI cervical/thoracic spine with contrast if symptoms worsen Patient electing to prioritize eye condition management before addressing back pain Recommended massage therapy and acupuncture as adjunctive  options Impaired fasting glucose Assessment: Previous diagnosis of impaired fasting glucose. HbA1c trending upward: 5.5% (01/2023) to 5.8% (06/2023), indicating prediabetes. Plan: Provided English-language educational materials on prediabetes management Dietary counseling with emphasis on carbohydrate distribution and quality Recommended moderate physical activity for 150 minutes weekly Will recheck HbA1c in 6 months Discussed progression risk and prevention strategies Follow-up Plan Schedule follow-up after neuro-ophthalmology evaluation, approximately 2-3 months Sooner follow-up available for any worsening symptoms MyChart access provided for ongoing communication and educational materials Order laboratory studies prior to next visit Total encounter time: 40 minutes face-to-face  Time-Based Billing Attestation I spent 40 minutes personally providing face-to-face care for this patient during today's encounter. This extended time was medically necessary due to: Complexity of multiple chronic conditions requiring coordination of care Management of idiopathic intracranial hypertension with visual symptoms requiring specialty referral planning Evaluation of chronic pain conditions with multiple differential diagnoses Language barrier requiring additional time for clear communication and patient education Review and interpretation of previous diagnostic studies including MRI findings Detailed counseling regarding treatment options and specialist care The time included history gathering, examination, medical decision making, and counseling/coordination of care. The predominant portion of the visit (>50%) was spent on counseling and coordination of care due to the complexity of the patient's medical concerns. Based on total time of 40 minutes, this encounter meets criteria for CPT code 16109 (established patient, level 5) as the total time falls within the 40-54 minute range specified for this  service level. No separately billable procedures were performed during this encounter.      Orders Placed During this Encounter:   Orders Placed This Encounter  Procedures   Ambulatory referral to Ophthalmology    Referral Priority:   Urgent    Referral Type:   Consultation    Referral Reason:   Specialty Services Required    Requested Specialty:   Ophthalmology    Number of Visits Requested:   1      This document was synthesized by artificial intelligence (  [employer]) using HIPAA-compliant recording of the clinical interaction;   We discussed the use of AI scribe software for clinical note transcription with the patient, who gave verbal consent to proceed. additional Info: This encounter employed state-of-the-art, real-time, collaborative documentation. The patient actively reviewed and assisted in updating their electronic medical record on a shared screen, ensuring transparency and facilitating joint problem-solving for the problem list, overview, and plan. This approach promotes accurate, informed care. The treatment plan was discussed and reviewed in detail, including medication safety, potential side effects, and all patient questions. We confirmed understanding and comfort with the plan. Follow-up instructions were established, including contacting the office for any concerns, returning if symptoms worsen, persist, or new symptoms develop, and precautions for potential emergency department visits.

## 2023-10-14 NOTE — Assessment & Plan Note (Signed)
 Assessment: Previous diagnosis of impaired fasting glucose. HbA1c trending upward: 5.5% (01/2023) to 5.8% (06/2023), indicating prediabetes. Plan: Provided English-language educational materials on prediabetes management Dietary counseling with emphasis on carbohydrate distribution and quality Recommended moderate physical activity for 150 minutes weekly Will recheck HbA1c in 6 months Discussed progression risk and prevention strategies

## 2023-10-14 NOTE — Assessment & Plan Note (Signed)
 Assessment: Patient presents with progressive bilateral vision disturbance since 2021, initially affecting left eye and progressing to right eye in 2023. Symptoms include blurred vision, floaters, grittiness sensation, and occasional redness/itching. 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 Normal intracranial MRA (05/2023), but MRI shows increased spinal cord pressure. Previous evaluation in [place] suggested environmental pathogen, but treatment was discontinued due to relocation. Plan: Referral to neuro-ophthalmology for comprehensive evaluation Discussed differential diagnoses including idiopathic intracranial hypertension-related visual changes, ocular surface disease, and potential inflammatory conditions Continue acetazolamide  250mg  BID for IIH management Patient counseled regarding importance of specialist evaluation despite previous concerns about effectiveness Follow-up after neuro-ophthalmology consultation

## 2023-10-14 NOTE — Assessment & Plan Note (Signed)
 Assessment: History of iron deficiency anemia with reported restless leg symptoms. No current supplementation. No recent laboratory evaluation of iron status. Plan: Order comprehensive iron studies including ferritin, TIBC, and serum iron Dietary counseling regarding iron-rich foods provided Will consider supplementation based on laboratory results Symptom monitoring with attention to fatigue, restless legs, and exercise tolerance

## 2023-10-14 NOTE — Assessment & Plan Note (Signed)
 Assessment: Chronic mid-thoracic to cervical pain exacerbated by prolonged sitting and reading. No history of trauma. MRI shows increased spinal cord pressure. Differential diagnoses include postural syndrome, myofascial pain, cervical disc disease, and possible arachnoiditis from previous procedures. Plan: Recommend physical therapy for evaluation and therapeutic exercise program Discussed ergonomic modifications for reading and computer use Continue acetaminophen  650mg  q4h PRN for mild pain Consider MRI cervical/thoracic spine with contrast if symptoms worsen Patient electing to prioritize eye condition management before addressing back pain Recommended massage therapy and acupuncture as adjunctive options

## 2023-10-14 NOTE — Assessment & Plan Note (Signed)
 Assessment: Severe cyclic flank pain radiating to uterus during first two days of menstrual cycle. Regular monthly periods. Pattern and distribution suggests possible secondary dysmenorrhea. Differential diagnoses include endometriosis (including possible ureteral involvement), adenomyosis, and pelvic congestion syndrome. Plan: Offer pelvic/transvaginal ultrasound to evaluate for structural causes Continue ibuprofen  200mg  q6h PRN for pain management Discussed hormonal management options including continuous oral contraceptives Patient currently reluctant to undergo imaging or surgical intervention Provided educational materials regarding endometriosis Symptom monitoring with detailed documentation of pain patterns

## 2023-10-14 NOTE — Patient Instructions (Addendum)
 YOUR VISIT SUMMARY - Oct 14, 2023   Thank you for your visit today. Here is a summary of what we discussed and your care plan: YOUR HEALTH CONDITIONS   Condition What This Means  Vision Problems You are experiencing blurred vision, floaters, and irritation in both eyes. This may be related to increased pressure.  Menstrual Pain Your severe monthly pain may be caused by endometriosis or a similar condition that needs further evaluation.  Back Pain You have pain from your mid-back to neck that worsens with reading and sitting. This may be related to posture and muscle strain.  Arachnoiditis might explain it.  Anemia Your body may not have enough iron, which could cause restless legs and fatigue.  Prediabetes Your blood sugar levels are higher than normal but not yet in the diabetes range. This can be managed with lifestyle changes.   YOUR CARE PLAN   Need Action Plan  Vision Care You will be referred to a neuro-ophthalmologist (eye specialist) Continue taking acetazolamide  as prescribed Call our office if vision worsens or new symptoms develop  Pain Management Take ibuprofen  200mg  every 6 hours as needed for menstrual pain Consider pelvic ultrasound to check for endometriosis For back pain, improve posture when reading and sitting Consider physical therapy, massage, or acupuncture for back pain  Laboratory Tests Blood work to check iron levels will be ordered Follow instructions for fasting if required Results will be available through MyChart or at next visit  Lifestyle Changes Include iron-rich foods like lean meats, beans, and leafy greens Aim for 150 minutes of physical activity weekly Limit refined carbohydrates to help manage blood sugar Take frequent breaks when reading or using a computer   YOUR MEDICATIONS   Medication Dose Instructions  Acetazolamide  (Diamox ) 250 mg Take 1 tablet twice a day  Ibuprofen  (Advil ) 200 mg Take 1 tablet every 6 hours as needed for pain   Acetaminophen  (Tylenol ) 325 mg Take 2 tablets every 4 hours as needed for mild pain   SEEK IMMEDIATE MEDICAL ATTENTION if you experience: Sudden vision loss or dramatic change in vision Severe, persistent headache different from your usual headaches Severe abdominal pain that does not improve with medication Dizziness with fainting Fever over 101.88F     YOUR FOLLOW-UP CARE   Next Appointment: Please schedule a follow-up appointment after your neuro-ophthalmology evaluation, approximately 2-3 months from today.  Referrals: A referral to neuro-ophthalmology will be processed. Our office will contact you with appointment details.   ?? AVAILABLE IN MYCHART:  Complete Lab Results History - View how your values have changed over time in the "Test Results" section  Medication Management - Review your current medications and request refills in the "Medications" tab  Appointment Scheduling - Book your next visit online through the "Appointments" section  Secure Messaging - Send non-urgent questions about your results through "Messages"  Health Trends - Track your vitals and key lab values in the "Health Trends" section    ?? LOG IN TO MYCHART: Visit [email or web address] or download the MyChart app on your smartphone to access these features.  Please respond via MyChart or call our office at [phone] if you have any questions about these results. Wishing you continued good health,      Anthon Kins, MD Board Certified in Internal Medicine [facility] ?? [phone]  ?? [phone]  ?? [address] [place], [place] [address]          EDUCATIONAL MATERIALS   UNDERSTANDING  VISION PROBLEMS AND INCREASED PRESSURE What is Idiopathic Intracranial Hypertension (IIH)? IIH is a condition where pressure inside your skull increases without an obvious cause. This pressure can affect your optic nerve and vision. Common Symptoms: Blurry vision or  brief vision loss Seeing "floaters" or dark spots Feeling of pressure behind the eyes Headaches, often worse when lying down Ringing in the ears (pulsatile tinnitus) Managing Your Condition: Take medications like acetazolamide  exactly as prescribed Maintain a healthy weight (excess weight can worsen symptoms) Attend all eye appointments to monitor your vision Report any changes in vision promptly Consider keeping a symptom diary to track patterns What to Expect with a Neuro-ophthalmologist: This specialist will perform detailed eye exams, possibly including:  Visual field testing to check for blind spots Optical coherence tomography (OCT) to examine your optic nerve Examination of eye pressure and eye movements Possibly additional imaging studies Important Note: While waiting for your specialist appointment, contact your doctor immediately if you experience sudden vision changes or severe headaches.     UNDERSTANDING SEVERE MENSTRUAL PAIN AND ENDOMETRIOSIS What is Secondary Dysmenorrhea? While some menstrual pain is common, severe pain that interferes with daily activities could indicate an underlying condition like endometriosis or adenomyosis. About Endometriosis: Endometriosis occurs when tissue similar to the uterine lining grows outside the uterus. It can affect the ovaries, fallopian tubes, and pelvic tissues. Common Symptoms: Severe pelvic or abdominal pain during periods Pain that radiates to your back or sides Pain during or after intercourse Heavy bleeding during periods Fatigue and digestive issues during periods Management Options: Pain Relief:  Over-the-counter pain relievers (ibuprofen , naproxen ) Heat therapy with heating pads Gentle stretching exercises Hormonal Treatments:  Birth control pills can reduce pain and regulate periods Hormonal IUDs may reduce symptoms for some women Other hormonal therapies may be recommended by specialists Diagnostic Procedures:   Ultrasound can identify some cases of endometriosis Laparoscopy (minimally invasive surgery) can provide definitive diagnosis Self-Care Tips: Track your symptoms in a pain diary to identify patterns Start pain medication 1-2 days before your period begins Try gentle yoga or stretching exercises Consider dietary changes that may help (reducing inflammatory foods) Join a support group for women with endometriosis     MANAGING CHRONIC BACK AND NECK PAIN Understanding Your Pain: Chronic back and neck pain can result from multiple factors including posture, muscle strain, disc issues, or spinal pressure changes. Ergonomic Tips for Reading and Computer Use: Reading Position:  Hold books at eye level instead of looking down Use a book stand or pillow to prop up reading materials Take a 5-minute break every 20-30 minutes Consider e-readers which can be lighter to Investment banker, operational:  Position your monitor at eye level Keep your keyboard at elbow height Sit with your back supported and feet flat on the floor Use a chair with good lumbar support Self-Care Strategies: Gentle Stretching: Try cat-cow stretches, gentle neck rotations, and shoulder rolls Strengthening: Focus on core muscles which support the spine Heat Therapy: Apply for 15-20 minutes to relax muscles Cold Therapy: Apply for 10-15 minutes to reduce inflammation Mindfulness: Stress can increase muscle tension; relaxation techniques may help When to Seek Additional Help: If pain worsens or spreads to arms or legs If you experience numbness or tingling If pain interferes with sleep or daily activities If you develop weakness in arms or legs Treatment Options to Consider: Physical Therapy: Customized exercises and manual techniques Massage Therapy: Can reduce muscle tension and improve circulation Acupuncture: May help with pain management for some people  Pain Management: Various medication and injection options      MANAGING PREDIABETES What is Prediabetes? Prediabetes means your blood sugar levels are higher than normal but not yet high enough to be diagnosed as type 2 diabetes. Without lifestyle changes, prediabetes often progresses to type 2 diabetes. Understanding Your Numbers: Normal A1C: Below 5.7% Prediabetes A1C: 5.7% to 6.4% Diabetes A1C: 6.5% or higher Your recent A1C: 5.8% (indicates prediabetes) Good News: Prediabetes can often be reversed with lifestyle changes! Dietary Recommendations: Carbohydrate Management:  Choose complex carbs (whole grains, legumes) over simple carbs (white bread, sugar) Space carbohydrates evenly throughout the day Pair carbs with protein and healthy fats to slow digestion Portion Control:  Use the plate method:  plate non-starchy vegetables,  plate protein,  plate carbs Be mindful of portion sizes, especially with restaurant meals What to Limit:  Sugary beverages (soda, juice, sweetened tea) Processed foods and snacks Excessive alcohol Physical Activity Guidelines: Aim for 150 minutes of moderate activity per week (30 minutes, 5 days a week) Include both cardio and strength training exercises Even short 10-minute activity sessions are beneficial Find activities you enjoy to make it sustainable Monitoring Your Progress: Have your A1C checked every 6 months Track your weight and waist circumference Be aware of symptoms like increased thirst, frequent urination, or unexplained fatigue     UNDERSTANDING IRON DEFICIENCY What is Iron Deficiency Anemia? Iron deficiency occurs when your body doesn't have enough iron to produce hemoglobin, the protein in red blood cells that carries oxygen throughout your body. Common Symptoms: Fatigue and weakness Pale skin Shortness of breath Restless legs syndrome Dizziness or lightheadedness Cold hands and feet Brittle nails or hair loss Iron-Rich Foods to Include in Your Diet: Animal Sources (Heme Iron) Plant  Sources (Non-Heme Iron)  Lean red meat Liver and organ meats Shellfish (clams, oysters) Sardines Poultry (especially dark meat) Beans and lentils Tofu and tempeh Spinach and kale Dried fruits (especially apricots) Fortified cereals  Tips to Improve Iron Absorption: Pair with Vitamin C: Combining iron-rich foods with vitamin C helps your body absorb iron better. Try adding citrus fruits, bell peppers, or tomatoes to iron-rich meals. Avoid Calcium with Iron: Calcium can interfere with iron absorption. Try not to take calcium supplements with iron-rich meals. Be Careful with Tea and Coffee: The tannins in these beverages can reduce iron absorption. Wait an hour before or after iron-rich meals. Cook in Cast Iron: Using cast iron cookware can add small amounts of iron to your food. When Iron Supplements Might Be Needed: If blood tests confirm low iron levels If dietary changes alone aren't sufficient During periods of increased iron need (pregnancy, heavy menstrual periods) Note: Only take iron supplements if recommended by your healthcare provider, as too much iron can be harmful.     HELPFUL RESOURCES: ?? Vision & IIH: Intracranial Hypertension Research Foundation - Investment banker, operational.ihrfoundation.org ?? Endometriosis: Endometriosis Foundation of [place] - www.endofound.org ?? Back Pain: American Physical Therapy Association - www.choosept.com ?? Prediabetes: Centers for Disease Control and Prevention - http://kim-miller.com/ ?? Anemia: Iron Disorders Institute - www.irondisorders.org

## 2023-11-21 ENCOUNTER — Encounter: Attending: Physical Medicine and Rehabilitation | Admitting: Physical Medicine and Rehabilitation

## 2023-11-21 VITALS — BP 108/67 | HR 69 | Ht 66.0 in | Wt 145.0 lb

## 2023-11-21 DIAGNOSIS — G8929 Other chronic pain: Secondary | ICD-10-CM | POA: Diagnosis not present

## 2023-11-21 DIAGNOSIS — M545 Low back pain, unspecified: Secondary | ICD-10-CM | POA: Insufficient documentation

## 2023-11-21 DIAGNOSIS — G932 Benign intracranial hypertension: Secondary | ICD-10-CM | POA: Diagnosis not present

## 2023-11-21 MED ORDER — LIDOCAINE-PRILOCAINE 2.5-2.5 % EX CREA: 1.0000 | TOPICAL_CREAM | CUTANEOUS | 0 refills | Status: AC | PRN

## 2023-11-21 NOTE — Progress Notes (Signed)
 Subjective:    Patient ID: Jacqueline Miranda, female    DOB: 2000-12-10, 23 y.o.   MRN: [id number]  HPI Mrs. Blalock is 23 year old woman who presents for follow-up low back pain.  1) Chronic low back pain -she notes this when she takes her medication for her high blood pressure, she says this is prescribed by neurology -pain is about the same -she is not taking any medications currently -started 2 years ago when she delivered her baby -went to gym and therapy and these did not help -she has tried tylenol  and this did not help -she has tried ibuprofen  and this did not help -has tried hot water and ice and these have helped -she has not tried tens unit  2) Neck pain -present since she delivered her baby  3) ICH: -she takes acetazolamide  -she follows with neurology  Pain Inventory Average Pain 7 Pain Right Now 2 My pain is sharp and aching  In the last 24 hours, has pain interfered with the following? General activity 5 Relation with others 7 Enjoyment of life 5 What TIME of day is your pain at its worst? morning , daytime, evening, and night Sleep (in general) Fair  Pain is worse with: walking, bending, sitting, inactivity, and standing Pain improves with: medication Relief from Meds: 6    Family History  Problem Relation Age of Onset   Hepatitis B Father    Asthma Mother    Social History   Socioeconomic History   Marital status: Single    Spouse name: Not on file   Number of children: 1   Years of education: Not on file   Highest education level: GED or equivalent  Occupational History   Not on file  Tobacco Use   Smoking status: Never   Smokeless tobacco: Never  Vaping Use   Vaping status: Never Used  Substance and Sexual Activity   Alcohol use: Never   Drug use: Never   Sexual activity: Not Currently  Other Topics Concern   Not on file  Social History Narrative   Father is Arob Scientist, physiological   Mother is [employer] Careers information officer       Refugee Information   Number  of Immediate Family Members: 8   Number of Immediate Family Members in [place] : 8   Date of Arrival: 02/12/20   Country of Birth: [place]   Other Country of Origin:: [place]   Location of Refugee [place]: Other   Other Location of Refugee Camp:: [place]   Duration in [place]: 11-15 years   Reason for Leaving Home Country: Religion   Primary Language: Other   Other Primary Language:: Dinka   Able to Read in Primary Language: No   Able to Write in Primary Language: No   Education: Primary School   Marital Status: Single   Tuberculosis Screening Overseas: Negative   Health Department Labs Completed: Yes   Do You Feel Jumpy or Nervous?: No   Are You Very Watchful or 'Super Alert'?: No   Social Drivers of Health   Financial Resource Strain: High Risk (02/17/2023)   Overall Financial Resource Strain (CARDIA)    Difficulty of Paying Living Expenses: Hard  Food Insecurity: No Food Insecurity (02/17/2023)   Hunger Vital Sign    Worried About Running Out of Food in the Last Year: Never true    Ran Out of Food in the Last Year: Never true  Transportation Needs: Unmet Transportation Needs (02/17/2023)   PRAPARE - Transportation  Lack of Transportation (Medical): No    Lack of Transportation (Non-Medical): Yes  Physical Activity: Insufficiently Active (02/17/2023)   Exercise Vital Sign    Days of Exercise per Week: 3 days    Minutes of Exercise per Session: 30 min  Stress: No Stress Concern Present (02/17/2023)   [employer] of Occupational Health - Occupational Stress Questionnaire    Feeling of Stress : Not at all  Social Connections: Socially Integrated (02/17/2023)   Social Connection and Isolation Panel    Frequency of Communication with Friends and Family: More than three times a week    Frequency of Social Gatherings with Friends and Family: Twice a week    Attends Religious Services: More than 4 times per year    Active Member of Clubs or Organizations: Yes    Attends Hospital doctor: More than 4 times per year    Marital Status: Living with partner   Past Surgical History:  Procedure Laterality Date   TONSILLECTOMY     Past Medical History:  Diagnosis Date   Anemia    Anemia in pregnancy 06/28/2020   Rx iron     Toothache 04/21/2020   BP 108/67   Pulse 69   Ht 5' 6 (1.676 m)   Wt 145 lb (65.8 kg)   SpO2 96%   BMI 23.40 kg/m   Opioid Risk Score:   Fall Risk Score:  `1  Depression screen [facility] 2/9     07/15/2023    9:37 AM 06/10/2023    9:42 AM 05/03/2023    1:53 PM 03/04/2023    9:51 AM 02/18/2023    8:57 AM 03/29/2022    1:46 PM 05/19/2021   10:03 AM  Depression screen PHQ 2/9  Decreased Interest 0 0 0 0 0 0 0  Down, Depressed, Hopeless 0 0 2 0 0 0 0  PHQ - 2 Score 0 0 2 0 0 0 0  Altered sleeping  1 2 0 0  3  Tired, decreased energy  1 3 1 2 2 1   Change in appetite  0 0 0 0 0 0  Feeling bad or failure about yourself   0 0 0 0 0 0  Trouble concentrating  0 0 0 0 0 0  Moving slowly or fidgety/restless  0 0 0 0 0 0  Suicidal thoughts  0 0 0 0 0 0  PHQ-9 Score  2 7 1 2  4   Difficult doing work/chores  Somewhat difficult  Somewhat difficult Not difficult at all       Review of Systems  Constitutional:  Positive for unexpected weight change.       Gained weight  HENT: Negative.    Eyes: Negative.   Respiratory:  Positive for shortness of breath.   Cardiovascular: Negative.   Gastrointestinal:  Positive for constipation.  Endocrine: Negative.   Genitourinary: Negative.   Musculoskeletal:  Positive for back pain and neck pain.  Skin: Negative.   Allergic/Immunologic: Negative.   Neurological:  Positive for dizziness and weakness.  Hematological: Negative.   Psychiatric/Behavioral:  Positive for dysphoric mood.   All other systems reviewed and are negative.      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 Discussed that this is a capsaicin patch, stronger than capsaicin cream. Discussed that it is currently approved for diabetic peripheral neuropathy and post-herpetic neuralgia, but that it has also shown benefit in treating other forms of neuropathy. Provided patient with link to site to learn more about the patch: https://www.clark.biz/. Discussed that the patch would be placed in office and benefits usually last 3 months. Discussed that unintended exposure to capsaicin can cause severe irritation of eyes, mucous membranes, respiratory tract, and skin, but that Qutenza is a local treatment and does not have the systemic side effects of other nerve medications. Discussed that there may be pain, itching, erythema, and decreased sensory function associated with the application of Qutenza. Side effects usually subside within 1 week. A cold pack of analgesic medications can help with these side effects. Blood pressure can also be increased due to pain associated with administration of the patch.   -continue heat therapy, discussed that this vasodilates the blood vessels and sends increased flow to the back.  -gave topical lidocaine  cream -d/c advil , robaxin , celebrex  since she does not want oral medications -discussed that she would like a cream for back -discussed that she is not taking any medications at this time -discussed that she has had Xrs that showed no pathology -Discussed  current symptoms of pain and history of pain.  -Discussed benefits of exercise in reducing pain. -Discussed following foods that may reduce pain: 1) Ginger (especially studied for arthritis)- reduce leukotriene production to decrease inflammation 2) Blueberries- high in phytonutrients that decrease inflammation 3) Salmon- marine omega-3s reduce joint swelling and pain 4) Pumpkin seeds- reduce inflammation 5) dark chocolate- reduces inflammation 6) turmeric- reduces inflammation 7) tart cherries - reduce pain and stiffness 8) extra virgin olive oil - its compound olecanthal helps to block prostaglandins  9) chili peppers- can be eaten or applied topically via capsaicin 10) mint- helpful for headache, muscle aches, joint pain, and itching 11) garlic- reduces inflammation  Link to further information on diet for chronic pain: http://www.bray.com/    2) Chronic neck pain -discussed XR -discussed that tens unit was too expensive for her  3) Intracranial hypertension -she has decreased acetazolamide  to 500mg  BID because it was causing her low back pain -discussed that she discussed above with Dr. Georjean who agreed with this decision.  -eye pain has been bothering her since she had to stop the acetazolamide  temporarily- it was stopped because she did not have any refills.  -she has an appointment with an eye doctor -messaged Dr. Georjean to see whether dose can be decreased further

## 2023-11-23 ENCOUNTER — Ambulatory Visit (HOSPITAL_BASED_OUTPATIENT_CLINIC_OR_DEPARTMENT_OTHER): Attending: Physical Medicine and Rehabilitation | Admitting: Physical Therapy

## 2023-11-23 DIAGNOSIS — G8929 Other chronic pain: Secondary | ICD-10-CM | POA: Insufficient documentation

## 2023-11-23 DIAGNOSIS — M545 Low back pain, unspecified: Secondary | ICD-10-CM | POA: Diagnosis present

## 2023-11-23 DIAGNOSIS — M5459 Other low back pain: Secondary | ICD-10-CM

## 2023-11-23 DIAGNOSIS — M546 Pain in thoracic spine: Secondary | ICD-10-CM | POA: Diagnosis not present

## 2023-11-23 NOTE — Therapy (Unsigned)
 OUTPATIENT PHYSICAL THERAPY THORACOLUMBAR EVALUATION   Patient Name: Jacqueline Miranda MRN: [id number] DOB:Jan 25, 2001, 23 y.o., female Today's Date: 11/24/2023  END OF SESSION:  PT End of Session - 11/24/23 0842     Visit Number 1    Date for PT Re-Evaluation 01/06/24    Authorization Type [place] UHC medicaid    PT Start Time 1705    PT Stop Time 1745    PT Time Calculation (min) 40 min    Activity Tolerance Patient tolerated treatment well    Behavior During Therapy [facility] for tasks assessed/performed          Past Medical History:  Diagnosis Date   Anemia    Anemia in pregnancy 06/28/2020   Rx iron     Toothache 04/21/2020   Past Surgical History:  Procedure Laterality Date   TONSILLECTOMY     Patient Active Problem List   Diagnosis Date Noted   Dysmenorrhea 10/14/2023   Dizziness 07/16/2023   Language barrier affecting health care 07/16/2023   IIH (idiopathic intracranial hypertension) 02/22/2023   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   Scoliosis 02/20/2023   Chronic neck pain 02/19/2023   Iron deficiency anemia 02/06/2023   Impaired fasting glucose 02/06/2023   Syncope 02/03/2023   Migraine 02/03/2023   Stomach pain 03/29/2022   Chronic back pain 05/19/2021   Acute reaction to situational stress 09/04/2020   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   Leg pain, bilateral 04/21/2020   Vision abnormalities 04/21/2020    PCP: Bernardino Cone MD  REFERRING PROVIDER:   Lorilee Sven SQUIBB, MD    REFERRING DIAG: M54.50,G89.29 (ICD-10-CM) - Chronic bilateral low back pain without sciatica   Rationale for Evaluation and Treatment: Rehabilitation  THERAPY DIAG:  Other low back pain  Pain in thoracic spine  ONSET DATE: chronic 2 + years  SUBJECTIVE:                                                                                                                                                                                           SUBJECTIVE STATEMENT: Pain started after  epidural for child birth ~ 71yrs ago. Has not had any relief since.  Gets worse everyday when she takes her medicine which is treating her glaucoma.  Land based PT didn't really help.  Wants to try aquatics  PERTINENT HISTORY:  Episode of land based therapy ~ 1 year ago  PAIN:  Are you having pain? Yes: NPRS scale: current 0-1/10; worst 8/10 Pain location: mid throracic area up towards shoulders Pain description: sharpe Aggravating factors: unknown unpredictable;glaucoma meds  Relieving factors: resting  PERTINENT HISTORY:  None  PAIN:  Are you having pain? Yes: NPRS scale: 8/10 Pain location: mid/lower back Pain description: shocking Aggravating factors: carrying,lifting, sitting Relieving factors: unknown   PRECAUTIONS: None   WEIGHT BEARING RESTRICTIONS: No   FALLS:  Has patient fallen in last 6 months? No   LIVING ENVIRONMENT: Lives with: lives with their family Lives in: House/apartment Stairs: No Has following equipment at home: None   OCCUPATION: student    PLOF: Independent  PATIENT GOALS: decrease pain  NEXT MD VISIT: next month  OBJECTIVE:  Note: Objective measures were completed at Evaluation unless otherwise noted.  DIAGNOSTIC FINDINGS:  MRI Lumbar spine IMPRESSION: 1. No findings to suggest discitis/osteomyelitis. 2. No significant stenosis.  PATIENT SURVEYS:  Modified Oswestry: 27/50  Interpretation of scores: Score Category Description  0-20% Minimal Disability The patient can cope with most living activities. Usually no treatment is indicated apart from advice on lifting, sitting and exercise  21-40% Moderate Disability The patient experiences more pain and difficulty with sitting, lifting and standing. Travel and social life are more difficult and they may be disabled from work. Personal care, sexual activity and sleeping are not grossly affected, and the patient can usually be managed by conservative means  41-60% Severe Disability Pain  remains the main problem in this group, but activities of daily living are affected. These patients require a detailed investigation  61-80% Crippled Back pain impinges on all aspects of the patient's life. Positive intervention is required  81-100% Bed-bound  These patients are either bed-bound or exaggerating their symptoms  Bluford FORBES [name], et al. Surgery versus conservative management of stable thoracolumbar fracture: the PRESTO feasibility RCT. [place]): [employer]; 2021 Nov. [facility] Technology Assessment, No. 25.62.) Appendix 3, Oswestry Disability Index category descriptors. Available from: FindJewel[employer].cz  Minimally Clinically Important Difference (MCID) = 12.8%  COGNITION: Overall cognitive status: Within functional limits for tasks assessed     SENSATION: WFL  MUSCLE LENGTH: Hamstrings: WNL Thomas test: (+) Hip flexor left  POSTURE: anterior pelvic tilt  PALPATION: TTP bilateral thoracolumbar paraspinals  LUMBAR ROM:   Full.  P! with extension  LOWER EXTREMITY ROM:     wfl  LOWER EXTREMITY STRENGTH:    HD (Lbs) Right eval Left eval  Hip flexion 61.2 67.7  Hip extension    Hip abduction 28.5 33.0  Hip adduction    Hip internal rotation    Hip external rotation    Knee flexion    Knee extension    Ankle dorsiflexion    Ankle plantarflexion    Ankle inversion    Ankle eversion     (Blank rows = not tested)  LUMBAR SPECIAL TESTS:  Straight leg raise test: Negative, Slump test: Negative, and SI Compression/distraction test: Negative  FUNCTIONAL TESTS:  5 times sit to stand: 17.37 4 stage balance: Passed x 4  GAIT: Distance walked: 400 Assistive device utilized: None Level of assistance: Complete Independence Comments: wfl  TREATMENT  Eval Self care:Posture and Optometrist handout and instruction.  Lifting 23yo  PATIENT EDUCATION:  Education details: Discussed eval findings, rehab rationale, aquatic program progression/POC and pools in area. Patient is in agreement  Person educated: Patient Education method: Chief Technology Officer Education comprehension: verbalized understanding  HOME EXERCISE PROGRAM: TBA for aquatics [id number] prior land based episode  ASSESSMENT:  CLINICAL IMPRESSION: Patient is a 23 y.o. f who was seen today for physical therapy evaluation and treatment for LBP. She presents today with translator although with fairly good english skills.  She was seen by Pt x ~ 1year ago for same issue, returning today with reports of no improvements in pain symptoms with or after land based therapy with orders to trial aquatics.   She reports pain began after having epidural with birth of 1 yo child and is exacerbated by a medication she takes daily for glaucoma.  Lumbar MRI is neg.  She has moderate pain sensitivity throughout paraspinals in thoracolumbar spine.  Lumbar and thoracic movement wfl although with some pain at end range with extension. Strength in hips wfl (improved since eval last year) and no evidence of balance deficits.She continues to have anterior pelvic tilt posture.  Pt will benefit from a trial of aquatic PT to improve core strength and manage pain.    OBJECTIVE IMPAIRMENTS: postural dysfunction and pain.   ACTIVITY LIMITATIONS: carrying, lifting, bending, sitting, standing, squatting, and locomotion level   PARTICIPATION LIMITATIONS: meal prep, cleaning, laundry, shopping, and school   PERSONAL FACTORS: Age, Fitness, Profession, and Time since onset of injury/illness/exacerbation are also affecting patient's functional outcome.    REHAB POTENTIAL: Good   CLINICAL DECISION MAKING: Stable/uncomplicated   EVALUATION COMPLEXITY: Low   GOALS: Goals reviewed with patient? Yes  SHORT TERM GOALS: Target date:  01/06/24  Pt will tolerate full aquatic sessions consistently without increase in pain and with improving function to demonstrate good toleration and effectiveness of intervention.  Baseline: Goal status: INITIAL  2.  Pt to improve on ODI by 12.8% (MCID) to demonstrate statistically significant Improvement in function. Baseline: 27/50=54% Goal status: INITIAL  3.  Pt will report decrease in pain by at least 50% for improved toleration to activity/quality of life and to demonstrate improved management of pain. Baseline:  Goal status: INITIAL  4.  Pt will tolerate lumbar extension without Pain Baseline:  Goal status: INITIAL   LONG TERM GOALS: To be set at re-cert if approp   PLAN:  PT FREQUENCY: 1-2x/week  PT DURATION: 6 weeks  PLANNED INTERVENTIONS: 97164- PT Re-evaluation, 97750- Physical Performance Testing, 97110-Therapeutic exercises, 97530- Therapeutic activity, 97112- Neuromuscular re-education, 97535- Self Care, 02859- Manual therapy, [id number]- Gait training, [phone]- Aquatic Therapy, [phone]- Ionotophoresis 4mg /ml Dexamethasone, 79439 (1-2 muscles), 20561 (3+ muscles)- Dry Needling, Patient/Family education, Balance training, Stair training, Taping, Joint mobilization, DME instructions, Cryotherapy, and Moist heat.  PLAN FOR NEXT SESSION: Trial aquatics for pain management of chronic back pain thoracic through lumbar   Ronal Kem) Maryagnes Carrasco MPT 11/24/23 1:09 PM [facility] [address] [place], [place], [address] Phone: [phone]   Fax:  [phone]   For all possible CPT codes, reference the Planned Interventions line above.     Check all conditions that are expected to impact treatment: {Conditions expected to impact treatment:Musculoskeletal disorders   If treatment provided at initial evaluation, no treatment charged due to lack of authorization.

## 2023-11-24 ENCOUNTER — Encounter (HOSPITAL_BASED_OUTPATIENT_CLINIC_OR_DEPARTMENT_OTHER): Payer: Self-pay | Admitting: Physical Therapy

## 2023-11-24 ENCOUNTER — Other Ambulatory Visit: Payer: Self-pay

## 2023-11-28 ENCOUNTER — Ambulatory Visit (HOSPITAL_BASED_OUTPATIENT_CLINIC_OR_DEPARTMENT_OTHER): Admitting: Physical Therapy

## 2023-11-28 ENCOUNTER — Encounter (HOSPITAL_BASED_OUTPATIENT_CLINIC_OR_DEPARTMENT_OTHER): Payer: Self-pay | Admitting: Physical Therapy

## 2023-11-28 DIAGNOSIS — M545 Low back pain, unspecified: Secondary | ICD-10-CM | POA: Diagnosis not present

## 2023-11-28 DIAGNOSIS — M6281 Muscle weakness (generalized): Secondary | ICD-10-CM

## 2023-11-28 DIAGNOSIS — M546 Pain in thoracic spine: Secondary | ICD-10-CM

## 2023-11-28 DIAGNOSIS — M5459 Other low back pain: Secondary | ICD-10-CM

## 2023-11-28 NOTE — Therapy (Signed)
 OUTPATIENT PHYSICAL THERAPY THORACOLUMBAR TREATMENT   Patient Name: Jacqueline Miranda MRN: [id number] DOB:May 06, 2001, 23 y.o., female Today's Date: 11/28/2023  END OF SESSION:  PT End of Session - 11/28/23 1540     Visit Number 2    Date for PT Re-Evaluation 01/06/24    Authorization Type [place] UHC medicaid    PT Start Time 1531    PT Stop Time 1615    PT Time Calculation (min) 44 min    Activity Tolerance Patient tolerated treatment well    Behavior During Therapy [facility] for tasks assessed/performed          Past Medical History:  Diagnosis Date   Anemia    Anemia in pregnancy 06/28/2020   Rx iron     Toothache 04/21/2020   Past Surgical History:  Procedure Laterality Date   TONSILLECTOMY     Patient Active Problem List   Diagnosis Date Noted   Dysmenorrhea 10/14/2023   Dizziness 07/16/2023   Language barrier affecting health care 07/16/2023   IIH (idiopathic intracranial hypertension) 02/22/2023   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   Scoliosis 02/20/2023   Chronic neck pain 02/19/2023   Iron deficiency anemia 02/06/2023   Impaired fasting glucose 02/06/2023   Syncope 02/03/2023   Migraine 02/03/2023   Stomach pain 03/29/2022   Chronic back pain 05/19/2021   Acute reaction to situational stress 09/04/2020   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   Leg pain, bilateral 04/21/2020   Vision abnormalities 04/21/2020    PCP: Bernardino Cone MD  REFERRING PROVIDER:   Lorilee Sven SQUIBB, MD    REFERRING DIAG: M54.50,G89.29 (ICD-10-CM) - Chronic bilateral low back pain without sciatica   Rationale for Evaluation and Treatment: Rehabilitation  THERAPY DIAG:  Other low back pain  Pain in thoracic spine  Muscle weakness (generalized)  ONSET DATE: chronic 2 + years  SUBJECTIVE:                                                                                                                                                                                           SUBJECTIVE  STATEMENT: Pt presents with interpreter.  Reports no pain at initiation of session but had been 7/10 prior to taking pain meds earlier today.   Initial Subjective Pain started after epidural for child birth ~ 89yrs ago. Has not had any relief since.  Gets worse everyday when she takes her medicine which is treating her glaucoma.  Land based PT didn't really help.  Wants to try aquatics  PERTINENT HISTORY:  Episode of land based therapy ~ 1 year ago  PAIN:  Are you having pain? Yes: NPRS scale: current  0-1/10; worst 8/10 Pain location: mid throracic area up towards shoulders Pain description: sharpe Aggravating factors: unknown unpredictable;glaucoma meds  Relieving factors: resting  PERTINENT HISTORY:  None    PAIN:  Are you having pain? Yes: NPRS scale: 8/10 Pain location: mid/lower back Pain description: shocking Aggravating factors: carrying,lifting, sitting Relieving factors: unknown   PRECAUTIONS: None   WEIGHT BEARING RESTRICTIONS: No   FALLS:  Has patient fallen in last 6 months? No   LIVING ENVIRONMENT: Lives with: lives with their family Lives in: House/apartment Stairs: No Has following equipment at home: None   OCCUPATION: student    PLOF: Independent  PATIENT GOALS: decrease pain  NEXT MD VISIT: next month  OBJECTIVE:  Note: Objective measures were completed at Evaluation unless otherwise noted.  DIAGNOSTIC FINDINGS:  MRI Lumbar spine IMPRESSION: 1. No findings to suggest discitis/osteomyelitis. 2. No significant stenosis.  PATIENT SURVEYS:  Modified Oswestry: 27/50  Interpretation of scores: Score Category Description  0-20% Minimal Disability The patient can cope with most living activities. Usually no treatment is indicated apart from advice on lifting, sitting and exercise  21-40% Moderate Disability The patient experiences more pain and difficulty with sitting, lifting and standing. Travel and social life are more difficult and they may  be disabled from work. Personal care, sexual activity and sleeping are not grossly affected, and the patient can usually be managed by conservative means  41-60% Severe Disability Pain remains the main problem in this group, but activities of daily living are affected. These patients require a detailed investigation  61-80% Crippled Back pain impinges on all aspects of the patient's life. Positive intervention is required  81-100% Bed-bound  These patients are either bed-bound or exaggerating their symptoms  Bluford FORBES [name], et al. Surgery versus conservative management of stable thoracolumbar fracture: the PRESTO feasibility RCT. [place] ([place]): [employer]; 2021 Nov. [facility] Technology Assessment, No. 25.62.) Appendix 3, Oswestry Disability Index category descriptors. Available from: FindJewelers.cz  Minimally Clinically Important Difference (MCID) = 12.8%  COGNITION: Overall cognitive status: Within functional limits for tasks assessed     SENSATION: WFL  MUSCLE LENGTH: Hamstrings: WNL Thomas test: (+) Hip flexor left  POSTURE: anterior pelvic tilt  PALPATION: TTP bilateral thoracolumbar paraspinals  LUMBAR ROM:   Full.  P! with extension  LOWER EXTREMITY ROM:     wfl  LOWER EXTREMITY STRENGTH:    HD (Lbs) Right eval Left eval  Hip flexion 61.2 67.7  Hip extension    Hip abduction 28.5 33.0  Hip adduction    Hip internal rotation    Hip external rotation    Knee flexion    Knee extension    Ankle dorsiflexion    Ankle plantarflexion    Ankle inversion    Ankle eversion     (Blank rows = not tested)  LUMBAR SPECIAL TESTS:  Straight leg raise test: Negative, Slump test: Negative, and SI Compression/distraction test: Negative  FUNCTIONAL TESTS:  5 times sit to stand: 17.37 4 stage balance: Passed x 4  GAIT: Distance walked: 400 Assistive device utilized: None Level of assistance: Complete  Independence Comments: wfl  TREATMENT  [facility] Adult PT Treatment:                                                DATE: 11/28/23 Pt seen for aquatic therapy today.  Treatment  took place in water 3.5-4.75 ft in depth at the [facility] pool. Temp of water was 91.  Pt entered/exited the pool via stairs using alternating pattern with hand  rail.  *Intro to setting *Ue support on wall: toe raises; heel raises *walking forward and back in 3.6-4.3 ft with ue support of barbell *L stretch *seated on lift: cycling; hip add/abd; hip flex/ext *standing: RBHB ue horizontal add/abd in staggered stances *TrA engagement 1/2 noodle->full noodle pull down wide stance then staggered stances x 8-10 *side stepping using RBHB ua add/abd x 4 widths    Pt requires the buoyancy and hydrostatic pressure of water for support, and to offload joints by unweighting joint load by at least 50 % in navel deep water and by at least 75-80% in chest to neck deep water.  Viscosity of the water is needed for resistance of strengthening. Water current perturbations provides challenge to standing balance requiring increased core activation.                                                                                                                                  PATIENT EDUCATION:  Education details: Discussed eval findings, rehab rationale, aquatic program progression/POC and pools in area. Patient is in agreement  Person educated: Patient Education method: Chief Technology Officer Education comprehension: verbalized understanding  HOME EXERCISE PROGRAM: TBA for aquatics [id number] prior land based episode  ASSESSMENT:  CLINICAL IMPRESSION: Pt demonstrates safety and independence in aquatic setting with therapist instructing from deck. She is confident in setting, moving throughout all depths easily be end of session.  Pt is directed through various movement patterns and trials in both sitting and standing  positions. She does report no back pain throughout except when completing the L stretch which recreated her pain.  It did cease upon discontinuing of activity. She does tolerate session well overall.   There is some difficulty with communication despite translator.   Goals are ongoing.     Initial Impression Patient is a 23 y.o. f who was seen today for physical therapy evaluation and treatment for LBP. She presents today with translator although with fairly good english skills.  She was seen by Pt x ~ 1year ago for same issue, returning today with reports of no improvements in pain symptoms with or after land based therapy with orders to trial aquatics.   She reports pain began after having epidural with birth of 1 yo child and is exacerbated by a medication she takes daily for glaucoma.  Lumbar MRI is neg.  She has moderate pain sensitivity throughout paraspinals in thoracolumbar spine.  Lumbar and thoracic movement wfl although with some pain at end range with extension. Strength in hips wfl (improved since eval last year) and no evidence of balance deficits.She continues to have anterior pelvic tilt posture.  Pt will benefit from a trial of aquatic PT to improve core strength and manage pain.  OBJECTIVE IMPAIRMENTS: postural dysfunction and pain.   ACTIVITY LIMITATIONS: carrying, lifting, bending, sitting, standing, squatting, and locomotion level   PARTICIPATION LIMITATIONS: meal prep, cleaning, laundry, shopping, and school   PERSONAL FACTORS: Age, Fitness, Profession, and Time since onset of injury/illness/exacerbation are also affecting patient's functional outcome.    REHAB POTENTIAL: Good   CLINICAL DECISION MAKING: Stable/uncomplicated   EVALUATION COMPLEXITY: Low   GOALS: Goals reviewed with patient? Yes  SHORT TERM GOALS: Target date: 01/06/24  Pt will tolerate full aquatic sessions consistently without increase in pain and with improving function to demonstrate good  toleration and effectiveness of intervention.  Baseline: Goal status: INITIAL  2.  Pt to improve on ODI by 12.8% (MCID) to demonstrate statistically significant Improvement in function. Baseline: 27/50=54% Goal status: INITIAL  3.  Pt will report decrease in pain by at least 50% for improved toleration to activity/quality of life and to demonstrate improved management of pain. Baseline:  Goal status: INITIAL  4.  Pt will tolerate lumbar extension without Pain Baseline:  Goal status: INITIAL   LONG TERM GOALS: To be set at re-cert if approp   PLAN:  PT FREQUENCY: 1-2x/week  PT DURATION: 6 weeks  PLANNED INTERVENTIONS: 97164- PT Re-evaluation, 97750- Physical Performance Testing, 97110-Therapeutic exercises, 97530- Therapeutic activity, 97112- Neuromuscular re-education, 97535- Self Care, 02859- Manual therapy, [id number]- Gait training, [phone]- Aquatic Therapy, [phone]- Ionotophoresis 4mg /ml Dexamethasone, 79439 (1-2 muscles), 20561 (3+ muscles)- Dry Needling, Patient/Family education, Balance training, Stair training, Taping, Joint mobilization, DME instructions, Cryotherapy, and Moist heat.  PLAN FOR NEXT SESSION: Trial aquatics for pain management of chronic back pain thoracic through lumbar   Ronal Foots) Jaylynn Siefert MPT 11/28/23 4:14 PM [facility] [address] [place], [place], [address] Phone: [phone]   Fax:  [phone]   For all possible CPT codes, reference the Planned Interventions line above.     Check all conditions that are expected to impact treatment: {Conditions expected to impact treatment:Musculoskeletal disorders   If treatment provided at initial evaluation, no treatment charged due to lack of authorization.

## 2023-12-08 ENCOUNTER — Ambulatory Visit (HOSPITAL_BASED_OUTPATIENT_CLINIC_OR_DEPARTMENT_OTHER): Attending: Physical Medicine and Rehabilitation | Admitting: Physical Therapy

## 2023-12-08 ENCOUNTER — Encounter (HOSPITAL_BASED_OUTPATIENT_CLINIC_OR_DEPARTMENT_OTHER): Payer: Self-pay | Admitting: Physical Therapy

## 2023-12-08 DIAGNOSIS — M546 Pain in thoracic spine: Secondary | ICD-10-CM | POA: Insufficient documentation

## 2023-12-08 DIAGNOSIS — M6281 Muscle weakness (generalized): Secondary | ICD-10-CM | POA: Diagnosis present

## 2023-12-08 DIAGNOSIS — M5459 Other low back pain: Secondary | ICD-10-CM | POA: Diagnosis present

## 2023-12-08 DIAGNOSIS — R293 Abnormal posture: Secondary | ICD-10-CM | POA: Diagnosis present

## 2023-12-08 NOTE — Therapy (Signed)
 OUTPATIENT PHYSICAL THERAPY THORACOLUMBAR TREATMENT   Patient Name: Jacqueline Miranda MRN: [id number] DOB:May 08, 2001, 23 y.o., female Today's Date: 12/08/2023  END OF SESSION:  PT End of Session - 12/08/23 1535     Visit Number 3    Date for PT Re-Evaluation 01/06/24    Authorization Type [place] [employer] medicaid    PT Start Time 1533    PT Stop Time 1612    PT Time Calculation (min) 39 min    Activity Tolerance Patient tolerated treatment well    Behavior During Therapy WFL for tasks assessed/performed          Past Medical History:  Diagnosis Date   Anemia    Anemia in pregnancy 06/28/2020   Rx iron     Toothache 04/21/2020   Past Surgical History:  Procedure Laterality Date   TONSILLECTOMY     Patient Active Problem List   Diagnosis Date Noted   Dysmenorrhea 10/14/2023   Dizziness 07/16/2023   Language barrier affecting health care 07/16/2023   IIH (idiopathic intracranial hypertension) 02/22/2023   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   Scoliosis 02/20/2023   Chronic neck pain 02/19/2023   Iron deficiency anemia 02/06/2023   Impaired fasting glucose 02/06/2023   Syncope 02/03/2023   Migraine 02/03/2023   Stomach pain 03/29/2022   Chronic back pain 05/19/2021   Acute reaction to situational stress 09/04/2020   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   Leg pain, bilateral 04/21/2020   Vision abnormalities 04/21/2020    PCP: Bernardino Cone MD  REFERRING PROVIDER:   Lorilee Sven SQUIBB, MD    REFERRING DIAG: M54.50,G89.29 (ICD-10-CM) - Chronic bilateral low back pain without sciatica   Rationale for Evaluation and Treatment: Rehabilitation  THERAPY DIAG:  Other low back pain  Pain in thoracic spine  Muscle weakness (generalized)  ONSET DATE: chronic 2 + years  SUBJECTIVE:                                                                                                                                                                                           SUBJECTIVE  STATEMENT: Pt presents with interpreter.  Good response from first session reports no pain that evening when she took the medicine.   Initial Subjective Pain started after epidural for child birth ~ 64yrs ago. Has not had any relief since.  Gets worse everyday when she takes her medicine which is treating her glaucoma.  Land based PT didn't really help.  Wants to try aquatics  PERTINENT HISTORY:  Episode of land based therapy ~ 1 year ago  PAIN:  Are you having pain? Yes: NPRS scale: current 0-1/10; worst 8/10  Pain location: mid throracic area up towards shoulders Pain description: sharpe Aggravating factors: unknown unpredictable;glaucoma meds  Relieving factors: resting  PERTINENT HISTORY:  None    PAIN:  Are you having pain? Yes: NPRS scale: 8/10 Pain location: mid/lower back Pain description: shocking Aggravating factors: carrying,lifting, sitting Relieving factors: unknown   PRECAUTIONS: None   WEIGHT BEARING RESTRICTIONS: No   FALLS:  Has patient fallen in last 6 months? No   LIVING ENVIRONMENT: Lives with: lives with their family Lives in: House/apartment Stairs: No Has following equipment at home: None   OCCUPATION: student    PLOF: Independent  PATIENT GOALS: decrease pain  NEXT MD VISIT: next month  OBJECTIVE:  Note: Objective measures were completed at Evaluation unless otherwise noted.  DIAGNOSTIC FINDINGS:  MRI Lumbar spine IMPRESSION: 1. No findings to suggest discitis/osteomyelitis. 2. No significant stenosis.  PATIENT SURVEYS:  Modified Oswestry: 27/50  Interpretation of scores: Score Category Description  0-20% Minimal Disability The patient can cope with most living activities. Usually no treatment is indicated apart from advice on lifting, sitting and exercise  21-40% Moderate Disability The patient experiences more pain and difficulty with sitting, lifting and standing. Travel and social life are more difficult and they may be disabled  from work. Personal care, sexual activity and sleeping are not grossly affected, and the patient can usually be managed by conservative means  41-60% Severe Disability Pain remains the main problem in this group, but activities of daily living are affected. These patients require a detailed investigation  61-80% Crippled Back pain impinges on all aspects of the patient's life. Positive intervention is required  81-100% Bed-bound  These patients are either bed-bound or exaggerating their symptoms  Bluford FORBES [name], et al. Surgery versus conservative management of stable thoracolumbar fracture: the PRESTO feasibility RCT. [place] ([place]): [employer]; 2021 Nov. [facility] Technology Assessment, No. 25.62.) Appendix 3, Oswestry Disability Index category descriptors. Available from: FindJewelers.cz  Minimally Clinically Important Difference (MCID) = 12.8%  COGNITION: Overall cognitive status: Within functional limits for tasks assessed     SENSATION: WFL  MUSCLE LENGTH: Hamstrings: WNL Thomas test: (+) Hip flexor left  POSTURE: anterior pelvic tilt  PALPATION: TTP bilateral thoracolumbar paraspinals  LUMBAR ROM:   Full.  P! with extension  LOWER EXTREMITY ROM:     wfl  LOWER EXTREMITY STRENGTH:    HD (Lbs) Right eval Left eval  Hip flexion 61.2 67.7  Hip extension    Hip abduction 28.5 33.0  Hip adduction    Hip internal rotation    Hip external rotation    Knee flexion    Knee extension    Ankle dorsiflexion    Ankle plantarflexion    Ankle inversion    Ankle eversion     (Blank rows = not tested)  LUMBAR SPECIAL TESTS:  Straight leg raise test: Negative, Slump test: Negative, and SI Compression/distraction test: Negative  FUNCTIONAL TESTS:  5 times sit to stand: 17.37 4 stage balance: Passed x 4  GAIT: Distance walked: 400 Assistive device utilized: None Level of assistance: Complete  Independence Comments: wfl  TREATMENT  [facility] Adult PT Treatment:                                                DATE: 11/28/23 Pt seen for aquatic therapy today.  Treatment took place in  water 3.5-4.75 ft in depth at the [facility] pool. Temp of water was 91.  Pt entered/exited the pool via stairs using alternating pattern with hand  rail.  *walking forward and back in 3.6-4.3 ft with unsupported *L stretch *side stepping using RBHB ua add/abd x 4 widths *standing: RBHB ue horizontal add/abd in staggered stances *bow & arrow *wall push up *TrA engagement full noodle pull down wide stance then staggered stances x 10 *seated swing like on solid noodle: PPT *seated on bench: cycling; hip add/abd (pt reports discomfort in hips with full abd, reduced with cues to decrease range)  Pt requires the buoyancy and hydrostatic pressure of water for support, and to offload joints by unweighting joint load by at least 50 % in navel deep water and by at least 75-80% in chest to neck deep water.  Viscosity of the water is needed for resistance of strengthening. Water current perturbations provides challenge to standing balance requiring increased core activation.                                                                                                                                  PATIENT EDUCATION:  Education details: Discussed eval findings, rehab rationale, aquatic program progression/POC and pools in area. Patient is in agreement  Person educated: Patient Education method: Chief Technology Officer Education comprehension: verbalized understanding  HOME EXERCISE PROGRAM: TBA for aquatics [id number] prior land based episode  ASSESSMENT:  CLINICAL IMPRESSION: Advance strengthening and stretching with good toleration.  Pt with improving pain response to first session.  Demonstrates decreased guarding in setting allowing for increased range with movement. Goals  ongoing     Initial Impression Patient is a 23 y.o. f who was seen today for physical therapy evaluation and treatment for LBP. She presents today with translator although with fairly good english skills.  She was seen by Pt x ~ 1year ago for same issue, returning today with reports of no improvements in pain symptoms with or after land based therapy with orders to trial aquatics.   She reports pain began after having epidural with birth of 1 yo child and is exacerbated by a medication she takes daily for glaucoma.  Lumbar MRI is neg.  She has moderate pain sensitivity throughout paraspinals in thoracolumbar spine.  Lumbar and thoracic movement wfl although with some pain at end range with extension. Strength in hips wfl (improved since eval last year) and no evidence of balance deficits.She continues to have anterior pelvic tilt posture.  Pt will benefit from a trial of aquatic PT to improve core strength and manage pain.    OBJECTIVE IMPAIRMENTS: postural dysfunction and pain.   ACTIVITY LIMITATIONS: carrying, lifting, bending, sitting, standing, squatting, and locomotion level   PARTICIPATION LIMITATIONS: meal prep, cleaning, laundry, shopping, and school   PERSONAL FACTORS: Age, Fitness, Profession, and Time since onset of injury/illness/exacerbation are also affecting patient's functional outcome.    REHAB POTENTIAL:  Good   CLINICAL DECISION MAKING: Stable/uncomplicated   EVALUATION COMPLEXITY: Low   GOALS: Goals reviewed with patient? Yes  SHORT TERM GOALS: Target date: 01/06/24  Pt will tolerate full aquatic sessions consistently without increase in pain and with improving function to demonstrate good toleration and effectiveness of intervention.  Baseline: Goal status: INITIAL  2.  Pt to improve on ODI by 12.8% (MCID) to demonstrate statistically significant Improvement in function. Baseline: 27/50=54% Goal status: INITIAL  3.  Pt will report decrease in pain by at least  50% for improved toleration to activity/quality of life and to demonstrate improved management of pain. Baseline:  Goal status: INITIAL  4.  Pt will tolerate lumbar extension without Pain Baseline:  Goal status: INITIAL   LONG TERM GOALS: To be set at re-cert if approp   PLAN:  PT FREQUENCY: 1-2x/week  PT DURATION: 6 weeks  PLANNED INTERVENTIONS: 97164- PT Re-evaluation, 97750- Physical Performance Testing, 97110-Therapeutic exercises, 97530- Therapeutic activity, 97112- Neuromuscular re-education, 97535- Self Care, 02859- Manual therapy, [id number]- Gait training, [phone]- Aquatic Therapy, [phone]- Ionotophoresis 4mg /ml Dexamethasone, 79439 (1-2 muscles), 20561 (3+ muscles)- Dry Needling, Patient/Family education, Balance training, Stair training, Taping, Joint mobilization, DME instructions, Cryotherapy, and Moist heat.  PLAN FOR NEXT SESSION: Trial aquatics for pain management of chronic back pain thoracic through lumbar   Ronal Foots) Jayliana Valencia MPT 12/08/23 3:39 PM [facility] [address] [place], [place], [address] Phone: [phone]   Fax:  [phone]   For all possible CPT codes, reference the Planned Interventions line above.     Check all conditions that are expected to impact treatment: {Conditions expected to impact treatment:Musculoskeletal disorders   If treatment provided at initial evaluation, no treatment charged due to lack of authorization.

## 2023-12-15 ENCOUNTER — Ambulatory Visit (HOSPITAL_BASED_OUTPATIENT_CLINIC_OR_DEPARTMENT_OTHER): Admitting: Physical Therapy

## 2023-12-15 ENCOUNTER — Encounter (HOSPITAL_BASED_OUTPATIENT_CLINIC_OR_DEPARTMENT_OTHER): Payer: Self-pay | Admitting: Physical Therapy

## 2023-12-15 DIAGNOSIS — M5459 Other low back pain: Secondary | ICD-10-CM

## 2023-12-15 DIAGNOSIS — M6281 Muscle weakness (generalized): Secondary | ICD-10-CM

## 2023-12-15 DIAGNOSIS — R293 Abnormal posture: Secondary | ICD-10-CM

## 2023-12-15 DIAGNOSIS — M546 Pain in thoracic spine: Secondary | ICD-10-CM

## 2023-12-15 NOTE — Therapy (Signed)
 OUTPATIENT PHYSICAL THERAPY THORACOLUMBAR TREATMENT   Patient Name: Jacqueline Miranda MRN: [id number] DOB:03/05/2001, 23 y.o., female Today's Date: 12/15/2023  END OF SESSION:  PT End of Session - 12/15/23 1530     Visit Number 4    Date for PT Re-Evaluation 01/06/24    Authorization Type [place] [employer] medicaid    PT Start Time 1531    PT Stop Time 1610    PT Time Calculation (min) 39 min    Activity Tolerance Patient tolerated treatment well    Behavior During Therapy WFL for tasks assessed/performed          Past Medical History:  Diagnosis Date   Anemia    Anemia in pregnancy 06/28/2020   Rx iron     Toothache 04/21/2020   Past Surgical History:  Procedure Laterality Date   TONSILLECTOMY     Patient Active Problem List   Diagnosis Date Noted   Dysmenorrhea 10/14/2023   Dizziness 07/16/2023   Language barrier affecting health care 07/16/2023   IIH (idiopathic intracranial hypertension) 02/22/2023   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   Scoliosis 02/20/2023   Chronic neck pain 02/19/2023   Iron deficiency anemia 02/06/2023   Impaired fasting glucose 02/06/2023   Syncope 02/03/2023   Migraine 02/03/2023   Stomach pain 03/29/2022   Chronic back pain 05/19/2021   Acute reaction to situational stress 09/04/2020   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   Leg pain, bilateral 04/21/2020   Vision abnormalities 04/21/2020    PCP: Bernardino Cone MD  REFERRING PROVIDER:   Lorilee Sven SQUIBB, MD    REFERRING DIAG: M54.50,G89.29 (ICD-10-CM) - Chronic bilateral low back pain without sciatica   Rationale for Evaluation and Treatment: Rehabilitation  THERAPY DIAG:  Other low back pain  Pain in thoracic spine  Muscle weakness (generalized)  Abnormal posture  ONSET DATE: chronic 2 + years  SUBJECTIVE:                                                                                                                                                                                            SUBJECTIVE STATEMENT: Pt presents with interpreter.  Good response from last session with 2 days of pain relief after meds.   Initial Subjective Pain started after epidural for child birth ~ 5yrs ago. Has not had any relief since.  Gets worse everyday when she takes her medicine which is treating her glaucoma.  Land based PT didn't really help.  Wants to try aquatics  PERTINENT HISTORY:  Episode of land based therapy ~ 1 year ago  PAIN:  Are you having pain? Yes: NPRS scale: current 0-1/10; worst  8/10 Pain location: mid throracic area up towards shoulders Pain description: sharpe Aggravating factors: unknown unpredictable;glaucoma meds  Relieving factors: resting  PERTINENT HISTORY:  None    PAIN:  Are you having pain? Yes: NPRS scale: 8/10 Pain location: mid/lower back Pain description: shocking Aggravating factors: carrying,lifting, sitting Relieving factors: unknown   PRECAUTIONS: None   WEIGHT BEARING RESTRICTIONS: No   FALLS:  Has patient fallen in last 6 months? No   LIVING ENVIRONMENT: Lives with: lives with their family Lives in: House/apartment Stairs: No Has following equipment at home: None   OCCUPATION: student    PLOF: Independent  PATIENT GOALS: decrease pain  NEXT MD VISIT: next month  OBJECTIVE:  Note: Objective measures were completed at Evaluation unless otherwise noted.  DIAGNOSTIC FINDINGS:  MRI Lumbar spine IMPRESSION: 1. No findings to suggest discitis/osteomyelitis. 2. No significant stenosis.  PATIENT SURVEYS:  Modified Oswestry: 27/50  Interpretation of scores: Score Category Description  0-20% Minimal Disability The patient can cope with most living activities. Usually no treatment is indicated apart from advice on lifting, sitting and exercise  21-40% Moderate Disability The patient experiences more pain and difficulty with sitting, lifting and standing. Travel and social life are more difficult and they may be disabled  from work. Personal care, sexual activity and sleeping are not grossly affected, and the patient can usually be managed by conservative means  41-60% Severe Disability Pain remains the main problem in this group, but activities of daily living are affected. These patients require a detailed investigation  61-80% Crippled Back pain impinges on all aspects of the patient's life. Positive intervention is required  81-100% Bed-bound  These patients are either bed-bound or exaggerating their symptoms  Bluford FORBES [name], et al. Surgery versus conservative management of stable thoracolumbar fracture: the PRESTO feasibility RCT. [place] ([place]): [employer]; 2021 Nov. [facility] Technology Assessment, No. 25.62.) Appendix 3, Oswestry Disability Index category descriptors. Available from: FindJewelers.cz  Minimally Clinically Important Difference (MCID) = 12.8%  COGNITION: Overall cognitive status: Within functional limits for tasks assessed     SENSATION: WFL  MUSCLE LENGTH: Hamstrings: WNL Thomas test: (+) Hip flexor left  POSTURE: anterior pelvic tilt  PALPATION: TTP bilateral thoracolumbar paraspinals  LUMBAR ROM:   Full.  P! with extension  LOWER EXTREMITY ROM:     wfl  LOWER EXTREMITY STRENGTH:    HD (Lbs) Right eval Left eval  Hip flexion 61.2 67.7  Hip extension    Hip abduction 28.5 33.0  Hip adduction    Hip internal rotation    Hip external rotation    Knee flexion    Knee extension    Ankle dorsiflexion    Ankle plantarflexion    Ankle inversion    Ankle eversion     (Blank rows = not tested)  LUMBAR SPECIAL TESTS:  Straight leg raise test: Negative, Slump test: Negative, and SI Compression/distraction test: Negative  FUNCTIONAL TESTS:  5 times sit to stand: 17.37 4 stage balance: Passed x 4  GAIT: Distance walked: 400 Assistive device utilized: None Level of assistance: Complete  Independence Comments: wfl  TREATMENT  [facility] Adult PT Treatment:                                                DATE: 12/15/23 Pt seen for aquatic therapy today.  Treatment took place  in water 3.5-4.75 ft in depth at the [facility] pool. Temp of water was 91.  Pt entered/exited the pool via stairs using alternating pattern with hand  rail.  *walking forward and back in 3.6-4.3 ft with unsupported *yellow HB carry forward and back bilaterally then unilaterally *L stretch *standing: RBHB ue horizontal add/abd in staggered stances *bow & arrow *seated swing like on solid noodle: PPT *side stepping using RBHB ua add/abd x 4 widths *Return to L stretch *plank on bench with hip ext; mountain climbers *wall push up *cycling on noodle with HB ue support then without and breast stroke arms   Pt requires the buoyancy and hydrostatic pressure of water for support, and to offload joints by unweighting joint load by at least 50 % in navel deep water and by at least 75-80% in chest to neck deep water.  Viscosity of the water is needed for resistance of strengthening. Water current perturbations provides challenge to standing balance requiring increased core activation.                                                                                                                                  PATIENT EDUCATION:  Education details: Discussed eval findings, rehab rationale, aquatic program progression/POC and pools in area. Patient is in agreement  Person educated: Patient Education method: Chief Technology Officer Education comprehension: verbalized understanding  HOME EXERCISE PROGRAM: TBA for aquatics [id number] prior land based episode  ASSESSMENT:  CLINICAL IMPRESSION: Pt with continued improvement in thoracic pain with aquatic intervention.  Added UE movements for thoracic engagement with good toleration.  She is limited with plank on steps as it increases stiffness in area  which resolves for rhomboid engagement/wall push ups.  Goals ongoing      Initial Impression Patient is a 23 y.o. f who was seen today for physical therapy evaluation and treatment for LBP. She presents today with translator although with fairly good english skills.  She was seen by Pt x ~ 1year ago for same issue, returning today with reports of no improvements in pain symptoms with or after land based therapy with orders to trial aquatics.   She reports pain began after having epidural with birth of 1 yo child and is exacerbated by a medication she takes daily for glaucoma.  Lumbar MRI is neg.  She has moderate pain sensitivity throughout paraspinals in thoracolumbar spine.  Lumbar and thoracic movement wfl although with some pain at end range with extension. Strength in hips wfl (improved since eval last year) and no evidence of balance deficits.She continues to have anterior pelvic tilt posture.  Pt will benefit from a trial of aquatic PT to improve core strength and manage pain.    OBJECTIVE IMPAIRMENTS: postural dysfunction and pain.   ACTIVITY LIMITATIONS: carrying, lifting, bending, sitting, standing, squatting, and locomotion level   PARTICIPATION LIMITATIONS: meal prep, cleaning, laundry, shopping, and school   PERSONAL FACTORS: Age, Fitness,  Profession, and Time since onset of injury/illness/exacerbation are also affecting patient's functional outcome.    REHAB POTENTIAL: Good   CLINICAL DECISION MAKING: Stable/uncomplicated   EVALUATION COMPLEXITY: Low   GOALS: Goals reviewed with patient? Yes  SHORT TERM GOALS: Target date: 01/06/24  Pt will tolerate full aquatic sessions consistently without increase in pain and with improving function to demonstrate good toleration and effectiveness of intervention.  Baseline: Goal status: INITIAL  2.  Pt to improve on ODI by 12.8% (MCID) to demonstrate statistically significant Improvement in function. Baseline: 27/50=54% Goal  status: INITIAL  3.  Pt will report decrease in pain by at least 50% for improved toleration to activity/quality of life and to demonstrate improved management of pain. Baseline:  Goal status: INITIAL  4.  Pt will tolerate lumbar extension without Pain Baseline:  Goal status: INITIAL   LONG TERM GOALS: To be set at re-cert if approp   PLAN:  PT FREQUENCY: 1-2x/week  PT DURATION: 6 weeks  PLANNED INTERVENTIONS: 97164- PT Re-evaluation, 97750- Physical Performance Testing, 97110-Therapeutic exercises, 97530- Therapeutic activity, 97112- Neuromuscular re-education, 97535- Self Care, 02859- Manual therapy, [id number]- Gait training, [phone]- Aquatic Therapy, [phone]- Ionotophoresis 4mg /ml Dexamethasone, 79439 (1-2 muscles), 20561 (3+ muscles)- Dry Needling, Patient/Family education, Balance training, Stair training, Taping, Joint mobilization, DME instructions, Cryotherapy, and Moist heat.  PLAN FOR NEXT SESSION: Trial aquatics for pain management of chronic back pain thoracic through lumbar   Ronal Kem) Akeem Heppler MPT 12/15/23 4:09 PM [facility] [address] [place], [place], [address] Phone: [phone]   Fax:  [phone]   For all possible CPT codes, reference the Planned Interventions line above.     Check all conditions that are expected to impact treatment: {Conditions expected to impact treatment:Musculoskeletal disorders   If treatment provided at initial evaluation, no treatment charged due to lack of authorization.

## 2023-12-21 ENCOUNTER — Ambulatory Visit (HOSPITAL_BASED_OUTPATIENT_CLINIC_OR_DEPARTMENT_OTHER): Admitting: Physical Therapy

## 2023-12-21 ENCOUNTER — Encounter (HOSPITAL_BASED_OUTPATIENT_CLINIC_OR_DEPARTMENT_OTHER): Payer: Self-pay | Admitting: Physical Therapy

## 2023-12-21 DIAGNOSIS — M5459 Other low back pain: Secondary | ICD-10-CM

## 2023-12-21 DIAGNOSIS — M546 Pain in thoracic spine: Secondary | ICD-10-CM

## 2023-12-21 DIAGNOSIS — M6281 Muscle weakness (generalized): Secondary | ICD-10-CM

## 2023-12-21 NOTE — Therapy (Signed)
 OUTPATIENT PHYSICAL THERAPY THORACOLUMBAR TREATMENT   Patient Name: Jacqueline Miranda MRN: [id number] DOB:[birth year], 23 y.o., female Today's Date: 12/21/2023  END OF SESSION:  PT End of Session - 12/21/23 1151     Visit Number 5    Date for PT Re-Evaluation 01/06/24    Authorization Type [place] [employer] medicaid    PT Start Time 1148    PT Stop Time 1230    PT Time Calculation (min) 42 min    Activity Tolerance Patient tolerated treatment well    Behavior During Therapy WFL for tasks assessed/performed          Past Medical History:  Diagnosis Date   Anemia    Anemia in pregnancy 06/28/2020   Rx iron     Toothache 04/21/2020   Past Surgical History:  Procedure Laterality Date   TONSILLECTOMY     Patient Active Problem List   Diagnosis Date Noted   Dysmenorrhea 10/14/2023   Dizziness 07/16/2023   Language barrier affecting health care 07/16/2023   IIH (idiopathic intracranial hypertension) 02/22/2023   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   Scoliosis 02/20/2023   Chronic neck pain 02/19/2023   Iron deficiency anemia 02/06/2023   Impaired fasting glucose 02/06/2023   Syncope 02/03/2023   Migraine 02/03/2023   Stomach pain 03/29/2022   Chronic back pain 05/19/2021   Acute reaction to situational stress 09/04/2020   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   Leg pain, bilateral 04/21/2020   Vision abnormalities 04/21/2020    PCP: Bernardino Cone MD  REFERRING PROVIDER:   Lorilee Sven SQUIBB, MD    REFERRING DIAG: M54.50,G89.29 (ICD-10-CM) - Chronic bilateral low back pain without sciatica   Rationale for Evaluation and Treatment: Rehabilitation  THERAPY DIAG:  Other low back pain  Pain in thoracic spine  Muscle weakness (generalized)  ONSET DATE: chronic 2 + years  SUBJECTIVE:                                                                                                                                                                                           SUBJECTIVE  STATEMENT: Pt presents with interpreter.  Good response from last session pain continuing to reduce   Initial Subjective Pain started after epidural for child birth ~ 32yrs ago. Has not had any relief since.  Gets worse everyday when she takes her medicine which is treating her glaucoma.  Land based PT didn't really help.  Wants to try aquatics  PERTINENT HISTORY:  Episode of land based therapy ~ 1 year ago  PAIN:  Are you having pain? Yes: NPRS scale: current 0-1/10; worst 8/10 Pain location: mid throracic area up  towards shoulders Pain description: sharpe Aggravating factors: unknown unpredictable;glaucoma meds  Relieving factors: resting  PERTINENT HISTORY:  None    PAIN:  Are you having pain? Yes: NPRS scale: 8/10 Pain location: mid/lower back Pain description: shocking Aggravating factors: carrying,lifting, sitting Relieving factors: unknown   PRECAUTIONS: None   WEIGHT BEARING RESTRICTIONS: No   FALLS:  Has patient fallen in last 6 months? No   LIVING ENVIRONMENT: Lives with: lives with their family Lives in: House/apartment Stairs: No Has following equipment at home: None   OCCUPATION: student    PLOF: Independent  PATIENT GOALS: decrease pain  NEXT MD VISIT: next month  OBJECTIVE:  Note: Objective measures were completed at Evaluation unless otherwise noted.  DIAGNOSTIC FINDINGS:  MRI Lumbar spine IMPRESSION: 1. No findings to suggest discitis/osteomyelitis. 2. No significant stenosis.  PATIENT SURVEYS:  Modified Oswestry: 27/50  Interpretation of scores: Score Category Description  0-20% Minimal Disability The patient can cope with most living activities. Usually no treatment is indicated apart from advice on lifting, sitting and exercise  21-40% Moderate Disability The patient experiences more pain and difficulty with sitting, lifting and standing. Travel and social life are more difficult and they may be disabled from work. Personal care,  sexual activity and sleeping are not grossly affected, and the patient can usually be managed by conservative means  41-60% Severe Disability Pain remains the main problem in this group, but activities of daily living are affected. These patients require a detailed investigation  61-80% Crippled Back pain impinges on all aspects of the patient's life. Positive intervention is required  81-100% Bed-bound  These patients are either bed-bound or exaggerating their symptoms  Bluford FORBES [name], et al. Surgery versus conservative management of stable thoracolumbar fracture: the PRESTO feasibility RCT. [place] ([place]): [employer]; 2021 Nov. [facility] Technology Assessment, No. 25.62.) Appendix 3, Oswestry Disability Index category descriptors. Available from: FindJewelers.cz  Minimally Clinically Important Difference (MCID) = 12.8%  COGNITION: Overall cognitive status: Within functional limits for tasks assessed     SENSATION: WFL  MUSCLE LENGTH: Hamstrings: WNL Thomas test: (+) Hip flexor left  POSTURE: anterior pelvic tilt  PALPATION: TTP bilateral thoracolumbar paraspinals  LUMBAR ROM:   Full.  P! with extension  LOWER EXTREMITY ROM:     wfl  LOWER EXTREMITY STRENGTH:    HD (Lbs) Right eval Left eval  Hip flexion 61.2 67.7  Hip extension    Hip abduction 28.5 33.0  Hip adduction    Hip internal rotation    Hip external rotation    Knee flexion    Knee extension    Ankle dorsiflexion    Ankle plantarflexion    Ankle inversion    Ankle eversion     (Blank rows = not tested)  LUMBAR SPECIAL TESTS:  Straight leg raise test: Negative, Slump test: Negative, and SI Compression/distraction test: Negative  FUNCTIONAL TESTS:  5 times sit to stand: 17.37 4 stage balance: Passed x 4  GAIT: Distance walked: 400 Assistive device utilized: None Level of assistance: Complete Independence Comments: wfl  TREATMENT  [facility]  Adult PT Treatment:                                                DATE: 12/21/23 Pt seen for aquatic therapy today.  Treatment took place in water 3.5-4.75 ft in depth at  the [facility] pool. Temp of water was 91.  Pt entered/exited the pool via stairs using alternating pattern with hand  rail.  *walking forward and back in 3.6-4.3 ft with unsupported *yellow HB carry forward and back bilaterally then unilaterally *L stretch *standing: RBHB ue horizontal add/abd in staggered stances *bow & arrow *wall push up 2 x 10 *Return to L stretch *BKTC feet in 2nd rung on ladder   Pt requires the buoyancy and hydrostatic pressure of water for support, and to offload joints by unweighting joint load by at least 50 % in navel deep water and by at least 75-80% in chest to neck deep water.  Viscosity of the water is needed for resistance of strengthening. Water current perturbations provides challenge to standing balance requiring increased core activation.                                                                                                                                  PATIENT EDUCATION:  Education details: Discussed eval findings, rehab rationale, aquatic program progression/POC and pools in area. Patient is in agreement  Person educated: Patient Education method: Chief Technology [employer] Education comprehension: verbalized understanding  HOME EXERCISE PROGRAM: TBA for aquatics [id number] prior land based episode  ASSESSMENT:  CLINICAL IMPRESSION: Progressed stretching with BKTC at steps.  Pt reports release of LB tightness after initial apprehension of position. Continued reports of decreasing pain sensitivity as well as improvement in toleration to activity. Pt encouraged to return to exercising in her gym.  She is instructed to limited sessions to 30 minutes an to begin without increasing  weight on machines.  She VU       Initial Impression Patient is a 23 y.o.  f who was seen today for physical therapy evaluation and treatment for LBP. She presents today with translator although with fairly good english skills.  She was seen by Pt x ~ 1year ago for same issue, returning today with reports of no improvements in pain symptoms with or after land based therapy with orders to trial aquatics.   She reports pain began after having epidural with birth of 1 yo child and is exacerbated by a medication she takes daily for glaucoma.  Lumbar MRI is neg.  She has moderate pain sensitivity throughout paraspinals in thoracolumbar spine.  Lumbar and thoracic movement wfl although with some pain at end range with extension. Strength in hips wfl (improved since eval last year) and no evidence of balance deficits.She continues to have anterior pelvic tilt posture.  Pt will benefit from a trial of aquatic PT to improve core strength and manage pain.    OBJECTIVE IMPAIRMENTS: postural dysfunction and pain.   ACTIVITY LIMITATIONS: carrying, lifting, bending, sitting, standing, squatting, and locomotion level   PARTICIPATION LIMITATIONS: meal prep, cleaning, laundry, shopping, and school   PERSONAL FACTORS: Age, Fitness, Profession, and Time since onset of injury/illness/exacerbation are also affecting patient's functional outcome.  REHAB POTENTIAL: Good   CLINICAL DECISION MAKING: Stable/uncomplicated   EVALUATION COMPLEXITY: Low   GOALS: Goals reviewed with patient? Yes  SHORT TERM GOALS: Target date: 01/06/24  Pt will tolerate full aquatic sessions consistently without increase in pain and with improving function to demonstrate good toleration and effectiveness of intervention.  Baseline: Goal status: Met 12/21/23  2.  Pt to improve on ODI by 12.8% (MCID) to demonstrate statistically significant Improvement in function. Baseline: 27/50=54% Goal status: INITIAL  3.  Pt will report decrease in pain by at least 50% for improved toleration to activity/quality of life  and to demonstrate improved management of pain. Baseline:  Goal status: In progress 12/21/23  4.  Pt will tolerate lumbar extension without Pain Baseline:  Goal status: INITIAL   LONG TERM GOALS: To be set at re-cert if approp   PLAN:  PT FREQUENCY: 1-2x/week  PT DURATION: 6 weeks  PLANNED INTERVENTIONS: 97164- PT Re-evaluation, 97750- Physical Performance Testing, 97110-Therapeutic exercises, 97530- Therapeutic activity, 97112- Neuromuscular re-education, 97535- Self Care, 02859- Manual therapy, [id number]- Gait training, [id number]- Aquatic Therapy, [phone]- Ionotophoresis 4mg /ml Dexamethasone, 79439 (1-2 muscles), 20561 (3+ muscles)- Dry Needling, Patient/Family education, Balance training, Stair training, Taping, Joint mobilization, DME instructions, Cryotherapy, and Moist heat.  PLAN FOR NEXT SESSION: Trial aquatics for pain management of chronic back pain thoracic through lumbar   Ronal Kem) Matti Minney MPT 12/21/23 12:20 PM [facility] [address] [place], [place], [address] Phone: [phone]   Fax:  [phone]   For all possible CPT codes, reference the Planned Interventions line above.     Check all conditions that are expected to impact treatment: {Conditions expected to impact treatment:Musculoskeletal disorders   If treatment provided at initial evaluation, no treatment charged due to lack of authorization.

## 2023-12-27 ENCOUNTER — Encounter (HOSPITAL_BASED_OUTPATIENT_CLINIC_OR_DEPARTMENT_OTHER): Payer: Self-pay | Admitting: Physical Therapy

## 2023-12-27 ENCOUNTER — Ambulatory Visit (HOSPITAL_BASED_OUTPATIENT_CLINIC_OR_DEPARTMENT_OTHER): Admitting: Physical Therapy

## 2023-12-27 DIAGNOSIS — M5459 Other low back pain: Secondary | ICD-10-CM | POA: Diagnosis not present

## 2023-12-27 DIAGNOSIS — M6281 Muscle weakness (generalized): Secondary | ICD-10-CM

## 2023-12-27 DIAGNOSIS — M546 Pain in thoracic spine: Secondary | ICD-10-CM

## 2023-12-27 NOTE — Therapy (Signed)
 OUTPATIENT PHYSICAL THERAPY THORACOLUMBAR TREATMENT   Patient Name: Jacqueline Miranda MRN: [id number] DOB:01/24/2001, 23 y.o., female Today's Date: 12/27/2023  END OF SESSION:  PT End of Session - 12/27/23 1455     Visit Number 6    Date for PT Re-Evaluation 01/06/24    Authorization Type [place] [employer] medicaid    PT Start Time 1448    PT Stop Time 1530    PT Time Calculation (min) 42 min    Activity Tolerance Patient tolerated treatment well    Behavior During Therapy [facility] for tasks assessed/performed          Past Medical History:  Diagnosis Date   Anemia    Anemia in pregnancy 06/28/2020   Rx iron     Toothache 04/21/2020   Past Surgical History:  Procedure Laterality Date   TONSILLECTOMY     Patient Active Problem List   Diagnosis Date Noted   Dysmenorrhea 10/14/2023   Dizziness 07/16/2023   Language barrier affecting health care 07/16/2023   IIH (idiopathic intracranial hypertension) 02/22/2023   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   Scoliosis 02/20/2023   Chronic neck pain 02/19/2023   Iron deficiency anemia 02/06/2023   Impaired fasting glucose 02/06/2023   Syncope 02/03/2023   Migraine 02/03/2023   Stomach pain 03/29/2022   Chronic back pain 05/19/2021   Acute reaction to situational stress 09/04/2020   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   Leg pain, bilateral 04/21/2020   Vision abnormalities 04/21/2020    PCP: Bernardino Cone MD  REFERRING PROVIDER:   Lorilee Sven SQUIBB, MD    REFERRING DIAG: M54.50,G89.29 (ICD-10-CM) - Chronic bilateral low back pain without sciatica   Rationale for Evaluation and Treatment: Rehabilitation  THERAPY DIAG:  Other low back pain  Pain in thoracic spine  Muscle weakness (generalized)  ONSET DATE: chronic 2 + years  SUBJECTIVE:                                                                                                                                                                                           SUBJECTIVE  STATEMENT: Pt presents with interpreter.  Headache x 2 days/migraine.  Sometimes I can't see its so bad 3/10 now.  Back pain 0/10 now. Hurt really bad on Sunday at church for a few hours 7/10 at the time.   Initial Subjective Pain started after epidural for child birth ~ 32yrs ago. Has not had any relief since.  Gets worse everyday when she takes her medicine which is treating her glaucoma.  Land based PT didn't really help.  Wants to try aquatics  PERTINENT HISTORY:  Episode of land based  therapy ~ 1 year ago  PAIN:  Are you having pain? Yes: NPRS scale: current 0-1/10; worst 8/10 Pain location: mid throracic area up towards shoulders Pain description: sharpe Aggravating factors: unknown unpredictable;glaucoma meds  Relieving factors: resting  PERTINENT HISTORY:  None    PAIN:  Are you having pain? Yes: NPRS scale: 8/10 Pain location: mid/lower back Pain description: shocking Aggravating factors: carrying,lifting, sitting Relieving factors: unknown   PRECAUTIONS: None   WEIGHT BEARING RESTRICTIONS: No   FALLS:  Has patient fallen in last 6 months? No   LIVING ENVIRONMENT: Lives with: lives with their family Lives in: House/apartment Stairs: No Has following equipment at home: None   OCCUPATION: student    PLOF: Independent  PATIENT GOALS: decrease pain  NEXT MD VISIT: next month  OBJECTIVE:  Note: Objective measures were completed at Evaluation unless otherwise noted.  DIAGNOSTIC FINDINGS:  MRI Lumbar spine IMPRESSION: 1. No findings to suggest discitis/osteomyelitis. 2. No significant stenosis.  PATIENT SURVEYS:  Modified Oswestry: 27/50  Interpretation of scores: Score Category Description  0-20% Minimal Disability The patient can cope with most living activities. Usually no treatment is indicated apart from advice on lifting, sitting and exercise  21-40% Moderate Disability The patient experiences more pain and difficulty with sitting, lifting and  standing. Travel and social life are more difficult and they may be disabled from work. Personal care, sexual activity and sleeping are not grossly affected, and the patient can usually be managed by conservative means  41-60% Severe Disability Pain remains the main problem in this group, but activities of daily living are affected. These patients require a detailed investigation  61-80% Crippled Back pain impinges on all aspects of the patient's life. Positive intervention is required  81-100% Bed-bound  These patients are either bed-bound or exaggerating their symptoms  Bluford FORBES [name], et al. Surgery versus conservative management of stable thoracolumbar fracture: the PRESTO feasibility RCT. [place] ([place]): [employer]; 2021 Nov. [facility] Technology Assessment, No. 25.62.) Appendix 3, Oswestry Disability Index category descriptors. Available from: FindJewelers.cz  Minimally Clinically Important Difference (MCID) = 12.8%  COGNITION: Overall cognitive status: Within functional limits for tasks assessed     SENSATION: WFL  MUSCLE LENGTH: Hamstrings: WNL Thomas test: (+) Hip flexor left  POSTURE: anterior pelvic tilt  PALPATION: TTP bilateral thoracolumbar paraspinals  LUMBAR ROM:   Full.  P! with extension  LOWER EXTREMITY ROM:     wfl  LOWER EXTREMITY STRENGTH:    HD (Lbs) Right eval Left eval  Hip flexion 61.2 67.7  Hip extension    Hip abduction 28.5 33.0  Hip adduction    Hip internal rotation    Hip external rotation    Knee flexion    Knee extension    Ankle dorsiflexion    Ankle plantarflexion    Ankle inversion    Ankle eversion     (Blank rows = not tested)  LUMBAR SPECIAL TESTS:  Straight leg raise test: Negative, Slump test: Negative, and SI Compression/distraction test: Negative  FUNCTIONAL TESTS:  5 times sit to stand: 17.37 4 stage balance: Passed x 4  GAIT: Distance walked:  400 Assistive device utilized: None Level of assistance: Complete Independence Comments: wfl  TREATMENT  [facility] Adult PT Treatment:  DATE: 12/27/23 Pt seen for aquatic therapy today.  Treatment took place in water 3.5-4.75 ft in depth at the [facility] pool. Temp of water was 91.  Pt entered/exited the pool via stairs using alternating pattern with hand  rail.  *walking forward and back in 3.6-4.3 ft with unsupported *yellow HB carry forward and back bilaterally then unilaterally *L stretch with hip hiking at stairs *horizontal add/abd *bow & arrow *BKTC feet in 2nd rung on ladder *wall push up 2 x 10 *Return to L stretch *cycling on noodle  Pt requires the buoyancy and hydrostatic pressure of water for support, and to offload joints by unweighting joint load by at least 50 % in navel deep water and by at least 75-80% in chest to neck deep water.  Viscosity of the water is needed for resistance of strengthening. Water current perturbations provides challenge to standing balance requiring increased core activation.                                                                                                                                  PATIENT EDUCATION:  Education details: Discussed eval findings, rehab rationale, aquatic program progression/POC and pools in area. Patient is in agreement  Person educated: Patient Education method: Chief Technology Officer Education comprehension: verbalized understanding  HOME EXERCISE PROGRAM: TBA for aquatics [id number] prior land based episode  ASSESSMENT:  CLINICAL IMPRESSION: Pt reports increase in back pain over w/e as well as a migraine.  She arrives with back pain 0/10 but does have some discomfort as session progresses. BKTC continues to relieve back tightness. Pain continues to be undefined. Waxes and wanes without etiology. Further chart review indicates a slight  scoliosis  but is not defined well.  May be cause of some back pain. Next session last scheduled.  Will test then discuss with pt findings and potential plan going forward.      Initial Impression Patient is a 23 y.o. f who was seen today for physical therapy evaluation and treatment for LBP. She presents today with translator although with fairly good english skills.  She was seen by Pt x ~ 1year ago for same issue, returning today with reports of no improvements in pain symptoms with or after land based therapy with orders to trial aquatics.   She reports pain began after having epidural with birth of 1 yo child and is exacerbated by a medication she takes daily for glaucoma.  Lumbar MRI is neg.  She has moderate pain sensitivity throughout paraspinals in thoracolumbar spine.  Lumbar and thoracic movement wfl although with some pain at end range with extension. Strength in hips wfl (improved since eval last year) and no evidence of balance deficits.She continues to have anterior pelvic tilt posture.  Pt will benefit from a trial of aquatic PT to improve core strength and manage pain.    OBJECTIVE IMPAIRMENTS: postural dysfunction and pain.   ACTIVITY LIMITATIONS: carrying, lifting,  bending, sitting, standing, squatting, and locomotion level   PARTICIPATION LIMITATIONS: meal prep, cleaning, laundry, shopping, and school   PERSONAL FACTORS: Age, Fitness, Profession, and Time since onset of injury/illness/exacerbation are also affecting patient's functional outcome.    REHAB POTENTIAL: Good   CLINICAL DECISION MAKING: Stable/uncomplicated   EVALUATION COMPLEXITY: Low   GOALS: Goals reviewed with patient? Yes  SHORT TERM GOALS: Target date: 01/06/24  Pt will tolerate full aquatic sessions consistently without increase in pain and with improving function to demonstrate good toleration and effectiveness of intervention.  Baseline: Goal status: Met 12/21/23  2.  Pt to improve on ODI by 12.8% (MCID)  to demonstrate statistically significant Improvement in function. Baseline: 27/50=54% Goal status: INITIAL  3.  Pt will report decrease in pain by at least 50% for improved toleration to activity/quality of life and to demonstrate improved management of pain. Baseline:  Goal status: In progress 12/21/23  4.  Pt will tolerate lumbar extension without Pain Baseline:  Goal status: INITIAL   LONG TERM GOALS: To be set at re-cert if approp   PLAN:  PT FREQUENCY: 1-2x/week  PT DURATION: 6 weeks  PLANNED INTERVENTIONS: 97164- PT Re-evaluation, 97750- Physical Performance Testing, 97110-Therapeutic exercises, 97530- Therapeutic activity, 97112- Neuromuscular re-education, 97535- Self Care, 02859- Manual therapy, [id number]- Gait training, [phone]- Aquatic Therapy, [phone]- Ionotophoresis 4mg /ml Dexamethasone, 79439 (1-2 muscles), 20561 (3+ muscles)- Dry Needling, Patient/Family education, Balance training, Stair training, Taping, Joint mobilization, DME instructions, Cryotherapy, and Moist heat.  PLAN FOR NEXT SESSION: Trial aquatics for pain management of chronic back pain thoracic through lumbar   Ronal Kem) Stanlee Roehrig MPT 12/27/23 3:15 PM [facility] [address] [place], [place], [address] Phone: [phone]   Fax:  [phone]   For all possible CPT codes, reference the Planned Interventions line above.     Check all conditions that are expected to impact treatment: {Conditions expected to impact treatment:Musculoskeletal disorders   If treatment provided at initial evaluation, no treatment charged due to lack of authorization.

## 2024-01-03 ENCOUNTER — Encounter (HOSPITAL_BASED_OUTPATIENT_CLINIC_OR_DEPARTMENT_OTHER): Payer: Self-pay | Admitting: Physical Therapy

## 2024-01-03 ENCOUNTER — Ambulatory Visit (HOSPITAL_BASED_OUTPATIENT_CLINIC_OR_DEPARTMENT_OTHER): Attending: Physical Medicine and Rehabilitation | Admitting: Physical Therapy

## 2024-01-03 DIAGNOSIS — M5459 Other low back pain: Secondary | ICD-10-CM | POA: Diagnosis present

## 2024-01-03 DIAGNOSIS — M546 Pain in thoracic spine: Secondary | ICD-10-CM | POA: Diagnosis present

## 2024-01-03 DIAGNOSIS — M6281 Muscle weakness (generalized): Secondary | ICD-10-CM | POA: Diagnosis present

## 2024-01-03 NOTE — Therapy (Signed)
 OUTPATIENT PHYSICAL THERAPY THORACOLUMBAR TREATMENT PHYSICAL THERAPY DISCHARGE SUMMARY  Visits from Start of Care: 6  Current functional level related to goals / functional outcomes: Indep but cont to have pain   Remaining deficits: Chronic pain   Education / Equipment: Management of condition/Land based HEP   Patient agrees to discharge. Patient goals were partially met 1/4 goals met. Patient is being discharged due to lack of progress.    Patient Name: Jacqueline Miranda MRN: [id number] DOB:2000-09-19, 24 y.o., female Today's Date: 01/03/2024  END OF SESSION:  PT End of Session - 01/03/24 1516     Visit Number 7    Date for PT Re-Evaluation 01/06/24    Authorization Type [place] [employer] medicaid    PT Start Time 1445    PT Stop Time 1545    PT Time Calculation (min) 60 min    Activity Tolerance Patient tolerated treatment well    Behavior During Therapy [facility] for tasks assessed/performed          Past Medical History:  Diagnosis Date   Anemia    Anemia in pregnancy 06/28/2020   Rx iron     Toothache 04/21/2020   Past Surgical History:  Procedure Laterality Date   TONSILLECTOMY     Patient Active Problem List   Diagnosis Date Noted   Dysmenorrhea 10/14/2023   Dizziness 07/16/2023   Language barrier affecting health care 07/16/2023   IIH (idiopathic intracranial hypertension) 02/22/2023   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   Scoliosis 02/20/2023   Chronic neck pain 02/19/2023   Iron deficiency anemia 02/06/2023   Impaired fasting glucose 02/06/2023   Syncope 02/03/2023   Migraine 02/03/2023   Stomach pain 03/29/2022   Chronic back pain 05/19/2021   Acute reaction to situational stress 09/04/2020   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   Leg pain, bilateral 04/21/2020   Vision abnormalities 04/21/2020    PCP: Bernardino Cone MD  REFERRING PROVIDER:   Lorilee Sven SQUIBB, MD    REFERRING DIAG: M54.50,G89.29 (ICD-10-CM) - Chronic bilateral low back pain without sciatica    Rationale for Evaluation and Treatment: Rehabilitation  THERAPY DIAG:  Other low back pain  Pain in thoracic spine  Muscle weakness (generalized)  ONSET DATE: chronic 2 + years  SUBJECTIVE:                                                                                                                                                                                           SUBJECTIVE STATEMENT: Pt presents with interpreter.  Took med early today so my back is hurting 5/10   Initial Subjective Pain started after epidural for child birth ~  83yrs ago. Has not had any relief since.  Gets worse everyday when she takes her medicine which is treating her glaucoma.  Land based PT didn't really help.  Wants to try aquatics  PERTINENT HISTORY:  Episode of land based therapy ~ 1 year ago  PAIN:  Are you having pain? Yes: NPRS scale: current 0-1/10; worst 8/10 Pain location: mid throracic area up towards shoulders Pain description: sharpe Aggravating factors: unknown unpredictable;glaucoma meds  Relieving factors: resting  PERTINENT HISTORY:  None    PAIN:  Are you having pain? Yes: NPRS scale: 8/10 Pain location: mid/lower back Pain description: shocking Aggravating factors: carrying,lifting, sitting Relieving factors: unknown   PRECAUTIONS: None   WEIGHT BEARING RESTRICTIONS: No   FALLS:  Has patient fallen in last 6 months? No   LIVING ENVIRONMENT: Lives with: lives with their family Lives in: House/apartment Stairs: No Has following equipment at home: None   OCCUPATION: student    PLOF: Independent  PATIENT GOALS: decrease pain  NEXT MD VISIT: next month  OBJECTIVE:  Note: Objective measures were completed at Evaluation unless otherwise noted.  DIAGNOSTIC FINDINGS:  MRI Lumbar spine IMPRESSION: 1. No findings to suggest discitis/osteomyelitis. 2. No significant stenosis.  PATIENT SURVEYS:  Modified Oswestry: 27/50 01/03/24: 31/50  Interpretation of  scores: Score Category Description  0-20% Minimal Disability The patient can cope with most living activities. Usually no treatment is indicated apart from advice on lifting, sitting and exercise  21-40% Moderate Disability The patient experiences more pain and difficulty with sitting, lifting and standing. Travel and social life are more difficult and they may be disabled from work. Personal care, sexual activity and sleeping are not grossly affected, and the patient can usually be managed by conservative means  41-60% Severe Disability Pain remains the main problem in this group, but activities of daily living are affected. These patients require a detailed investigation  61-80% Crippled Back pain impinges on all aspects of the patient's life. Positive intervention is required  81-100% Bed-bound  These patients are either bed-bound or exaggerating their symptoms  Bluford FORBES [name], et al. Surgery versus conservative management of stable thoracolumbar fracture: the PRESTO feasibility RCT. [place] ([place]): [employer]; 2021 Nov. [facility] Technology Assessment, No. 25.62.) Appendix 3, Oswestry Disability Index category descriptors. Available from: FindJewelers.cz  Minimally Clinically Important Difference (MCID) = 12.8%  COGNITION: Overall cognitive status: Within functional limits for tasks assessed     SENSATION: WFL  MUSCLE LENGTH: Hamstrings: WNL Thomas test: (+) Hip flexor left  POSTURE: anterior pelvic tilt  PALPATION: TTP bilateral thoracolumbar paraspinals  LUMBAR ROM:   Full.  P! with extension  01/03/24 Pain with extension  LOWER EXTREMITY ROM:     wfl  LOWER EXTREMITY STRENGTH:    HD (Lbs) Right eval Left eval  Hip flexion 61.2 67.7  Hip extension    Hip abduction 28.5 33.0  Hip adduction    Hip internal rotation    Hip external rotation    Knee flexion    Knee extension    Ankle dorsiflexion    Ankle  plantarflexion    Ankle inversion    Ankle eversion     (Blank rows = not tested)  LUMBAR SPECIAL TESTS:  Straight leg raise test: Negative, Slump test: Negative, and SI Compression/distraction test: Negative  FUNCTIONAL TESTS:  5 times sit to stand: 17.37 4 stage balance: Passed x 4  GAIT: Distance walked: 400 Assistive device utilized: None Level of assistance: Complete Independence Comments:  wfl  TREATMENT  [facility] Adult PT Treatment:                                                DATE: 01/03/24 Pt seen for aquatic therapy today.  Treatment took place in water 3.5-4.75 ft in depth at the [facility] pool. Temp of water was 91.  Pt entered/exited the pool via stairs using alternating pattern with hand  rail.  *walking forward and back in 3.6-4.3 ft with unsupported *yellow HB carry forward and back bilaterally then unilaterally *L stretch with hip hiking at stairs *horizontal add/abd *bow & arrow *wall push up 2 x 10 *Return to L stretch   Pt requires the buoyancy and hydrostatic pressure of water for support, and to offload joints by unweighting joint load by at least 50 % in navel deep water and by at least 75-80% in chest to neck deep water.  Viscosity of the water is needed for resistance of strengthening. Water current perturbations provides challenge to standing balance requiring increased core activation.                                                                                                                                  PATIENT EDUCATION:  Education details: Discussed eval findings, rehab rationale, aquatic program progression/POC and pools in area. Patient is in agreement  Person educated: Patient Education method: Chief Technology Officer Education comprehension: verbalized understanding  HOME EXERCISE PROGRAM: TBA for aquatics [email or web address] prior land based episode  ASSESSMENT:  CLINICAL IMPRESSION: Pt reports no lasting changes in back pain.   Discussion in depth regarding lack of progress. She continues to have high pain after taking medicine for glaucoma daily in back as well as head. Etiology of back pain not clear.  It is not constant but does occur every day. She has had occasional reduction in pain but I am skeptical that the therapy intervention was responsible due to the initial report  (evaluation) of waxing and waning pain. She has had similar results when participating in land based therapy a year ago. She is to see an ophthalmologist next week to address her glaucoma (for which the medicine she takes that causes the onset of high pain; Diamox ) and is encouraged to relay all the side effects she is having from the med. She has met 1/4 goals due to back pain. She is encouraged to continue with the land based HEP that she received at last episode. She does not have pool access so aquatic HEP not assigned.        Initial Impression Patient is a 23 y.o. f who was seen today for physical therapy evaluation and treatment for LBP. She presents today with translator although with fairly good english skills.  She was seen by Pt x ~ 1year  ago for same issue, returning today with reports of no improvements in pain symptoms with or after land based therapy with orders to trial aquatics.   She reports pain began after having epidural with birth of 1 yo child and is exacerbated by a medication she takes daily for glaucoma.  Lumbar MRI is neg.  She has moderate pain sensitivity throughout paraspinals in thoracolumbar spine.  Lumbar and thoracic movement wfl although with some pain at end range with extension. Strength in hips wfl (improved since eval last year) and no evidence of balance deficits.She continues to have anterior pelvic tilt posture.  Pt will benefit from a trial of aquatic PT to improve core strength and manage pain.    OBJECTIVE IMPAIRMENTS: postural dysfunction and pain.   ACTIVITY LIMITATIONS: carrying, lifting, bending,  sitting, standing, squatting, and locomotion level   PARTICIPATION LIMITATIONS: meal prep, cleaning, laundry, shopping, and school   PERSONAL FACTORS: Age, Fitness, Profession, and Time since onset of injury/illness/exacerbation are also affecting patient's functional outcome.    REHAB POTENTIAL: Good   CLINICAL DECISION MAKING: Stable/uncomplicated   EVALUATION COMPLEXITY: Low   GOALS: Goals reviewed with patient? Yes  SHORT TERM GOALS: Target date: 01/06/24  Pt will tolerate full aquatic sessions consistently without increase in pain and with improving function to demonstrate good toleration and effectiveness of intervention.  Baseline: Goal status: Met 12/21/23  2.  Pt to improve on ODI by 12.8% (MCID) to demonstrate statistically significant Improvement in function. Baseline: 27/50=54%; 31/50= 62% Goal status: Not Met  3.  Pt will report decrease in pain by at least 50% for improved toleration to activity/quality of life and to demonstrate improved management of pain. Baseline:  Goal status: In progress 12/21/23; Not Met 01/03/24  4.  Pt will tolerate lumbar extension without Pain Baseline:  Goal status: Not met 01/03/24   LONG TERM GOALS: To be set at re-cert if approp   PLAN:  PT FREQUENCY: 1-2x/week  PT DURATION: 6 weeks  PLANNED INTERVENTIONS: 97164- PT Re-evaluation, 97750- Physical Performance Testing, 97110-Therapeutic exercises, 97530- Therapeutic activity, 97112- Neuromuscular re-education, 97535- Self Care, 02859- Manual therapy, [id number]- Gait training, [phone]- Aquatic Therapy, [phone]- Ionotophoresis 4mg /ml Dexamethasone, 79439 (1-2 muscles), 20561 (3+ muscles)- Dry Needling, Patient/Family education, Balance training, Stair training, Taping, Joint mobilization, DME instructions, Cryotherapy, and Moist heat.  PLAN FOR NEXT SESSION: Trial aquatics for pain management of chronic back pain thoracic through lumbar   Ronal Foots) Shanyce Daris MPT 01/03/24 3:38 PM [facility] [address] [place], [place], [address] Phone: [phone]   Fax:  [phone]   For all possible CPT codes, reference the Planned Interventions line above.     Check all conditions that are expected to impact treatment: {Conditions expected to impact treatment:Musculoskeletal disorders   If treatment provided at initial evaluation, no treatment charged due to lack of authorization.

## 2024-01-03 NOTE — Therapy (Deleted)
 OUTPATIENT PHYSICAL THERAPY TREATMENT NOTE  PHYSICAL THERAPY DISCHARGE SUMMARY  Visits from Start of Care: 10  Current functional level related to goals / functional outcomes: See goals below    Remaining deficits: See impression below    Education / Equipment: See education below    Patient agrees to discharge. Patient goals were partially met. Patient is being discharged due to lack of progress.  Patient Name: Jacqueline Miranda MRN: [id number] DOB:2000-08-14, 23 y.o., female Today's Date: 01/03/2024  PCP: Virgil Beards, DO     REFERRING PROVIDER: Scarlet Elsie LABOR, MD   END OF SESSION:        Past Medical History:  Diagnosis Date   Anemia    Anemia in pregnancy 06/28/2020   Rx iron     Toothache 04/21/2020   Past Surgical History:  Procedure Laterality Date   TONSILLECTOMY     Patient Active Problem List   Diagnosis Date Noted   Dysmenorrhea 10/14/2023   Dizziness 07/16/2023   Language barrier affecting health care 07/16/2023   IIH (idiopathic intracranial hypertension) 02/22/2023   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   Scoliosis 02/20/2023   Chronic neck pain 02/19/2023   Iron deficiency anemia 02/06/2023   Impaired fasting glucose 02/06/2023   Syncope 02/03/2023   Migraine 02/03/2023   Stomach pain 03/29/2022   Chronic back pain 05/19/2021   Acute reaction to situational stress 09/04/2020   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   Leg pain, bilateral 04/21/2020   Vision abnormalities 04/21/2020    REFERRING DIAG: M54.50,G89.29 (ICD-10-CM) - Chronic bilateral low back pain without sciatica   THERAPY DIAG:  No diagnosis found.  Rationale for Evaluation and Treatment rehabilitation  PERTINENT HISTORY: None  PRECAUTIONS: None  SUBJECTIVE:                                                                                                                                                                                     Patient declined interpreter.  SUBJECTIVE  STATEMENT: Patient reports her back is feeling good right now without pain. She has been completing some of her HEP.   PAIN:  Are you having pain? No    OBJECTIVE: (objective measures completed at initial evaluation unless otherwise dated)   DIAGNOSTIC FINDINGS:  None    PATIENT SURVEYS:  N/A language    SCREENING FOR RED FLAGS: Bowel or bladder incontinence: No Spinal tumors: No Cauda equina syndrome: No Compression fracture: No Abdominal aneurysm: No   COGNITION: Overall cognitive status: Within functional limits for tasks assessed                          SENSATION:  WFL   MUSCLE LENGTH: Hamstrings: WNL Thomas test: (+) Hip flexor bilaterally    POSTURE: anterior pelvic tilt   PALPATION: TTP bilateral thoracolumbar paraspinals    LUMBAR ROM:    AROM eval 05/14/22 05/21/22 06/25/22  Flexion WNL LBP WNL WNL LBP WNL  Extension WNL LBP WNL LBP WNL LBP WNL  Right lateral flexion WNL  WNL WNL  Left lateral flexion WNL  WNL WNL  Right rotation WNL  WNL WNL  Left rotation WNL  WNL WNL   (Blank rows = not tested)   LOWER EXTREMITY MMT:     MMT Right eval Left eval Right 05/14/22 Left 05/14/22 05/21/22  06/18/22 06/24/22  Hip flexion 4- 4- 4 4+ Bilateral 4/5   4/5  bilateral   Hip extension 3+ 3+ 3+ 4- Bilateral 4/5   Bilateral 4/5   Hip abduction 3+ 3+ 4 (gives after 2 sec) 4 (gives after 2 sec) Bilateral 4-/5  4-/5 bilateral  Hip adduction           Hip internal rotation           Hip external rotation           Knee flexion 5 5       Knee extension 5 5       Ankle dorsiflexion 5 5       Ankle plantarflexion 5 5       Ankle inversion           Core 2                                3 3 3    (Blank rows = not tested)   LUMBAR SPECIAL TESTS:  (-) SLR    FUNCTIONAL TESTS:  Plank: unable : 04/30/22: able to hold 10 sec: 05/14/22: 25 sec Squat: excessive trunk flexion, limited hip/knee flexion.  05/14/22: Ab crunch: can get scapula off mat   05/21/22: 33  seconds plank; squat: excessive trunk flexion   06/11/22 plank: 21 seconds   06/24/22: plank 28 seconds    GAIT: Distance walked: 10 ft  Assistive device utilized: None Level of assistance: Complete Independence Comments: WNL [facility] Adult PT Treatment:                                                DATE: 06/25/22 Therapeutic Exercise: Figure 4 bridge 2 x 10  SLR with posterior pelvic tilt x 10 each  90/90 hold x 30 sec Supine TA march x 20  Sideplank x 30 sec each  Sidelying hip circles x 10 each cw/ccw Quadruped arm extension 2 x 10  Seated TA march x 10  Sit to stand x 10  Wall squat x 10  Hip flexor stretch x 30 sec  Reviewed and updated HEP  Therapeutic Activity: Re-assessment to determine overall progress.    [facility] Adult PT Treatment:                                                DATE: 06/18/22 Therapeutic Exercise: Elliptical level 3 x 5 minutes  Dead bug, 90/90 leg extension, 90/90 heel taps all attempted d/c due to  pain Supine bent knee fallout 2 x 10  Supine TA march 2 x 10  Figure 4 hip bridge 1 x 10 each Plank 30 sec, 25 sec, 9 sec Deadlift 6# dumbbells 2 x 10      [facility] Adult PT Treatment:                                                DATE: 06/11/22 Therapeutic Exercise: Elliptical level 3 x 5 minutes  Seated TA march 2 x 10  Seated march with opposite arm reaching 2 x 10  Lunge hip flexor stretch 2 x 30 sec  Plank 5 trials to fatigue 15 seconds- 21 seconds  Fire hydrant 2 x 10  Quadruped leg extension 2 x 10  Updated HEP        PATIENT EDUCATION:  Education details: see treatment; d/c education Person educated: Patient Education method: Explanation, handout, cues Education comprehension: verbalized understanding, returned demo   HOME EXERCISE PROGRAM: Access Code: [email or web address] URL: [email or web address] Date: 04/08/2022 Prepared by: Lucie Meeter   Exercises - Supine Bridge  - 2 x daily - 7 x weekly - 2 sets - 10 reps - Sidelying  Hip Abduction  - 2 x daily - 7 x weekly - 2 sets - 10 reps - Supine Active Straight Leg Raise  - 2 x daily - 7 x weekly - 2 sets - 10 reps - Supine Posterior Pelvic Tilt  - 2 x daily - 7 x weekly - 2 sets - 10 reps Added 04/16/22 - Supine 90/90 Abdominal Bracing  - 2 x daily - 7 x weekly - 1 sets - 10 reps - 5 hold - Bird Dog  - 2 x daily - 7 x weekly - 1 sets - 10 reps - 5 hold Added 04/30/22 - Standard Plank  - 1 x daily - 7 x weekly - 1 sets - 5 reps - 10-20 hold - Sit to stand with control  - 1 x daily - 7 x weekly - 2 sets - 10 reps   ASSESSMENT:   CLINICAL IMPRESSION: Patient reports an overall improvement in her back pain since the start of care. Patient demonstrates no change in objective strength measurements compared to previous re-evaluation and reports minimal adherence to HEP. Due to lack of progress she is appropriate for discharge with recommendation to consistently complete her HEP to assist in further reducing her back pain with patient verbalizing understanding. She demonstrates independence with home program and is appropriate for discharge at this time.    OBJECTIVE IMPAIRMENTS: decreased activity tolerance, decreased endurance, decreased knowledge of condition, difficulty walking, decreased strength, increased fascial restrictions, impaired flexibility, improper body mechanics, postural dysfunction, and pain.    ACTIVITY LIMITATIONS: carrying, lifting, bending, sitting, standing, squatting, and locomotion level   PARTICIPATION LIMITATIONS: meal prep, cleaning, laundry, shopping, and school   PERSONAL FACTORS: Age, Fitness, Profession, and Time since onset of injury/illness/exacerbation are also affecting patient's functional outcome.    REHAB POTENTIAL: Good   CLINICAL DECISION MAKING: Stable/uncomplicated   EVALUATION COMPLEXITY: Low     GOALS: Goals reviewed with patient? Yes   SHORT TERM GOALS: Target date: 04/29/22   Patient will be independent and compliant  with initial HEP.    Baseline: issued at eval  Goal status: GOAL MET    2.  Patient will maintain plank on elbows with  proper form for at least 10 seconds to signify improvements in lumbopelvic stability.  Baseline: unable  Status: can hold 10 sec 04/30/22 Goal status: GOAL MET   3.  Patient will demonstrate proper bending/lifting mechanics to reduce stress on her back with lifting her daughter.  Baseline: aberrant mechanics  Status: normal squat mechanics 05/28/22 Goal status: met     LONG TERM GOALS: Target date: 05/20/22   Patient will demonstrate at least 4+/5 bilateral hip strength to improve stability about the chain with standing activity.  Baseline: see above  Goal status: not met   2.  Patient will be able to lift at least 20 lbs with proper form without an increase in back pain.  Baseline: aberrant mechanics, increased pain  Status: 05/14/22: min increased pain with lifting 5# from floor Status: 05/21/22: progressing lifting as appropriate Status: 06/25/22: cues for lifting of 5 lbs  Goal status: not met    3.  Patient will report pain as </= 4/10 to reduce her current functional limitations.  Baseline: see above  Status: 05/14/22: pain level 5/10 Status: 05/21/22: 9/10 at worst  Status: 06/25/22: 5/10  Goal status: nearly met    PLAN:   PT FREQUENCY: n/a   PT DURATION: n/a   PLANNED INTERVENTIONS: Therapeutic exercises, Therapeutic activity, Neuromuscular re-education, Balance training, Patient/Family education, Self Care, Dry Needling, Cryotherapy, Moist heat, Manual therapy, and Re-evaluation.   PLAN FOR NEXT SESSION: n/a   Samantha [place], PT, DPT, ATC 01/03/24 3:01 PM

## 2024-01-31 ENCOUNTER — Ambulatory Visit: Admitting: Neurology

## 2024-01-31 ENCOUNTER — Encounter: Payer: Self-pay | Admitting: Neurology

## 2024-01-31 VITALS — BP 125/79 | HR 72 | Ht 66.0 in | Wt 146.0 lb

## 2024-01-31 DIAGNOSIS — G932 Benign intracranial hypertension: Secondary | ICD-10-CM | POA: Diagnosis not present

## 2024-01-31 NOTE — Progress Notes (Signed)
 NEUROLOGY FOLLOW UP OFFICE NOTE  Jacqueline Miranda [id number] Mar 01, 2001  HISTORY OF PRESENT ILLNESS: I had the pleasure of seeing Jacqueline Miranda in follow-up in the neurology clinic on 01/31/2024.  The patient was last seen 4 months ago for Idiopathic Intracranial Hypertension (pseudotumor cerebri) and syncope. She is alone in the office today. An Arabic medical interpreter Steffan helps with the visit.  Records and images were personally reviewed where available.  On her last visit, she had stopped Diamox  but was reporting vision being off sometimes and headaches 1-2 times a week with dizziness. She was advised to restart Diamox  250mg  BID. On her visit with Dr. Lorilee on 6/23, she reported Diamox  was causing low back pain. She endorses low back pain that is worse after she takes the Diamox . Dose reduced to 250mg  every morning. She states when she stops the Diamox , back is not as painful. She had seen her eye doctor and was told pressure was good, however she reports her vision is getting worse. She cannot see people's faces with her left eye. She reports eye pain, itching, and tearing. Left eye is red. She sees a cornea specialist on 9/9 (Dr. Leane with [facility]). The headaches are better, not like before. She has headaches once a week, taking Ibuprofen  for eye pain and headaches. She continues to have bilateral tinnitus. She is frustrated that her eye symptoms are not better while in [place] she recalls being given pills for 1 week and eye drops in 2 weeks with improvement. No falls. No further syncope since July 2024.   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 She speaks some [place], the interpreter translates in Arabic  as well.   She reports that she has had only one full episode of loss of consciousness that occurred in July. She was having a headache and recalls going up the stairs. She felt dizzy then woke up on the floor. Her mother found her, no convulsive activity reported. No tongue bite or incontinence. She woke up with the headache and dizziness. She did not seek care that time. She then reports another episode of near syncope in August, she again had a headache and dizziness, she sat down and repeatedly says she did not pass out this time. It took more than an hour to feel better. No further similar instances since then, however she continues to report headache and dizziness. These started after she delivered by baby girl 2.5 years ago. She states she has been staying away from sugar recently and she feels better, with headaches occurring around 2 days a week. Sometimes they occur once a week, other times they last all week. There is no nausea/vomiting but she spits a lot. She is sensitive to lights with them. Sometimes she cannot see clearly, L>R right eye. She takes Celecoxib  which helps. When she has a headache, she has dizziness where she feels like she cannot control her body. She demonstrates slight hand tremors that occur with them. No spinning sensation. She has constant numbness in both hands and feet, sometimes the numbness moves up her right arm. She has neck and back pain. No bowel/bladder dysfunction.  She started having left eye issues in 2021 and saw a doctor out of state who told her she had a  virus and prescribed medication which made her feel better. She then moved to [place] 2 weeks later and since then eye pain has recurred. Sometimes there is itching. She sometimes notices ?tinnitus on the right ear when lying on her right side, sometimes it is pulsatile or a low-pitched sound. No dysarthria/dysphagia, bowel/bladder dysfunction. Her mother has headaches. No family history of syncope. She denies any  recent head injuries. She lives with 8 people in their home. She is unemployed, studying for her GED. She is driving.   I personally reviewed brain MRI with and without contrast done 02/22/23 which did not show any acute changes. 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  Lumbar puncture in 04/2023 showed an elevated opening pressure of 30 cmH2O.   MRI lumbar spine 01/2023 without contrast normal. PAST MEDICAL HISTORY: Past Medical History:  Diagnosis Date   Anemia    Anemia in pregnancy 06/28/2020   Rx iron     Toothache 04/21/2020    MEDICATIONS: Current Outpatient Medications on File Prior to Visit  Medication Sig Dispense Refill   acetaZOLAMIDE  (DIAMOX ) 250 MG tablet Take 1 tablet twice a day 180 tablet 3   lidocaine -prilocaine  (EMLA ) cream Apply 1 Application topically as needed. 30 g 0   No current facility-administered medications on file prior to visit.    ALLERGIES: No Known Allergies  FAMILY HISTORY: Family History  Problem Relation Age of Onset   Hepatitis B Father    Asthma Mother     SOCIAL HISTORY: Social History   Socioeconomic History   Marital status: Single    Spouse name: Not on file   Number of children: 1   Years of education: Not on file   Highest education level: GED or equivalent  Occupational History   Not on file  Tobacco Use   Smoking status: Never   Smokeless tobacco: Never  Vaping Use   Vaping status: Never Used  Substance and Sexual Activity   Alcohol use: Never   Drug use: Never   Sexual activity: Not Currently  Other Topics Concern   Not on file  Social History Narrative   Father is Arob Scientist, physiological   Mother is [employer] Care[employer] information officer       Refugee Information   Number of Immediate Family Members: 8   Number of Immediate Family Members in [place] : 8   Date of Arrival: 02/12/20   Country of Birth: [place]   Other Country  of Origin:: [place]   Location of Refugee [place]: Other   Other Location of Refugee Camp:: [place]   Duration in [place]: 11-15 years   Reason for Leaving Home Country: Religion   Primary Language: Other   Other Primary Language:: Dinka   Able to Read in Primary Language: No   Able to Write in Primary Language: No   Education: Primary School   Marital Status: Single   Tuberculosis Screening Overseas: Negative   Health Department Labs Completed: Yes   Do You Feel Jumpy or Nervous?: No   Are You Very Watchful or 'Super Alert'?: No   Social Drivers of Health   Financial Resource Strain: High Risk (02/17/2023)   Overall Financial Resource Strain (CARDIA)    Difficulty of Paying Living Expenses: Hard  Food Insecurity: No Food Insecurity (02/17/2023)   Hunger Vital Sign    Worried About Running Out of Food in the Last Year: Never true    Ran Out  of Food in the Last Year: Never true  Transportation Needs: Unmet Transportation Needs (02/17/2023)   PRAPARE - Transportation    Lack of Transportation (Medical): No    Lack of Transportation (Non-Medical): Yes  Physical Activity: Insufficiently Active (02/17/2023)   Exercise Vital Sign    Days of Exercise per Week: 3 days    Minutes of Exercise per Session: 30 min  Stress: No Stress Concern Present (02/17/2023)   [employer] of Occupational Health - Occupational Stress Questionnaire    Feeling of Stress : Not at all  Social Connections: Socially Integrated (02/17/2023)   Social Connection and Isolation Panel    Frequency of Communication with Friends and Family: More than three times a week    Frequency of Social Gatherings with Friends and Family: Twice a week    Attends Religious Services: More than 4 times per year    Active Member of [employer] or Organizations: Yes    Attends Engineer, structural: More than 4 times per year    Marital Status: Living with partner  Intimate Partner Violence: Not on file     PHYSICAL EXAM: Vitals:    01/31/24 1530  BP: 125/79  Pulse: 72  SpO2: 97%   General: No acute distress Head:  Normocephalic/atraumatic Skin/Extremities: No rash, no edema Neurological Exam: alert and awake. No aphasia or dysarthria. Fund of knowledge is appropriate.  Attention and concentration are normal.   Cranial nerves: Pupils equal, round. Extraocular movements intact with no nystagmus. Visual fields full.  No facial asymmetry.  Motor: Bulk and tone normal, muscle strength 5/5 throughout with no pronator drift.   Finger to nose testing intact.  Gait narrow-based and steady, able to tandem walk adequately.  Romberg negative.   IMPRESSION: This is a pleasant 23 yo RH woman with a history of anemia, with idiopathic intracranial hypertension (IIH) and syncope. 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 Lumbar puncture showed elevated opening pressure of 30 cmH2O. Ophthalmology confirmed bilateral mild papilledema that resolved on last eye exam 07/2023 after increase in Diamox  dose. She reports back pain worsening with Diamox . She states her last eye exam did not show papilledema and headaches are better. We agreed to stop Diamox , she has an evaluation with a cornea specialist for left eye symptoms on 9/9. If there is note of papilledema, we will plan to start Topiramate instead. No further syncope since 11/2022. Follow-up in 4 months, call for any changes.   Thank you for allowing me to participate in her care.  Please do not hesitate to call for any questions or concerns.    Darice Shivers, M.D.   CC: Dr. Jesus

## 2024-01-31 NOTE — Patient Instructions (Signed)
 Good to see you.  Stop the Acetazolamide . Proceed with appointment with new eye doctor on September 9. If there is no increase in eye pressure (called papilledema), we can stay off medication. If there is increased pressure, please call our office and we will plan to start a different medication to reduce pressure  2. Please ask Dr. Leane to send notes to our office  3. Follow-up in 4 months, call for any changes

## 2024-02-21 ENCOUNTER — Encounter: Attending: Physical Medicine and Rehabilitation | Admitting: Physical Medicine and Rehabilitation

## 2024-02-21 ENCOUNTER — Encounter: Payer: Self-pay | Admitting: Physical Medicine and Rehabilitation

## 2024-02-21 VITALS — BP 99/63 | HR 67 | Ht 66.0 in | Wt 147.4 lb

## 2024-02-21 DIAGNOSIS — M545 Low back pain, unspecified: Secondary | ICD-10-CM | POA: Diagnosis not present

## 2024-02-21 DIAGNOSIS — G8929 Other chronic pain: Secondary | ICD-10-CM | POA: Diagnosis not present

## 2024-02-21 NOTE — Progress Notes (Signed)
 Subjective:    Patient ID: Jacqueline Miranda, female    DOB: 13-Oct-2000, 23 y.o.   MRN: [id number]  HPI Mrs. Jacqueline Miranda is 23 year old woman woman who presents for follow-up low back pain.  1) Chronic low back pain -nothing has helped so far -discussed that she just wants to monitor right now without intervention -she does not want refills -she does not want to PT -she notes this when she takes her medication for her high blood pressure, she says this is prescribed by neurology -pain is about the same -she is not taking any medications currently -started 2 years ago when she delivered her baby -went to gym and therapy and these did not help -she has tried tylenol  and this did not help -she has tried ibuprofen  and this did not help -has tried hot water and ice and these have helped -she has not tried tens unit  2) Neck pain -present since she delivered her baby  3) ICH: -she takes acetazolamide  -she follows with neurology  Pain Inventory Average Pain 8 Pain Right Now 5 My pain is stabbing and aching  In the last 24 hours, has pain interfered with the following? General activity 8 Relation with others 10 Enjoyment of life 5 What TIME of day is your pain at its worst? No specific time, pain varies throughout day and night Sleep (in general) Poor  Pain is worse with: walking, bending, sitting, inactivity, and standing, some activities Pain improves with: patient reports nothing helps with pain Relief from Meds: 0    Family History  Problem Relation Age of Onset   Hepatitis B Father    Asthma Mother    Social History   Socioeconomic History   Marital status: Single    Spouse name: Not on file   Number of children: 1   Years of education: Not on file   Highest education level: GED or equivalent  Occupational History   Not on file  Tobacco Use   Smoking status: Never   Smokeless tobacco: Never  Vaping Use   Vaping status: Never Used  Substance and Sexual Activity   Alcohol  use: Never   Drug use: Never   Sexual activity: Not Currently  Other Topics Concern   Not on file  Social History Narrative   Father is Arob Scientist, physiological   Mother is [employer] Care[employer] information officer       Refugee Information   Number of Immediate Family Members: 8   Number of Immediate Family Members in [place] : 8   Date of Arrival: 02/12/20   Country of Birth: [place]   Other Country of Origin:: [place]   Location of Refugee [place]: Other   Other Location of Refugee Camp:: [place]   Duration in [place]: 11-15 years   Reason for Leaving Home Country: Religion   Primary Language: Other   Other Primary Language:: Dinka   Able to Read in Primary Language: No   Able to Write in Primary Language: No   Education: Primary School   Marital Status: Single   Tuberculosis Screening Overseas: Negative   Health Department Labs Completed: Yes   Do You Feel Jumpy or Nervous?: No   Are You Very Watchful or 'Super Alert'?: No   Social Drivers of Health   Financial Resource Strain: High Risk (02/17/2023)   Overall Financial Resource Strain (CARDIA)    Difficulty of Paying Living Expenses: Hard  Food Insecurity: No Food Insecurity (02/17/2023)   Hunger Vital Sign    Worried About Running  Out of Food in the Last Year: Never true    Ran Out of Food in the Last Year: Never true  Transportation Needs: Unmet Transportation Needs (02/17/2023)   PRAPARE - Transportation    Lack of Transportation (Medical): No    Lack of Transportation (Non-Medical): Yes  Physical Activity: Insufficiently Active (02/17/2023)   Exercise Vital Sign    Days of Exercise per Week: 3 days    Minutes of Exercise per Session: 30 min  Stress: No Stress Concern Present (02/17/2023)   [employer] of Occupational Health - Occupational Stress Questionnaire    Feeling of Stress : Not at all  Social Connections: Socially Integrated (02/17/2023)   Social Connection and Isolation Panel    Frequency of Communication with Friends and Family: More than three times  a week    Frequency of Social Gatherings with Friends and Family: Twice a week    Attends Religious Services: More than 4 times per year    Active Member of Clubs or Organizations: Yes    Attends Engineer, structural: More than 4 times per year    Marital Status: Living with partner   Past Surgical History:  Procedure Laterality Date   TONSILLECTOMY     Past Medical History:  Diagnosis Date   Anemia    Anemia in pregnancy 06/28/2020   Rx iron     Toothache 04/21/2020   BP 99/63 (BP Location: Left Arm, Patient Position: Sitting, Cuff Size: Normal)   Pulse 67   Ht 5' 6 (1.676 m)   Wt 147 lb 6.4 oz (66.9 kg)   SpO2 (!) 67%   BMI 23.79 kg/m   Opioid Risk Score:   Fall Risk Score:  `1  Depression screen PHQ 2/9     02/21/2024    1:15 PM 11/21/2023    2:15 PM 07/15/2023    9:37 AM 06/10/2023    9:42 AM 05/03/2023    1:53 PM 03/04/2023    9:51 AM 02/18/2023    8:57 AM  Depression screen PHQ 2/9  Decreased Interest 0 0 0 0 0 0 0  Down, Depressed, Hopeless 0 0 0 0 2 0 0  PHQ - 2 Score 0 0 0 0 2 0 0  Altered sleeping    1 2 0 0  Tired, decreased energy    1 3 1 2   Change in appetite    0 0 0 0  Feeling bad or failure about yourself     0 0 0 0  Trouble concentrating    0 0 0 0  Moving slowly or fidgety/restless    0 0 0 0  Suicidal thoughts    0 0 0 0  PHQ-9 Score    2 7 1 2   Difficult doing work/chores    Somewhat difficult  Somewhat difficult Not difficult at all     Review of Systems  Constitutional:  Positive for unexpected weight change.       Gained weight  HENT: Negative.    Eyes: Negative.   Respiratory:  Positive for shortness of breath.   Cardiovascular: Negative.   Gastrointestinal:  Positive for constipation.  Endocrine: Negative.   Genitourinary: Negative.   Musculoskeletal:  Positive for back pain and neck pain.       Low back pain  Skin: Negative.   Allergic/Immunologic: Negative.   Neurological:  Positive for dizziness and weakness.   Hematological: Negative.   Psychiatric/Behavioral:  Positive for dysphoric mood.   All other systems  reviewed and are negative.      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 Discussed that this is a capsaicin patch, stronger than capsaicin cream. Discussed that it is currently approved for diabetic peripheral neuropathy and post-herpetic neuralgia, but that it has also shown benefit in treating other forms of neuropathy. Provided patient with link to site to learn more about the patch: https://www.clark.biz/. Discussed that the patch would be placed in office and benefits usually last 3 months. Discussed that unintended exposure to capsaicin can cause severe irritation of eyes, mucous membranes, respiratory tract, and skin, but that Qutenza is a local treatment and does not have the systemic side effects of other nerve medications. Discussed that there may be pain, itching, erythema, and decreased sensory function associated with the application of Qutenza. Side effects usually subside within 1 week. A cold pack of analgesic medications can help with these side effects. Blood pressure can also be  increased due to pain associated with administration of the patch.   -continue heat therapy, discussed that this vasodilates the blood vessels and sends increased flow to the back.  -gave topical lidocaine  cream -d/c advil , robaxin , celebrex  since she does not want oral medications -discussed that she would like a cream for back -discussed that she is not taking any medications at this time -discussed that she has had Xrs that showed no pathology -Discussed current symptoms of pain and history of pain.  -Discussed benefits of exercise in reducing pain. -Discussed following foods that may reduce pain: 1) Ginger (especially studied for arthritis)- reduce leukotriene production to decrease inflammation 2) Blueberries- high in phytonutrients that decrease inflammation 3) Salmon- marine omega-3s reduce joint swelling and pain 4) Pumpkin seeds- reduce inflammation 5) dark chocolate- reduces inflammation 6) turmeric- reduces inflammation 7) tart cherries - reduce pain and stiffness 8) extra virgin olive oil - its compound olecanthal helps to block prostaglandins  9) chili peppers- can be eaten or applied topically via capsaicin 10) mint- helpful for headache, muscle aches, joint pain, and itching 11) garlic- reduces inflammation  Link to further information on diet for chronic pain: http://www.bray.com/    2) Chronic neck pain -discussed XR -discussed that tens unit was too expensive for her  3) Intracranial hypertension -she has decreased acetazolamide  to 500mg  BID because it was causing her low back pain -discussed that she discussed above with Dr. Georjean who agreed with this decision.  -eye pain has been bothering her since she had to stop the acetazolamide  temporarily- it was stopped because she did not have any refills.  -she has an appointment with an eye doctor -messaged Dr. Georjean to see whether dose can be  decreased further

## 2024-02-21 NOTE — Patient Instructions (Signed)
 [facility]

## 2024-04-10 ENCOUNTER — Encounter: Payer: Self-pay | Admitting: Neurology

## 2024-06-11 ENCOUNTER — Ambulatory Visit: Admitting: Neurology

## 2024-06-11 NOTE — Progress Notes (Unsigned)
 "  NEUROLOGY FOLLOW UP OFFICE NOTE  Jacqueline Miranda [id number] Mar 11, 2001  HISTORY OF PRESENT ILLNESS: I had the pleasure of seeing Jacqueline Miranda in follow-up in the neurology clinic on 06/11/2024.  The patient was last seen 4 months ago for Idiopathic Intracranial Hypertension and syncope. An Arabic medical interpreter ***  helps with the visit. Records and images were personally reviewed where available.  Since her last visit, she has been seen by Ophthalmology at [facility] and diagnosed with left herpetic uveitis. No papilledema noted on exam.   I had the pleasure of seeing Jacqueline Miranda in follow-up in the neurology clinic on 01/31/2024.  The patient was last seen 4 months ago for Idiopathic Intracranial Hypertension (pseudotumor cerebri) and syncope. She is alone in the office today. An Arabic medical interpreter Steffan helps with the visit.  Records and images were personally reviewed where available.  On her last visit, she had stopped Diamox  but was reporting vision being off sometimes and headaches 1-2 times a week with dizziness. She was advised to restart Diamox  250mg  BID. On her visit with Dr. Lorilee on 6/23, she reported Diamox  was causing low back pain. She endorses low back pain that is worse after she takes the Diamox . Dose reduced to 250mg  every morning. She states when she stops the Diamox , back is not as painful. She had seen her eye doctor and was told pressure was good, however she reports her vision is getting worse. She cannot see people's faces with her left eye. She reports eye pain, itching, and tearing. Left eye is red. She sees a cornea specialist on 9/9 (Dr. Leane with [facility]). The headaches are better, not like before. She has headaches once a week, taking Ibuprofen  for eye pain and headaches. She continues to have bilateral tinnitus. She is frustrated that her eye symptoms are not better while in [place] she recalls being given pills for 1 week and eye drops in 2 weeks with  improvement. No falls. No further syncope since July 2024.   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 She speaks some English, the interpreter translates in Arabic as well.   She reports that she has had only one full episode of loss of consciousness that occurred in July. She was having a headache and recalls going up the stairs. She felt dizzy then woke up on the floor. Her mother found her, no convulsive activity reported. No tongue bite or incontinence. She woke up with the headache and dizziness. She did not seek care that time. She then reports another episode of near syncope in August, she again had a headache and dizziness, she sat down and repeatedly says she did not pass out this time. It took more than an hour to feel better. No further similar instances since then, however she continues to report headache and dizziness. These started after she delivered by baby girl 2.5 years ago. She states she has been staying away from sugar recently and she feels better, with headaches occurring around 2 days a week. Sometimes they occur once a week, other times they last all week. There is no nausea/vomiting but she spits a lot. She is sensitive to lights with them. Sometimes she cannot see clearly, L>R right eye. She takes Celecoxib  which helps.  When she has a headache, she has dizziness where she feels like she cannot control her body. She demonstrates slight hand tremors that occur with them. No spinning sensation. She has constant numbness in both hands and feet, sometimes the numbness moves up her right arm. She has neck and back pain. No bowel/bladder dysfunction.  She started having left eye issues in 2021 and saw a  doctor out of state who told her she had a virus and prescribed medication which made her feel better. She then moved to [place] 2 weeks later and since then eye pain has recurred. Sometimes there is itching. She sometimes notices ?tinnitus on the right ear when lying on her right side, sometimes it is pulsatile or a low-pitched sound. No dysarthria/dysphagia, bowel/bladder dysfunction. Her mother has headaches. No family history of syncope. She denies any recent head injuries. She lives with 8 people in their home. She is unemployed, studying for her GED. She is driving.   I personally reviewed brain MRI with and without contrast done 02/22/23 which did not show any acute changes. 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  Lumbar puncture in 04/2023 showed an elevated opening pressure of 30 cmH2O.   MRI lumbar spine 01/2023 without contrast normal.  PAST MEDICAL HISTORY: Past Medical History:  Diagnosis Date   Anemia    Anemia in pregnancy 06/28/2020   Rx iron     Toothache 04/21/2020    MEDICATIONS: Medications Ordered Prior to Encounter[1]  ALLERGIES: Allergies[2]  FAMILY HISTORY: Family History  Problem Relation Age of Onset   Hepatitis B Father    Asthma Mother     SOCIAL HISTORY: Social History   Socioeconomic History   Marital status: Single    Spouse name: Not on file   Number of children: 1   Years of education: Not on file   Highest education level: GED or equivalent  Occupational History   Not on file  Tobacco Use   Smoking status: Never   Smokeless tobacco: Never  Vaping Use   Vaping status: Never Used  Substance and Sexual Activity   Alcohol use: Never   Drug use: Never   Sexual activity: Not Currently  Other Topics Concern   Not on file  Social History Narrative   Father is Arob Scientist, Physiological   Mother is [employer] Care[employer] Information Officer       Refugee  Information   Number of Immediate Family Members: 8   Number of Immediate Family Members in [place] : 8   Date of Arrival: 02/12/20   Country of Birth: [place]   Other Country of Origin:: [place]   Location of Refugee [place]: Other   Other Location of Refugee Camp:: [place]   Duration in [place]: 11-15 years   Reason for Leaving Home Country: Religion   Primary Language: Other   Other Primary Language:: Dinka   Able to Read in Primary Language: No   Able to Write in Primary Language: No   Education: Primary School   Marital Status: Single   Tuberculosis Screening Overseas: Negative   Health Department Labs Completed: Yes   Do You Feel Jumpy or Nervous?: No   Are You Very Watchful or 'Super Alert'?: No   Social Drivers of Health   Tobacco Use: Low Risk (02/21/2024)   Patient History    Smoking Tobacco Use: Never    Smokeless Tobacco Use: Never    Passive Exposure: Not  on file  Financial Resource Strain: High Risk (02/17/2023)   Overall Financial Resource Strain (CARDIA)    Difficulty of Paying Living Expenses: Hard  Food Insecurity: No Food Insecurity (02/17/2023)   Hunger Vital Sign    Worried About Running Out of Food in the Last Year: Never true    Ran Out of Food in the Last Year: Never true  Transportation Needs: Unmet Transportation Needs (02/17/2023)   PRAPARE - Transportation    Lack of Transportation (Medical): No    Lack of Transportation (Non-Medical): Yes  Physical Activity: Insufficiently Active (02/17/2023)   Exercise Vital Sign    Days of Exercise per Week: 3 days    Minutes of Exercise per Session: 30 min  Stress: No Stress Concern Present (02/17/2023)   [employer] of Occupational Health - Occupational Stress Questionnaire    Feeling of Stress : Not at all  Social Connections: Socially Integrated (02/17/2023)   Social Connection and Isolation Panel    Frequency of Communication with Friends and Family: More than three times a week    Frequency of Social Gatherings with  Friends and Family: Twice a week    Attends Religious Services: More than 4 times per year    Active Member of [employer] or Organizations: Yes    Attends Banker Meetings: More than 4 times per year    Marital Status: Living with partner  Intimate Partner Violence: Not on file  Depression (PHQ2-9): Low Risk (02/21/2024)   Depression (PHQ2-9)    PHQ-2 Score: 0  Alcohol Screen: Not on file  Housing: Low Risk (02/17/2023)   Housing    Last Housing Risk Score: 0  Utilities: Not on file  Health Literacy: Not on file     PHYSICAL EXAM: There were no vitals filed for this visit. General: No acute distress Head:  Normocephalic/atraumatic Skin/Extremities: No rash, no edema Neurological Exam: alert and oriented to person, place, and time. No aphasia or dysarthria. Fund of knowledge is appropriate.  Recent and remote memory are intact.  Attention and concentration are normal.   Cranial nerves: Pupils equal, round. Extraocular movements intact with no nystagmus. Visual fields full.  No facial asymmetry.  Motor: Bulk and tone normal, muscle strength 5/5 throughout with no pronator drift.   Finger to nose testing intact.  Gait narrow-based and steady, able to tandem walk adequately.  Romberg negative.   IMPRESSION: This is a pleasant 24 yo RH woman with a history of anemia, with idiopathic intracranial hypertension (IIH) and syncope. 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 Lumbar puncture showed elevated opening pressure of 30 cmH2O. Ophthalmology confirmed bilateral mild papilledema that resolved on last eye exam 07/2023 after increase in Diamox  dose. She reports back pain worsening with Diamox . She states her last eye exam did not show papilledema and headaches are better. We agreed to stop Diamox , she has an evaluation with a cornea specialist for left eye symptoms on 9/9. If there  is note of papilledema, we will plan to start Topiramate instead. No further syncope since 11/2022. Follow-up in 4 months, call for any changes.     Thank you for allowing me to participate in *** care.  Please do not hesitate to call for any questions or concerns.  The duration of this appointment visit was *** minutes of face-to-face time with the patient.  Greater than 50% of this time was spent in counseling, explanation of diagnosis,  planning of further management, and coordination of care.   Darice Shivers, M.D.   CC: ***      [1]  Current Outpatient Medications on File Prior to Visit  Medication Sig Dispense Refill   lidocaine -prilocaine  (EMLA ) cream Apply 1 Application topically as needed. 30 g 0   Polyvinyl Alcohol-Povidone (REFRESH OP) Apply to eye.     sodium chloride  (MURO 128) 2 % ophthalmic solution Place 1 drop into the left eye.     No current facility-administered medications on file prior to visit.  [2] No Known Allergies  "
